# Patient Record
Sex: Female | Born: 1975 | Race: White | Hispanic: No | Marital: Single | Smoking: Never smoker
Health system: Southern US, Community
[De-identification: ages and names within clinical notes are randomized; demographics above are authoritative.]

## PROBLEM LIST (undated history)

## (undated) VITALS — BP 109/79 | HR 93 | Temp 98.0°F | Resp 16 | Ht 63.0 in | Wt 174.2 lb

## (undated) DIAGNOSIS — K7689 Other specified diseases of liver: Secondary | ICD-10-CM

## (undated) DIAGNOSIS — M1990 Unspecified osteoarthritis, unspecified site: Secondary | ICD-10-CM

## (undated) DIAGNOSIS — R51 Headache: Secondary | ICD-10-CM

## (undated) DIAGNOSIS — R569 Unspecified convulsions: Secondary | ICD-10-CM

## (undated) DIAGNOSIS — Z8041 Family history of malignant neoplasm of ovary: Secondary | ICD-10-CM

## (undated) DIAGNOSIS — G43909 Migraine, unspecified, not intractable, without status migrainosus: Secondary | ICD-10-CM

## (undated) DIAGNOSIS — F4310 Post-traumatic stress disorder, unspecified: Secondary | ICD-10-CM

## (undated) DIAGNOSIS — Z800 Family history of malignant neoplasm of digestive organs: Secondary | ICD-10-CM

## (undated) DIAGNOSIS — F419 Anxiety disorder, unspecified: Secondary | ICD-10-CM

## (undated) DIAGNOSIS — E079 Disorder of thyroid, unspecified: Secondary | ICD-10-CM

## (undated) DIAGNOSIS — S069X9A Unspecified intracranial injury with loss of consciousness of unspecified duration, initial encounter: Secondary | ICD-10-CM

## (undated) DIAGNOSIS — F319 Bipolar disorder, unspecified: Secondary | ICD-10-CM

## (undated) DIAGNOSIS — M810 Age-related osteoporosis without current pathological fracture: Secondary | ICD-10-CM

## (undated) DIAGNOSIS — I10 Essential (primary) hypertension: Secondary | ICD-10-CM

## (undated) DIAGNOSIS — G4700 Insomnia, unspecified: Secondary | ICD-10-CM

## (undated) DIAGNOSIS — G40909 Epilepsy, unspecified, not intractable, without status epilepticus: Secondary | ICD-10-CM

## (undated) DIAGNOSIS — K760 Fatty (change of) liver, not elsewhere classified: Secondary | ICD-10-CM

## (undated) DIAGNOSIS — Z87442 Personal history of urinary calculi: Secondary | ICD-10-CM

## (undated) DIAGNOSIS — G894 Chronic pain syndrome: Secondary | ICD-10-CM

## (undated) DIAGNOSIS — S069XAA Unspecified intracranial injury with loss of consciousness status unknown, initial encounter: Secondary | ICD-10-CM

## (undated) DIAGNOSIS — Z801 Family history of malignant neoplasm of trachea, bronchus and lung: Secondary | ICD-10-CM

## (undated) DIAGNOSIS — N6022 Fibroadenosis of left breast: Secondary | ICD-10-CM

## (undated) DIAGNOSIS — Z8719 Personal history of other diseases of the digestive system: Secondary | ICD-10-CM

## (undated) DIAGNOSIS — M797 Fibromyalgia: Secondary | ICD-10-CM

## (undated) DIAGNOSIS — G4730 Sleep apnea, unspecified: Secondary | ICD-10-CM

## (undated) DIAGNOSIS — K219 Gastro-esophageal reflux disease without esophagitis: Secondary | ICD-10-CM

## (undated) DIAGNOSIS — R87629 Unspecified abnormal cytological findings in specimens from vagina: Secondary | ICD-10-CM

## (undated) DIAGNOSIS — R519 Headache, unspecified: Secondary | ICD-10-CM

## (undated) HISTORY — PX: HEAD HARDWARE REMOVAL: SUR1126

## (undated) HISTORY — DX: Anxiety disorder, unspecified: F41.9

## (undated) HISTORY — DX: Migraine, unspecified, not intractable, without status migrainosus: G43.909

## (undated) HISTORY — PX: OTHER SURGICAL HISTORY: SHX169

## (undated) HISTORY — DX: Fibromyalgia: M79.7

## (undated) HISTORY — DX: Bipolar disorder, unspecified: F31.9

## (undated) HISTORY — DX: Post-traumatic stress disorder, unspecified: F43.10

## (undated) HISTORY — DX: Insomnia, unspecified: G47.00

## (undated) HISTORY — PX: TUBAL LIGATION: SHX77

## (undated) HISTORY — DX: Headache: R51

## (undated) HISTORY — DX: Family history of malignant neoplasm of trachea, bronchus and lung: Z80.1

## (undated) HISTORY — DX: Unspecified abnormal cytological findings in specimens from vagina: R87.629

## (undated) HISTORY — DX: Family history of malignant neoplasm of digestive organs: Z80.0

## (undated) HISTORY — DX: Family history of malignant neoplasm of ovary: Z80.41

## (undated) HISTORY — PX: WISDOM TOOTH EXTRACTION: SHX21

## (undated) HISTORY — DX: Chronic pain syndrome: G89.4

## (undated) HISTORY — DX: Other specified diseases of liver: K76.89

## (undated) HISTORY — DX: Fatty (change of) liver, not elsewhere classified: K76.0

## (undated) HISTORY — PX: DILATION AND CURETTAGE OF UTERUS: SHX78

## (undated) HISTORY — DX: Fibroadenosis of left breast: N60.22

## (undated) HISTORY — DX: Headache, unspecified: R51.9

## (undated) HISTORY — PX: BREAST SURGERY: SHX581

## (undated) HISTORY — PX: ABDOMINAL HYSTERECTOMY: SHX81

---

## 1997-07-19 ENCOUNTER — Other Ambulatory Visit: Admission: RE | Admit: 1997-07-19 | Discharge: 1997-07-19 | Payer: Self-pay | Admitting: Obstetrics & Gynecology

## 1997-10-29 ENCOUNTER — Inpatient Hospital Stay (HOSPITAL_COMMUNITY): Admission: AD | Admit: 1997-10-29 | Discharge: 1997-10-31 | Payer: Self-pay | Admitting: Obstetrics and Gynecology

## 1997-11-07 ENCOUNTER — Encounter (HOSPITAL_COMMUNITY): Admission: RE | Admit: 1997-11-07 | Discharge: 1998-02-05 | Payer: Self-pay | Admitting: Obstetrics and Gynecology

## 1998-06-18 ENCOUNTER — Inpatient Hospital Stay (HOSPITAL_COMMUNITY): Admission: AD | Admit: 1998-06-18 | Discharge: 1998-06-18 | Payer: Self-pay | Admitting: Obstetrics & Gynecology

## 1998-06-19 ENCOUNTER — Encounter: Payer: Self-pay | Admitting: Obstetrics & Gynecology

## 1998-06-24 ENCOUNTER — Inpatient Hospital Stay (HOSPITAL_COMMUNITY): Admission: AD | Admit: 1998-06-24 | Discharge: 1998-06-24 | Payer: Self-pay | Admitting: Obstetrics and Gynecology

## 1998-06-25 ENCOUNTER — Other Ambulatory Visit: Admission: RE | Admit: 1998-06-25 | Discharge: 1998-06-25 | Payer: Self-pay | Admitting: Obstetrics and Gynecology

## 1998-10-06 ENCOUNTER — Inpatient Hospital Stay (HOSPITAL_COMMUNITY): Admission: AD | Admit: 1998-10-06 | Discharge: 1998-10-06 | Payer: Self-pay | Admitting: Obstetrics and Gynecology

## 1998-12-16 ENCOUNTER — Inpatient Hospital Stay (HOSPITAL_COMMUNITY): Admission: AD | Admit: 1998-12-16 | Discharge: 1998-12-18 | Payer: Self-pay | Admitting: Obstetrics and Gynecology

## 1998-12-18 ENCOUNTER — Encounter: Payer: Self-pay | Admitting: Obstetrics & Gynecology

## 1998-12-27 ENCOUNTER — Inpatient Hospital Stay (HOSPITAL_COMMUNITY): Admission: AD | Admit: 1998-12-27 | Discharge: 1998-12-27 | Payer: Self-pay | Admitting: Obstetrics and Gynecology

## 1999-01-07 ENCOUNTER — Inpatient Hospital Stay (HOSPITAL_COMMUNITY): Admission: AD | Admit: 1999-01-07 | Discharge: 1999-01-09 | Payer: Self-pay | Admitting: Obstetrics and Gynecology

## 1999-01-08 ENCOUNTER — Encounter: Payer: Self-pay | Admitting: Obstetrics & Gynecology

## 1999-01-16 ENCOUNTER — Observation Stay (HOSPITAL_COMMUNITY): Admission: AD | Admit: 1999-01-16 | Discharge: 1999-01-17 | Payer: Self-pay | Admitting: Obstetrics & Gynecology

## 1999-01-27 ENCOUNTER — Inpatient Hospital Stay (HOSPITAL_COMMUNITY): Admission: AD | Admit: 1999-01-27 | Discharge: 1999-01-29 | Payer: Self-pay | Admitting: Obstetrics and Gynecology

## 1999-05-27 ENCOUNTER — Ambulatory Visit (HOSPITAL_COMMUNITY): Admission: RE | Admit: 1999-05-27 | Discharge: 1999-05-27 | Payer: Self-pay | Admitting: Obstetrics and Gynecology

## 2000-09-22 ENCOUNTER — Other Ambulatory Visit: Admission: RE | Admit: 2000-09-22 | Discharge: 2000-09-22 | Payer: Self-pay | Admitting: Obstetrics and Gynecology

## 2000-12-28 ENCOUNTER — Inpatient Hospital Stay (HOSPITAL_COMMUNITY): Admission: AD | Admit: 2000-12-28 | Discharge: 2000-12-30 | Payer: Self-pay | Admitting: Psychiatry

## 2001-11-25 ENCOUNTER — Other Ambulatory Visit: Admission: RE | Admit: 2001-11-25 | Discharge: 2001-11-25 | Payer: Self-pay | Admitting: Obstetrics and Gynecology

## 2004-01-25 ENCOUNTER — Other Ambulatory Visit: Admission: RE | Admit: 2004-01-25 | Discharge: 2004-01-25 | Payer: Self-pay | Admitting: Obstetrics and Gynecology

## 2004-04-27 ENCOUNTER — Emergency Department (HOSPITAL_COMMUNITY): Admission: EM | Admit: 2004-04-27 | Discharge: 2004-04-27 | Payer: Self-pay | Admitting: Emergency Medicine

## 2005-06-22 ENCOUNTER — Encounter: Admission: RE | Admit: 2005-06-22 | Discharge: 2005-06-22 | Payer: Self-pay | Admitting: Family Medicine

## 2005-07-08 ENCOUNTER — Encounter: Admission: RE | Admit: 2005-07-08 | Discharge: 2005-07-08 | Payer: Self-pay | Admitting: Family Medicine

## 2006-04-29 ENCOUNTER — Emergency Department (HOSPITAL_COMMUNITY): Admission: EM | Admit: 2006-04-29 | Discharge: 2006-04-29 | Payer: Self-pay | Admitting: Emergency Medicine

## 2006-06-16 ENCOUNTER — Ambulatory Visit: Payer: Self-pay | Admitting: Oncology

## 2007-01-05 ENCOUNTER — Emergency Department (HOSPITAL_COMMUNITY): Admission: EM | Admit: 2007-01-05 | Discharge: 2007-01-05 | Payer: Self-pay | Admitting: Emergency Medicine

## 2007-01-17 ENCOUNTER — Encounter: Admission: RE | Admit: 2007-01-17 | Discharge: 2007-01-17 | Payer: Self-pay | Admitting: Family Medicine

## 2007-01-31 ENCOUNTER — Ambulatory Visit: Payer: Self-pay | Admitting: Internal Medicine

## 2007-01-31 LAB — CONVERTED CEMR LAB
Amylase: 52 units/L (ref 27–131)
Lipase: 31 units/L (ref 11.0–59.0)
Sed Rate: 5 mm/hr (ref 0–25)

## 2007-03-08 ENCOUNTER — Ambulatory Visit: Payer: Self-pay | Admitting: Internal Medicine

## 2007-03-08 ENCOUNTER — Encounter: Payer: Self-pay | Admitting: Internal Medicine

## 2007-07-21 ENCOUNTER — Emergency Department (HOSPITAL_COMMUNITY): Admission: EM | Admit: 2007-07-21 | Discharge: 2007-07-21 | Payer: Self-pay | Admitting: Family Medicine

## 2007-08-23 ENCOUNTER — Emergency Department (HOSPITAL_COMMUNITY): Admission: EM | Admit: 2007-08-23 | Discharge: 2007-08-23 | Payer: Self-pay | Admitting: Emergency Medicine

## 2008-06-11 ENCOUNTER — Inpatient Hospital Stay (HOSPITAL_COMMUNITY): Admission: AD | Admit: 2008-06-11 | Discharge: 2008-06-12 | Payer: Self-pay | Admitting: Obstetrics & Gynecology

## 2008-06-11 ENCOUNTER — Emergency Department (HOSPITAL_COMMUNITY): Admission: EM | Admit: 2008-06-11 | Discharge: 2008-06-11 | Payer: Self-pay | Admitting: Emergency Medicine

## 2008-09-25 ENCOUNTER — Emergency Department (HOSPITAL_COMMUNITY): Admission: EM | Admit: 2008-09-25 | Discharge: 2008-09-25 | Payer: Self-pay | Admitting: Family Medicine

## 2008-10-29 ENCOUNTER — Emergency Department (HOSPITAL_COMMUNITY): Admission: EM | Admit: 2008-10-29 | Discharge: 2008-10-29 | Payer: Self-pay | Admitting: Emergency Medicine

## 2008-10-29 ENCOUNTER — Emergency Department (HOSPITAL_COMMUNITY): Admission: EM | Admit: 2008-10-29 | Discharge: 2008-10-30 | Payer: Self-pay | Admitting: Emergency Medicine

## 2008-12-31 ENCOUNTER — Ambulatory Visit: Payer: Self-pay

## 2008-12-31 ENCOUNTER — Encounter: Payer: Self-pay | Admitting: Cardiology

## 2009-01-04 ENCOUNTER — Inpatient Hospital Stay (HOSPITAL_COMMUNITY): Admission: RE | Admit: 2009-01-04 | Discharge: 2009-01-05 | Payer: Self-pay | Admitting: Neurosurgery

## 2009-01-04 ENCOUNTER — Telehealth (INDEPENDENT_AMBULATORY_CARE_PROVIDER_SITE_OTHER): Payer: Self-pay | Admitting: *Deleted

## 2009-01-05 ENCOUNTER — Encounter (INDEPENDENT_AMBULATORY_CARE_PROVIDER_SITE_OTHER): Payer: Self-pay | Admitting: Neurosurgery

## 2010-02-16 HISTORY — PX: OTHER SURGICAL HISTORY: SHX169

## 2010-05-21 LAB — BASIC METABOLIC PANEL
BUN: 9 mg/dL (ref 6–23)
CO2: 25 mEq/L (ref 19–32)
GFR calc non Af Amer: >60 mL/min (ref >60)
Potassium: 4 mEq/L (ref 3.5–5.1)
Sodium: 136 mEq/L (ref 135–145)

## 2010-05-21 LAB — CBC
Hemoglobin: 14.7 g/dL (ref 12.0–15.0)
RBC: 4.41 MIL/uL (ref 3.87–5.11)
RDW: 12.1 % (ref 11.5–15.5)

## 2010-05-23 LAB — VALPROIC ACID LEVEL: Valproic Acid Lvl: <10 ug/mL — ABNORMAL LOW (ref 50.0–100.0)

## 2010-05-24 LAB — POCT URINALYSIS DIP (DEVICE)
Glucose, UA: NEGATIVE mg/dL
Ketones, ur: NEGATIVE mg/dL
Nitrite: NEGATIVE

## 2010-05-24 LAB — POCT PREGNANCY, URINE: Preg Test, Ur: NEGATIVE

## 2010-05-24 LAB — URINE CULTURE

## 2010-05-28 LAB — GC/CHLAMYDIA PROBE AMP, GENITAL
Chlamydia, DNA Probe: NEGATIVE
GC Probe Amp, Genital: NEGATIVE

## 2010-05-28 LAB — URINALYSIS, ROUTINE W REFLEX MICROSCOPIC
Bilirubin Urine: NEGATIVE
Glucose, UA: NEGATIVE mg/dL
Leukocytes, UA: NEGATIVE
Specific Gravity, Urine: >1.03 — ABNORMAL HIGH (ref 1.005–1.030)
Urobilinogen, UA: 0.2 mg/dL (ref 0.0–1.0)
pH: 5.5 (ref 5.0–8.0)

## 2010-05-28 LAB — CBC
HCT: 41.6 % (ref 36.0–46.0)
Platelets: 210 10*3/uL (ref 150–400)
RBC: 4.36 MIL/uL (ref 3.87–5.11)
WBC: 11.7 10*3/uL — ABNORMAL HIGH (ref 4.0–10.5)

## 2010-05-28 LAB — POCT URINALYSIS DIP (DEVICE)
Ketones, ur: NEGATIVE mg/dL
Nitrite: NEGATIVE
Protein, ur: 30 mg/dL — AB
Specific Gravity, Urine: 1.025 (ref 1.005–1.030)

## 2010-05-28 LAB — WET PREP, GENITAL
Clue Cells Wet Prep HPF POC: NONE SEEN
Yeast Wet Prep HPF POC: NONE SEEN

## 2010-05-28 LAB — DIFFERENTIAL
Basophils Absolute: 0 10*3/uL (ref 0.0–0.1)
Eosinophils Relative: 1 % (ref 0–5)
Lymphocytes Relative: 28 % (ref 12–46)

## 2010-05-28 LAB — BASIC METABOLIC PANEL
Calcium: 9.5 mg/dL (ref 8.4–10.5)
Creatinine, Ser: 0.72 mg/dL (ref 0.4–1.2)
GFR calc Af Amer: >60 mL/min (ref >60)
GFR calc non Af Amer: >60 mL/min (ref >60)
Potassium: 3.9 mEq/L (ref 3.5–5.1)

## 2010-05-28 LAB — POCT PREGNANCY, URINE: Preg Test, Ur: NEGATIVE

## 2010-05-28 LAB — URINE MICROSCOPIC-ADD ON

## 2010-07-01 NOTE — Assessment & Plan Note (Signed)
[facility]                         GASTROENTEROLOGY OFFICE NOTE   NAME:STEPHENSCharlisha, Market                   MRN:          [id number]  DATE:01/31/2007                            DOB:          Apr 24, 1975    REFERRING PHYSICIAN:  Corky Mull   GASTROENTEROLOGY CONSULTATION   PROBLEM:  Epigastric pain, alternating bowel habits, nausea and  bloating.   HISTORY OF PRESENT ILLNESS:  Malayshia is a 35 year old white female  generally in good health.  She does have history of migraines and  bipolar disorder.  She is status post bilateral tubal ligation in 2000.  The patient has not had any previous gastrointestinal evaluation.  She  states that she has had GI problems intermittently over the past 8  years.  She said her difficulty starting in 2000 when she had an episode  with a lot of abdominal discomfort and mucous stools and some small  amounts of bright red blood per rectum.  She says intermittently over  the years she has had alternating diarrhea and constipation and  occasionally sees a small amount of bright red blood mixed in with her  bowel movements.  About three weeks ago, she said she had not been  bothered over the past year, she began having epigastric discomfort,  which she describes as stabbing and fairly severe.  This took her to the  emergency room where she was evaluated and released.  She has since been  to her primary care Esten Dollar and says that she continues to have the  epigastric discomfort, feels bloated and uncomfortable and feels as if  her food is not digesting.  She had labs drawn, CBC and CMET were  negative.  An H Pylori was done and was weakly positive and she is now  on a regimen of Flagyl, Biaxin and Prevacid b.i.d. which she started  three days ago.  She says she feels more nauseated with these  medications.  She, at this time, is not having chronic ongoing problems  with diarrhea but rather  alternating bowel habits.  She  had seen bright  red blood in her stools over the past couple of weeks but none over the  past few days.  She has actually gained weight since October of 2008.  The patient is on Depakote but has not had any recent changes in her  dosage.   A recent abdominal ultrasound December, 2008 was negative.   CURRENT MEDICATIONS:  1. Depakote 500 mg two p.o. q.h.s.  2. Phenergan 25 mg q.6h. p.r.n.  3. Flagyl 250, two p.o. b.i.d. x 14 days.  4. Prevacid 30 b.i.d. x 14 days.  5. Clarithromycin 500 mg one p.o. b.i.d. x 14 days.   ALLERGIES:  PENICILLIN and MORPHINE.   PAST MEDICAL HISTORY:  As outlined above.   SOCIAL HISTORY:  The patient has two sons.  She is currently separated  and living with her parents.  She is an Systems developer.  She is a  nonsmoker, nondrinker.   FAMILY HISTORY:  Positive for ovarian cancer in grandmother, mother and  aunt.  Alcoholism in her mother.   REVIEW  OF SYSTEMS:  Pertinent for urinary frequency, low back pain, she  says that she does have difficulty with sleeping she relates to her  depression.   PHYSICAL EXAMINATION:  GENERAL APPEARANCE:  Well-developed, white female  in no acute distress.  VITAL SIGNS:  Height is 5 feet, 4 inches, weight is 151.  Blood pressure  108/72.  Pulse is 68.  HEENT:  Nontraumatic, normocephalic.  EOMI. PERRLA.  Sclerae anicteric.  NECK:  Supple.  CARDIOVASCULAR:  Regular rate and rhythm with S1, S2.  No murmurs, rubs  or gallops.  PULMONARY:  Clear to auscultation and percussion.  ABDOMEN: Soft.  She is tender in the epigastrium, right upper quadrant,  right mid quadrant.  There is no palpable mass or hepatosplenomegaly.  No guarding or rebound.  RECTAL:  Exam is Hemoccult negative without mass.  Formed stool in the  rectal vault.   IMPRESSION:  This is a 35 year old white female with three week history  of epigastric pain associated with cramping, nausea and a long history  of intermittent upper abdominal pain,  diarrhea and hematochezia.  Her  symptoms may all be due to irritable bowel syndrome, though need to rule  out inflammatory bowel disease, Celiac disease, also would consider  pancreatitis currently since she is on Depakote, though recent  ultrasound did not show any inflammatory changes.   PLAN:  1. Check labs, sed rate, amylase, lipase and Celiac panel today.  2. Advised her to finish her current course of H. Pylori therapy,      though do not feel that H. Pylori accounts for the majority of her      symptoms.  She was also given samples of Prevacid to continue once      she completes the two week H. Pylori regimen.  3. Schedule upper endoscopy and colonoscopy with Dr. Juanda Chance to help      clarify her diagnosis.      Mike Gip, PA-C  Electronically Signed      Hedwig Morton. Juanda Chance, MD  Electronically Signed   AE/MedQ  DD: 01/31/2007  DT: 02/01/2007  Job #: [id number]

## 2010-07-04 NOTE — Op Note (Signed)
[facility]  Patient:    Kristin Braun, Kristin Braun                      MRN: [id number] Proc. Date: 05/27/99 Adm. Date:  [id number] Attending:  Osborn Coho                           Operative Report  PREOPERATIVE DIAGNOSIS:       The patient desires permanent sterilization.  POSTOPERATIVE DIAGNOSIS:      The patient desires permanent sterilization.  OPERATION:                    Laparoscopic bilateral tubal ligation, application of Hulka clips.  SURGEON:                      Mark E. Dareen Piano, M.D.  ASSISTANT:  ANESTHESIA:                   General endotracheal anesthesia.  ANTIBIOTICS:                  None.  DRAINS:                       Red rubber catheter to bladder.  ESTIMATED BLOOD LOSS:         Minimal.  COMPLICATIONS:                None.  DESCRIPTION OF PROCEDURE:     The patient was taken to the operating room where a general anesthesia was administered without complications.  She was then placed in the dorsal lithotomy position and prepped with Hibiclens.  Her bladder was drained with a red rubber catheter.  A Hulka tenaculum was applied to the anterior cervical lip.  6 cc of 0.25% Marcaine was injected into the infraumbilical region.  A vertical skin incision was made.  The Veress needle was placed in the peritoneal cavity and 3 liters of carbon dioxide was insufflated.  The 12 mm trocar was then placed in the peritoneal cavity and the scope placed in the abdominal cavity. There problems with the camera.  I could not get it working and therefore, this  procedure was done without a camera.  A Hulka clip was applied in the isthmic portion of the left fallopian tube.  The clip appeared to be tightly clasped. t appeared to be perpendicular to the tube.  The entire tube appeared to be within the clasp.  A similar procedure was performed on the opposite side.  At this point, the scope was removed and the pneumoperitoneum  released.  The fascia was closed  with interrupted 0 Vicryl suture, the skin incision closed with interrupted 4-0  Vicryl suture.  The patient tolerated the procedure well and she was taken to the recovery room in stable condition.  She will be discharged to home.  She will be instructed to follow up in the office in four weeks.  She was sent home with Tylox to take p.r.n. DD:  05/27/99 TD:  05/27/99 Job: [id number]

## 2010-07-04 NOTE — Discharge Summary (Signed)
[facility]  Patient:    Kristin Braun, Kristin Braun Visit Number: [id number] MRN: [id number]          Service Type: PSY Location: 500 9629 01 Attending Physician:  Rachael Fee Dictated by:   Jeanice Lim, M.D. Admit Date:  12/28/2000 Discharge Date: 12/30/2000                             Discharge Summary  IDENTIFYING DATA:  This is a 35 year old separated Caucasian female admitted voluntarily for depression and suicidal ideation had a history of depression and suicidal thoughts, unable to contract for safety prior to admission, had been irritable, angry, experiencing mood swings and described vague paranoid ideation.  PAST PSYCHIATRIC HISTORY:  First psychiatric hospitalization to [facility].  No history of suicide attempts.  DRUG ALLERGIES:  PENICILLIN and MORPHINE.  MEDICATIONS:  Zoloft 150 mg (stopped it three months ago, had been effective). Was on Celexa in the past.  PHYSICAL EXAMINATION:  Unremarkable.  Neurologically nonfocal.  MENTAL STATUS EXAMINATION:  Alert and oriented.  Dressed casually, cooperative with good eye contact.  Speech normal, mildly rapid but not pressured.  Mood mildly depressed and sad.  Affect full.  Thought process goal directed. Thought content negative for auditory or visual hallucinations.  No suicidal or homicidal ideation.  Paranoid ideation had resolved since admission. Cognitively intact.  Judgment and insight appeared to be good by formal testing.  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 Axis II:   None. Axis III:  None. Axis IV:   Mild (problems with primary support group). Axis V:    30/75.  LABORATORY DATA:  Routine labs were essentially within normal limits.  HOSPITAL COURSE:  The patient was initiated on Zyprexa and Lexapro and Lexapro was optimized.  She reported good tolerance to medications without side effects and resolution  of psychotic symptoms.  The patient clearly described depressive symptoms worsening after stopping Zoloft, which had partially been effective.  She had denied any clear history of manic symptoms, admitted to paranoid ideation only, which evolved as depression worsened.  She described good support system.  Mother extremely supportive.  Father, of two children, supportive and apparently will care for the children until she is feeling better.  She requested discharge and family was supportive of this.  She demonstrated good insight and judgment.  CONDITION ON DISCHARGE:  Improved.  Responding to crisis stabilization.  She had slept well.  Reported no psychotic symptoms.  No dangerous ideation and her mood was mostly euthymic with no evidence of mood swings.  She denied side effects with the medications and reported motivation to be compliant with following up in intensive outpatient program, which the family felt would be the most therapeutic.  They also agreed to supervise the patient as needed.  FOLLOW-UP:  The patient was discharged to follow up with the intensive outpatient program at [facility].  Appointment to start November 18 at 4 p.m.  DISCHARGE MEDICATIONS: 1. Zyprexa 7.5 mg q.h.s. 2. Lexapro 10 mg q.a.m.  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 Axis II:   None. Axis III:  None. Axis IV:   Mild (problems with primary support group). Axis V:    Global Assessment of Functioning on discharge 55. Dictated by:   Jeanice Lim, M.D. Attending Physician:  Rachael Fee DD:  02/02/01 TD:  02/04/01 Job: [id number]

## 2010-07-04 NOTE — H&P (Signed)
[facility]  Patient:    Kristin Braun, Kristin Braun Visit Number: [id number] MRN: [id number]          Service Type: PSY Location: 500 6213 01 Attending Physician:  Rachael Fee Dictated by:   Candi Leash. Orsini, N.P. Admit Date:  12/28/2000                     Psychiatric Admission Assessment  PATIENT IDENTIFICATION:  This is a 35 year old separated white female admitted on a voluntary basis for depression and suicidal ideation.  HISTORY OF PRESENT ILLNESS:  The patient presents with a history of depression and suicidal thoughts.  The patient was unable to contract for safety.  The patient has been increasingly depressed since her separation from her husband, feeling very overwhelmed caring for her children, was having thoughts about hurting herself.  The patient was feeling very irritable and angry and then becoming super happy, experiencing wide mood swings.  The patient reports no sleep for the past two to three nights with difficulty calming her mind, reports a decreased appetite with a 20 pound weight loss, reports auditory hallucinations before her separation and having paranoid ideation that had intensified on the day of admission feeling as if people were watching and talking about her.  She reports she feels very safe here, is able to interact and denies any psychotic symptoms at present, denies any current suicidal or homicidal ideation.  PAST PSYCHIATRIC HISTORY:  First hospitalization to [facility], no other hospitalizations, no outpatient treatment, no prior suicide attempts.  SUBSTANCE ABUSE HISTORY:  She is a nonsmoker.  She states she would drink sometimes on the weekend but states it is not a problem.  Denies any substance abuse.  PAST MEDICAL HISTORY:  Primary care [place]: The patient attends [employer].  Medical problems: None.  MEDICATIONS:  The patient was on Zoloft 150 mg, stopped that about three months ago.   She states it was effective, was on that for about one and a half years.  Was on Celexa in the past but discontinued that because of weight gain.  DRUG ALLERGIES:  PENICILLIN and MORPHINE.  REVIEW OF SYSTEMS:  The patient denies any fever or chills.  Reports a 20 pound weight loss.  Reports some blurred vision, states she needs her prescription for glasses redone.  No hearing loss.  Occasional sinus infection.  No chest pain or palpitations.  Feels like her heart races when she has panic attacks.  RESPIRATORY: Nonsmoker, denies any cough or shortness of breath.  GI: Reports some past blood in the stool and has alternating problems with constipation and loose stools.  GU: No dysuria, frequency, or hematuria.  MUSCULOSKELETAL: No stiffness, swelling, or joint pain.  SKIN: No redness, has some dry skin to both her upper arms.  NEUROLOGIC: No weakness, spasms, or memory loss.  History of headaches.  PSYCHIATRIC: History of depression with some suicidal thoughts.  ENDOCRINE: No thyroid or diabetic problems.  LYMPHATIC: No enlarged or tender nodes.  ALLERGIES: No environmental allergies.  PHYSICAL EXAMINATION:  VITAL SIGNS:  The patient is 135 pounds.  She is 5 feet 5 inches.  Temperature 98.4, pulse 81, respirations 12, blood pressure 128/66.  GENERAL:  The patient is a 35 year old Caucasian female in no acute distress. She is well-developed, mildly overweight.  She is well-groomed, alert, and cooperative.  HEENT:  Head is normocephalic.  She can raise her eyebrows.  Hair is evenly distributed, clean.  EOMs are intact bilaterally.  External ear canals are patent.  No sinus tenderness, no nasal discharge.  Hearing is appropriate to conversation.  Mucosa is moist with good dentition, no lesions were seen. Tongue protrudes to midline without tremor.  She can clench her teeth and puff out her cheeks.  No pharyngeal exudate.  NECK:  Supple, no JVD, negative lymphadenopathy.  Thyroid is  nonpalpable and nontender.  Trachea is midline.  CHEST:  Clear to auscultation, no adventitious sounds.  CARDIOVASCULAR:  Heart is regular rate and rhythm without murmurs, rubs, or gallops.  Carotid pulses are equal and adequate.  No edema was noted.  BREAST:  Exam was deferred.  ABDOMEN:  Soft, nontender abdomen, no CVA tenderness.  MUSCULOSKELETAL:  No joint swelling or deformity.  Good range of motion. Muscle strength and tone is equal bilaterally.  No signs of injury.  SKIN:  Warm and dry with good turgor.  Nail beds are pink with good capillary refill.  Strong bilateral radial pulses.  No lacerations.  Some dryness to both upper arms.  NEUROLOGIC:  Oriented x 3.  Cranial nerves are grossly intact.  Good grip strength bilaterally.  No involuntary movements.  Gait is normal.  Cerebellar function is intact with heel-to-shin and normal alternating movements. Romberg is negative.  Health maintenance issues were addressed.  SOCIAL HISTORY:  She is a 35 year old separated white female.  This is her second marriage.  She was married for three years, separated for six months. She has two children a 27-year-old from her first husband and a 1-year-old from the second marriage.  She works full-time at an [employer], dispensing.  She states she enjoys her work.  She has no legal problems.  At present, her ex-husband has both of her children and she reports a supportive family and work environment.  FAMILY HISTORY:  Mother who she is states is "moody" and problems with alcohol.  MENTAL STATUS EXAMINATION:  She is an alert, young Caucasian female.  She is dressed casually.  She is cooperative with good eye contact.  Speech is normal, somewhat rapid but relevant, not pressured.  Mood is depressed. Affect is sad.  Thought processes: Denies any current auditory or visual hallucinations, no suicidal or homicidal ideations.  Paranoid ideations has decreased since admission.  Cognitive: Intact.   Judgment is fair.  Insight is fair.  Oriented x 3.  Memory is good.  ADMISSION DIAGNOSES:  Axis I:    1. Major depression, recurrent with psychotic features.            2. Rule out bipolar disorder with psychotic features. Axis II:   Deferred. Axis III:  None. Axis IV:   Problems with primary support group and other psychosocial            problems. Axis V:    Current is 30, estimated this past year is 75.  INITIAL PLAN OF CARE:  Plan is a voluntary admission to [facility] for depression and suicidal ideation.  Contract for safety.  Check every 15 minutes.  Will obtain labs.  Will initiate Zyprexa for sleep, mood swings, and psychosis.  Will initiate an antidepressant to decrease depressive symptoms.  Will have Ativan available for anxiety.  Goal is to stabilize her mood and thinking so the patient can be safe, for the patient to attend the IOP Program and then [facility] for outpatient services.  ESTIMATED LENGTH OF STAY:  Three to five days. Dictated by:   Candi Leash. Orsini, N.P. Attending Physician:  Geoffery Lyons A DD:  12/29/00 TD:  12/29/00 Job: [id number]

## 2010-10-11 ENCOUNTER — Inpatient Hospital Stay (INDEPENDENT_AMBULATORY_CARE_PROVIDER_SITE_OTHER)
Admission: RE | Admit: 2010-10-11 | Discharge: 2010-10-11 | Disposition: A | Discharge status: Home / Self Care | Payer: Self-pay | Source: Ambulatory Visit | Attending: Family Medicine | Admitting: Family Medicine

## 2010-10-11 DIAGNOSIS — T148XXA Other injury of unspecified body region, initial encounter: Secondary | ICD-10-CM

## 2010-11-13 LAB — CBC
HCT: 41.5
Hemoglobin: 14.2
MCHC: 34.2
RBC: 4.48

## 2010-11-13 LAB — DIFFERENTIAL
Lymphocytes Relative: 12
Monocytes Absolute: 0.7
Monocytes Relative: 5
Neutro Abs: 11.9 — ABNORMAL HIGH

## 2010-11-13 LAB — POCT I-STAT, CHEM 8
BUN: 12
Calcium, Ion: 1.12
Chloride: 101
Creatinine, Ser: 1.1
Glucose, Bld: 80

## 2010-11-25 LAB — CBC
HCT: 43.5
MCHC: 33.5
MCV: 92.7
Platelets: 217
RDW: 12.9

## 2010-11-25 LAB — DIFFERENTIAL
Basophils Absolute: 0
Lymphocytes Relative: 36
Monocytes Absolute: 0.5
Neutro Abs: 5.3

## 2010-11-25 LAB — URINALYSIS, ROUTINE W REFLEX MICROSCOPIC
Hgb urine dipstick: NEGATIVE
Nitrite: NEGATIVE
Specific Gravity, Urine: 1.022
Urobilinogen, UA: 1

## 2010-11-25 LAB — URINE MICROSCOPIC-ADD ON

## 2010-11-25 LAB — COMPREHENSIVE METABOLIC PANEL
Albumin: 3.8
BUN: 13
Chloride: 103
Creatinine, Ser: 0.72
Total Bilirubin: 1.2
Total Protein: 6.7

## 2011-07-25 ENCOUNTER — Emergency Department (HOSPITAL_COMMUNITY): Payer: Self-pay

## 2011-07-25 ENCOUNTER — Emergency Department (HOSPITAL_COMMUNITY)
Admission: EM | Admit: 2011-07-25 | Discharge: 2011-07-25 | Disposition: A | Discharge status: Home / Self Care | Payer: Self-pay | Attending: Emergency Medicine | Admitting: Emergency Medicine

## 2011-07-25 ENCOUNTER — Encounter (HOSPITAL_COMMUNITY): Payer: Self-pay | Admitting: *Deleted

## 2011-07-25 DIAGNOSIS — R079 Chest pain, unspecified: Secondary | ICD-10-CM | POA: Insufficient documentation

## 2011-07-25 DIAGNOSIS — R110 Nausea: Secondary | ICD-10-CM | POA: Insufficient documentation

## 2011-07-25 DIAGNOSIS — F1410 Cocaine abuse, uncomplicated: Secondary | ICD-10-CM | POA: Insufficient documentation

## 2011-07-25 DIAGNOSIS — M79602 Pain in left arm: Secondary | ICD-10-CM

## 2011-07-25 DIAGNOSIS — F1510 Other stimulant abuse, uncomplicated: Secondary | ICD-10-CM | POA: Insufficient documentation

## 2011-07-25 DIAGNOSIS — F1310 Sedative, hypnotic or anxiolytic abuse, uncomplicated: Secondary | ICD-10-CM | POA: Insufficient documentation

## 2011-07-25 DIAGNOSIS — R002 Palpitations: Secondary | ICD-10-CM | POA: Insufficient documentation

## 2011-07-25 DIAGNOSIS — M79609 Pain in unspecified limb: Secondary | ICD-10-CM | POA: Insufficient documentation

## 2011-07-25 HISTORY — DX: Unspecified convulsions: R56.9

## 2011-07-25 LAB — BASIC METABOLIC PANEL
Calcium: 9.4 mg/dL (ref 8.4–10.5)
Creatinine, Ser: 0.76 mg/dL (ref 0.50–1.10)
GFR calc non Af Amer: >90 mL/min (ref >90)
Sodium: 137 mEq/L (ref 135–145)

## 2011-07-25 LAB — CBC
MCH: 31.4 pg (ref 26.0–34.0)
MCHC: 35.5 g/dL (ref 30.0–36.0)
MCV: 88.7 fL (ref 78.0–100.0)
Platelets: 208 10*3/uL (ref 150–400)
RDW: 12 % (ref 11.5–15.5)
WBC: 7.8 10*3/uL (ref 4.0–10.5)

## 2011-07-25 LAB — DIFFERENTIAL
Basophils Absolute: 0 10*3/uL (ref 0.0–0.1)
Basophils Relative: 0 % (ref 0–1)
Eosinophils Absolute: 0.1 10*3/uL (ref 0.0–0.7)
Eosinophils Relative: 2 % (ref 0–5)

## 2011-07-25 LAB — TROPONIN I: Troponin I: <0.3 ng/mL (ref <0.30)

## 2011-07-25 MED ORDER — GI COCKTAIL ~~LOC~~
30.0000 mL | Freq: Once | ORAL | Status: AC
  Administered 2011-07-25: 30 mL via ORAL
  Filled 2011-07-25: qty 30

## 2011-07-25 MED ORDER — OXYCODONE-ACETAMINOPHEN 5-325 MG PO TABS: 1.0000 | ORAL_TABLET | ORAL | Status: AC | PRN

## 2011-07-25 MED ORDER — PANTOPRAZOLE SODIUM 40 MG IV SOLR
40.0000 mg | Freq: Once | INTRAVENOUS | Status: AC
  Administered 2011-07-25: 40 mg via INTRAVENOUS
  Filled 2011-07-25: qty 40

## 2011-07-25 MED ORDER — DIAZEPAM 5 MG PO TABS
5.0000 mg | ORAL_TABLET | Freq: Once | ORAL | Status: DC
  Filled 2011-07-25: qty 1

## 2011-07-25 MED ORDER — SODIUM CHLORIDE 0.9 % IV BOLUS (SEPSIS)
1000.0000 mL | Freq: Once | INTRAVENOUS | Status: AC
  Administered 2011-07-25: 1000 mL via INTRAVENOUS

## 2011-07-25 MED ORDER — OXYCODONE-ACETAMINOPHEN 5-325 MG PO TABS
1.0000 | ORAL_TABLET | Freq: Once | ORAL | Status: DC
  Filled 2011-07-25: qty 1

## 2011-07-25 MED ORDER — CYCLOBENZAPRINE HCL 5 MG PO TABS: 5.0000 mg | ORAL_TABLET | Freq: Three times a day (TID) | ORAL | Status: AC | PRN

## 2011-07-25 NOTE — ED Provider Notes (Signed)
History     CSN: [id number]  Arrival date & time 07/25/11  0236   First MD Initiated Contact with Patient 07/25/11 0255      Chief Complaint  Patient presents with  . Chest Pain    HPI  History provided by the patient. Patient is a 36 year old female with history of seizures who presents with concerns for left chest pain and left upper extremity pains. Patient reports that pain began to 3 hours prior to arrival. Pain is a pressure and burning sensation. Patient initially felt the chest pain was indigestion and tried some over-the-counter antacid medicines without relief. Pain is also soreness that radiates into the left neck, shoulder and upper extremity. Patient does mention she's had some right-sided neck pains for the past week. She denies any prior and neck pains for neck injuries. Patient does also report having poor sleep for the past few weeks. She reports only having a few hours of sleep nightly. Patient also reports having some recent drug abuse. Patient used cocaine 2 days ago. She is also used some street prescription medication such as Adderall and Xanax intermittently. Symptoms are described as moderate. She denies any other aggravating or alleviating factors. Patient denies any shortness of breath, pleuritic pain, hemoptysis, prior history of DVT or PE, history of recent surgery, history of recent estrogen use.    Past Medical History  Diagnosis Date  . Seizures     History reviewed. No pertinent past surgical history.  No family history on file.  History  Substance Use Topics  . Smoking status: Never Smoker   . Smokeless tobacco: Not on file  . Alcohol Use: Yes    OB History    Grav Para Term Preterm Abortions TAB SAB Ect Mult Living                  Review of Systems  Constitutional: Positive for diaphoresis. Negative for fever and chills.  Respiratory: Negative for shortness of breath.   Cardiovascular: Positive for chest pain and palpitations. Negative for  leg swelling.  Gastrointestinal: Positive for nausea.    Allergies  Penicillins and Morphine and related  Home Medications  No current outpatient prescriptions on file.  BP 141/94  Pulse 103  Temp(Src) 98.4 F (36.9 C) (Oral)  Resp 20  SpO2 99%  LMP 07/25/2011  Physical Exam  Nursing note and vitals reviewed. Constitutional: She is oriented to person, place, and time. She appears well-developed and well-nourished. No distress.  HENT:  Head: Normocephalic and atraumatic.  Neck: Normal range of motion. Neck supple.       Patient has some tenderness over this cervical spine. Is also pain over the left trapezius into the shoulder. This pain radiates to left fingers.  Cardiovascular: Normal rate and regular rhythm.   No murmur heard. Pulmonary/Chest: Effort normal and breath sounds normal. She has no wheezes. She has no rales.  Abdominal: Soft. There is tenderness in the epigastric area. There is guarding. There is no rebound and negative Murphy's sign.  Musculoskeletal: She exhibits no edema.  Neurological: She is alert and oriented to person, place, and time.  Skin: Skin is warm and dry. No rash noted.  Psychiatric: She has a normal mood and affect. Her behavior is normal.    ED Course  Procedures   Results for orders placed during the hospital encounter of 07/25/11  CBC      Component Value Range   WBC 7.8  4.0 - 10.5 (K/uL)   RBC  4.77  3.87 - 5.11 (MIL/uL)   Hemoglobin 15.0  12.0 - 15.0 (g/dL)   HCT 40.9  81.1 - 91.4 (%)   MCV 88.7  78.0 - 100.0 (fL)   MCH 31.4  26.0 - 34.0 (pg)   MCHC 35.5  30.0 - 36.0 (g/dL)   RDW 78.2  95.6 - 21.3 (%)   Platelets 208  150 - 400 (K/uL)  DIFFERENTIAL      Component Value Range   Neutrophils Relative 54  43 - 77 (%)   Neutro Abs 4.2  1.7 - 7.7 (K/uL)   Lymphocytes Relative 40  12 - 46 (%)   Lymphs Abs 3.1  0.7 - 4.0 (K/uL)   Monocytes Relative 4  3 - 12 (%)   Monocytes Absolute 0.3  0.1 - 1.0 (K/uL)   Eosinophils Relative 2  0  - 5 (%)   Eosinophils Absolute 0.1  0.0 - 0.7 (K/uL)   Basophils Relative 0  0 - 1 (%)   Basophils Absolute 0.0  0.0 - 0.1 (K/uL)  BASIC METABOLIC PANEL      Component Value Range   Sodium 137  135 - 145 (mEq/L)   Potassium 3.6  3.5 - 5.1 (mEq/L)   Chloride 104  96 - 112 (mEq/L)   CO2 20  19 - 32 (mEq/L)   Glucose, Bld 93  70 - 99 (mg/dL)   BUN 12  6 - 23 (mg/dL)   Creatinine, Ser 0.86  0.50 - 1.10 (mg/dL)   Calcium 9.4  8.4 - 57.8 (mg/dL)   GFR calc non Af Amer >90  >90 (mL/min)   GFR calc Af Amer >90  >90 (mL/min)  TROPONIN I      Component Value Range   Troponin I <0.30  <0.30 (ng/mL)      Dg Chest 2 View  07/25/2011  *RADIOLOGY REPORT*  Clinical Data: Chest pain  CHEST - 2 VIEW  Comparison: None.  Findings: Lungs are clear. No pleural effusion or pneumothorax. The cardiomediastinal contours are within normal limits. The visualized bones and soft tissues are without significant appreciable abnormality.  IMPRESSION: No radiographic evidence for acute cardiopulmonary process.  Original Report Authenticated By: Waneta Martins, M.D.   Dg Cervical Spine Complete  07/25/2011  *RADIOLOGY REPORT*  Clinical Data: Left arm pain and tingling  CERVICAL SPINE - COMPLETE 4+ VIEW  Comparison: None.  Findings: Mild multilevel degenerative changes, with facet arthropathy and disc height loss most pronounced at C5-6.  No acute fracture or dislocation.  No prevertebral soft tissue swelling. Lung apices are clear.  Maintained C1-2 articulation.  No dens fracture.  IMPRESSION: Mild multilevel degenerative changes, most pronounced at C5-6.  No acute osseous abnormality.  Original Report Authenticated By: Waneta Martins, M.D.     1. Left arm pain       MDM  Patient seen and evaluated. Patient no acute distress.  Pt was seen and evaluated with attending physician. Patient has pain with range of motion of left arm. Patient also seems to have point tenderness of biceps area. Other palpation of  the upper arm and trapezius does cause radiating pain to left hand and fingers. X-rays do show her some degenerative changes.    Labs are unremarkable.  Normal troponin and ECG.  Do not suspect ACS.      Date: 07/25/2011  Rate: 103  Rhythm: sinus tachycardia  QRS Axis: normal  Intervals: normal  ST/T Wave abnormalities: normal  Conduction Disutrbances:none  Narrative Interpretation:  Old EKG Reviewed: none available    Angus Seller, [place] 07/25/11 [phone]

## 2011-07-25 NOTE — ED Provider Notes (Signed)
Medical screening examination/treatment/procedure(s) were performed by non-physician practitioner and as supervising physician I was immediately available for consultation/collaboration.   Travonte Byard M Eriyanna Kofoed, MD 07/25/11 0614 

## 2011-07-25 NOTE — ED Notes (Signed)
Lt upper chest pain with some lt arm pain for the past 2 hours with some nausea.  She has only been sleeping for 2-3 hours for 2 weeks  And she has had some intermittent rt neck pain

## 2011-07-25 NOTE — ED Notes (Signed)
Dr. Patria Mane at bedside with pt.

## 2011-07-25 NOTE — ED Notes (Signed)
Pt stated that she begin having left arm burning starting tonight.  She said that it radiates to her left side of her chest  Under her breast. She sated that she took 4 baby ASA and then begin having indigestion and nausea. She also is having neck pain bilateral. Neck pain is tender to touch. She is now complaining of abdominal pain. No vomiting or loose stools. Abdomen is tender to touch. She states that she has taken Aterol, Cocaine, and Xanax earlier this week.

## 2012-03-17 ENCOUNTER — Emergency Department (HOSPITAL_COMMUNITY): Payer: Self-pay

## 2012-03-17 ENCOUNTER — Encounter (HOSPITAL_COMMUNITY): Payer: Self-pay | Admitting: Emergency Medicine

## 2012-03-17 ENCOUNTER — Emergency Department (HOSPITAL_COMMUNITY)
Admission: EM | Admit: 2012-03-17 | Discharge: 2012-03-18 | Disposition: A | Discharge status: Home / Self Care | Payer: Self-pay | Attending: Emergency Medicine | Admitting: Emergency Medicine

## 2012-03-17 DIAGNOSIS — S79919A Unspecified injury of unspecified hip, initial encounter: Secondary | ICD-10-CM | POA: Insufficient documentation

## 2012-03-17 DIAGNOSIS — Z8619 Personal history of other infectious and parasitic diseases: Secondary | ICD-10-CM | POA: Insufficient documentation

## 2012-03-17 DIAGNOSIS — Y929 Unspecified place or not applicable: Secondary | ICD-10-CM | POA: Insufficient documentation

## 2012-03-17 DIAGNOSIS — G40909 Epilepsy, unspecified, not intractable, without status epilepticus: Secondary | ICD-10-CM | POA: Insufficient documentation

## 2012-03-17 DIAGNOSIS — I10 Essential (primary) hypertension: Secondary | ICD-10-CM | POA: Insufficient documentation

## 2012-03-17 DIAGNOSIS — Z862 Personal history of diseases of the blood and blood-forming organs and certain disorders involving the immune mechanism: Secondary | ICD-10-CM | POA: Insufficient documentation

## 2012-03-17 DIAGNOSIS — Z79899 Other long term (current) drug therapy: Secondary | ICD-10-CM | POA: Insufficient documentation

## 2012-03-17 DIAGNOSIS — M25559 Pain in unspecified hip: Secondary | ICD-10-CM

## 2012-03-17 DIAGNOSIS — Z8739 Personal history of other diseases of the musculoskeletal system and connective tissue: Secondary | ICD-10-CM | POA: Insufficient documentation

## 2012-03-17 DIAGNOSIS — Z8639 Personal history of other endocrine, nutritional and metabolic disease: Secondary | ICD-10-CM | POA: Insufficient documentation

## 2012-03-17 DIAGNOSIS — R102 Pelvic and perineal pain: Secondary | ICD-10-CM

## 2012-03-17 DIAGNOSIS — W010XXA Fall on same level from slipping, tripping and stumbling without subsequent striking against object, initial encounter: Secondary | ICD-10-CM | POA: Insufficient documentation

## 2012-03-17 DIAGNOSIS — S79929A Unspecified injury of unspecified thigh, initial encounter: Secondary | ICD-10-CM | POA: Insufficient documentation

## 2012-03-17 DIAGNOSIS — IMO0002 Reserved for concepts with insufficient information to code with codable children: Secondary | ICD-10-CM | POA: Insufficient documentation

## 2012-03-17 DIAGNOSIS — W19XXXA Unspecified fall, initial encounter: Secondary | ICD-10-CM

## 2012-03-17 DIAGNOSIS — Y9301 Activity, walking, marching and hiking: Secondary | ICD-10-CM | POA: Insufficient documentation

## 2012-03-17 HISTORY — DX: Age-related osteoporosis without current pathological fracture: M81.0

## 2012-03-17 HISTORY — DX: Epilepsy, unspecified, not intractable, without status epilepticus: G40.909

## 2012-03-17 HISTORY — DX: Essential (primary) hypertension: I10

## 2012-03-17 HISTORY — DX: Unspecified osteoarthritis, unspecified site: M19.90

## 2012-03-17 HISTORY — DX: Disorder of thyroid, unspecified: E07.9

## 2012-03-17 MED ORDER — HYDROMORPHONE HCL PF 1 MG/ML IJ SOLN
1.0000 mg | Freq: Once | INTRAMUSCULAR | Status: AC
Start: 2012-03-17 — End: 2012-03-17
  Administered 2012-03-17: 1 mg via INTRAVENOUS
  Filled 2012-03-17 (×2): qty 1

## 2012-03-17 MED ORDER — DIPHENHYDRAMINE HCL 50 MG/ML IJ SOLN
25.0000 mg | Freq: Once | INTRAMUSCULAR | Status: AC
  Administered 2012-03-17: 50 mg via INTRAVENOUS
  Filled 2012-03-17: qty 1

## 2012-03-17 MED ORDER — LORAZEPAM 2 MG/ML IJ SOLN
1.0000 mg | Freq: Once | INTRAMUSCULAR | Status: AC
  Administered 2012-03-17: 2 mg via INTRAVENOUS
  Filled 2012-03-17: qty 1

## 2012-03-17 MED ORDER — KETOROLAC TROMETHAMINE 30 MG/ML IJ SOLN
30.0000 mg | Freq: Once | INTRAMUSCULAR | Status: AC
  Administered 2012-03-17: 30 mg via INTRAVENOUS
  Filled 2012-03-17: qty 1

## 2012-03-17 NOTE — ED Notes (Addendum)
Pt was going to be ambulated, however Dr. Roxy Cedar reassessed the pt and decided to wait until after the pt was given pain medication.

## 2012-03-17 NOTE — ED Notes (Signed)
Patient transported to CT 

## 2012-03-17 NOTE — ED Notes (Signed)
EMS called to Pt. House 2 hrs. After she had fallen on ice on driveway. MAE. In severe pain from lower back down. PMS intact no LOC.

## 2012-03-17 NOTE — ED Provider Notes (Signed)
History     CSN: [id number]  Arrival date & time 03/17/12  2128   First MD Initiated Contact with Patient 03/17/12 2130      Chief Complaint  Patient presents with  . Fall    (Consider location/radiation/quality/duration/timing/severity/associated sxs/prior treatment) HPI Comments: 37 y/o F presents s/p fall. Slipped on ice at home. Landed on left side and bottom. No head injury or LOC. Ambulatory afterwards with pain. Complains of some intermittent tingling to toes.   Patient is a 37 y.o. female presenting with fall. The history is provided by the patient and the EMS personnel.  Fall The accident occurred less than 1 hour ago. The fall occurred while walking. Distance fallen: standing. Impact surface: hard ground. There was no blood loss. Point of impact: bottom and left hip. Pain location: bottom and left hip. The pain is severe. She was ambulatory at the scene. There was no entrapment after the fall. Pertinent negatives include no fever, no abdominal pain, no bowel incontinence, no nausea, no vomiting, no hematuria, no headaches and no loss of consciousness. Treatment on scene includes a c-collar and a backboard. She has tried nothing for the symptoms.    Past Medical History  Diagnosis Date  . Seizures   . Osteoporosis   . Osteoarthritis   . Epilepsy   . Hypertension   . Thyroid disease   . Hepatitis B     History reviewed. No pertinent past surgical history.  No family history on file.  History  Substance Use Topics  . Smoking status: Never Smoker   . Smokeless tobacco: Not on file  . Alcohol Use: Yes    OB History    Grav Para Term Preterm Abortions TAB SAB Ect Mult Living                  Review of Systems  Constitutional: Negative for fever and chills.  HENT: Negative for congestion and rhinorrhea.   Eyes: Negative for pain and visual disturbance.  Respiratory: Negative for cough and shortness of breath.   Cardiovascular: Negative for chest pain.   Gastrointestinal: Negative for nausea, vomiting, abdominal pain, diarrhea and bowel incontinence.  Genitourinary: Negative for dysuria, hematuria, flank pain and difficulty urinating.  Musculoskeletal: Positive for back pain (pain over central bottom. not actually back pain).  Skin: Negative for color change and rash.  Neurological: Negative for dizziness, loss of consciousness and headaches.  All other systems reviewed and are negative.    Allergies  Penicillins and Morphine and related  Home Medications   Current Outpatient Rx  Name  Route  Sig  Dispense  Refill  . DIVALPROEX SODIUM ER 500 MG PO TB24   Oral   Take 1,000 mg by mouth daily.         Marland Kitchen GABAPENTIN 300 MG PO CAPS   Oral   Take 600 mg by mouth at bedtime.         Marland Kitchen HYDROCODONE-ACETAMINOPHEN 5-325 MG PO TABS   Oral   Take 2 tablets by mouth every 4 (four) hours as needed for pain.   5 tablet   0     BP 102/69  Pulse 83  Temp 97.7 F (36.5 C) (Oral)  Resp 18  SpO2 98%  Physical Exam  Nursing note and vitals reviewed. Constitutional: She is oriented to person, place, and time. She appears well-developed and well-nourished. No distress.  HENT:  Head: Normocephalic and atraumatic.  Eyes: Conjunctivae normal are normal. Pupils are equal, round, and reactive  to light. Right eye exhibits no discharge. Left eye exhibits no discharge.  Neck: Normal range of motion. Neck supple. No tracheal deviation present.  Cardiovascular: Normal rate, regular rhythm, normal heart sounds and intact distal pulses.   Pulmonary/Chest: Effort normal and breath sounds normal. No stridor. No respiratory distress. She has no wheezes. She has no rales.  Abdominal: Soft. She exhibits no distension. There is no tenderness. There is no guarding.  Genitourinary: Rectum normal. Rectal exam shows anal tone normal.  Musculoskeletal: She exhibits tenderness. She exhibits no edema.       Cervical back: Normal. She exhibits normal range of  motion, no tenderness, no bony tenderness and no deformity.       Thoracic back: Normal. She exhibits no tenderness, no bony tenderness and no deformity.       Lumbar back: Normal. She exhibits no tenderness, no bony tenderness and no deformity.       TTP over left gluteus and left hip. NV intact all extremities.  Neurological: She is alert and oriented to person, place, and time.  Skin: Skin is warm and dry.  Psychiatric: She has a normal mood and affect. Her behavior is normal.    ED Course  Procedures (including critical care time)  Labs Reviewed - No data to display Dg Lumbar Spine 2-3 Views  03/17/2012  *RADIOLOGY REPORT*  Clinical Data: Fall, tail bone pain extending to the left hip.  LUMBAR SPINE - 2-3 VIEW  Comparison: Contemporaneous hip radiographs, 06/12/2008 CT of the abdomen and pelvis.  Findings: Mild curvature on the AP view is favored to be secondary to rotation as alignment was normal on the prior CT.  No displaced fracture or dislocation.  Maintained vertebral body height, without subluxation.  The lower sacrum is not included on the examination.  IMPRESSION: No acute osseous abnormality of the lumbar spine.   Original Report Authenticated By: Jearld Lesch, M.D.    Dg Hip Bilateral W/pelvis  03/17/2012  *RADIOLOGY REPORT*  Clinical Data: Fall  BILATERAL HIP WITH PELVIS - 4+ VIEW  Comparison: 06/12/2008 CT  Findings:  Right hip:  No displaced fracture.  No dislocation.  No aggressive osseous lesions.  Left hip:  No displaced fracture, dislocation, or aggressive osseous lesions.  Pelvis:  Tubal ligation clips are noted.  Overlying soft tissues otherwise unremarkable.  Sacroiliac joints intact.  IMPRESSION: No acute osseous abnormality of the hips or pelvis.   Original Report Authenticated By: Jearld Lesch, M.D.    Ct Pelvis Wo Contrast  03/18/2012  *RADIOLOGY REPORT*  Clinical Data: Fall, right pelvic pain.  CT PELVIS WITHOUT CONTRAST  Technique:  Multidetector CT imaging  of the pelvis was performed following the standard protocol without intravenous contrast.  Comparison: Same day radiographs, 06/12/2008 CT  Findings: The bilateral femoral acetabular joints are located.  SI joints are intact.  No acute fracture.  No aggressive osseous lesions.  Lumbosacral junction and sacrum are intact.  Tubal ligation clips.  Small amount of free fluid within the cul-de- sac is likely physiologic.  No acute intraperitoneal process.  IMPRESSION: No acute osseous abnormality of the pelvis.   Original Report Authenticated By: Jearld Lesch, M.D.      1. Fall   2. Pain in pelvis   3. Hip pain       MDM   37 y/o F presents s/p fall. Complains of primarily left hip pain and buttocks pain. No head injury. No loc.  Plain films negative.  Persistent pain  to left hip and buttocks. Not wanting to ambulate CT pelvis to further evaluate. Negative for fracture   Likely musculoskeletal pain Doubt cauda equina, conus medullaris syndrome or other epidural compression syndrome - no saddle paresthesias, no bowel/bladder incontinence, no UMN signs on physical exam, inconsistent with history and physical, low risk, primary diagnosis much more likely Doubt abscess, epidural mass lesion, fracture, pyelonephritis, renal colic/infarct, AAA, aortic dissection, pna, pancreatitis, transverse myelitis, herpetic neuralgia, as inconsistent with history and physical, low risk, primary diagnosis much more likely   Do not believe MRI or other further imaging necessary at this time.   Dc home. Return precautions given. Follow up with primary care physician. Patient in agreement with plan.  ambulatory  Labs and imaging reviewed by myself and considered in medical decision making if ordered. Imaging interpreted by radiology.   Discussed case with Dr. Manus Gunning who is in agreement with assessment and plan.  New Prescriptions   HYDROCODONE-ACETAMINOPHEN (NORCO/VICODIN) 5-325 MG PER TABLET    Take 2  tablets by mouth every 4 (four) hours as needed for pain.          Stevie Kern, MD 03/18/12 4540  Stevie Kern, MD 03/18/12 [phone]

## 2012-03-17 NOTE — ED Notes (Signed)
Back board, head blocks removed per Dr. Roxy Cedar. C. Collar remains intact. Pt. Tender in her lower back.

## 2012-03-17 NOTE — ED Notes (Signed)
Patient transported to X-ray 

## 2012-03-18 MED ORDER — HYDROCODONE-ACETAMINOPHEN 5-325 MG PO TABS: 2.0000 | ORAL_TABLET | ORAL | Status: DC | PRN

## 2012-03-18 NOTE — ED Provider Notes (Signed)
I saw and evaluated the patient, reviewed the resident's note and I agree with the findings and plan.  L hip and pelvic pain after fall from standing. Did not hit head or LOC. No head or C spine pain. TTP midline lumbar spine, L iliac crest. NO abdominal pain. +2 DP and PT pulses. 5/5 strength bilateral lower extremities.  Glynn Octave, MD 03/18/12 [phone]

## 2012-03-18 NOTE — ED Notes (Signed)
C-Collar removed per MD.

## 2013-08-27 ENCOUNTER — Encounter (HOSPITAL_COMMUNITY): Payer: Self-pay | Admitting: Emergency Medicine

## 2013-08-27 ENCOUNTER — Emergency Department (HOSPITAL_COMMUNITY): Admission: EM | Admit: 2013-08-27 | Discharge: 2013-08-27 | Discharge status: Absent without leave | Payer: Self-pay

## 2013-08-27 ENCOUNTER — Emergency Department (HOSPITAL_COMMUNITY)
Admission: EM | Admit: 2013-08-27 | Discharge: 2013-08-27 | Disposition: A | Discharge status: Home / Self Care | Payer: Self-pay | Attending: Emergency Medicine | Admitting: Emergency Medicine

## 2013-08-27 ENCOUNTER — Emergency Department (HOSPITAL_COMMUNITY): Payer: Self-pay

## 2013-08-27 DIAGNOSIS — Y9289 Other specified places as the place of occurrence of the external cause: Secondary | ICD-10-CM | POA: Insufficient documentation

## 2013-08-27 DIAGNOSIS — Z7982 Long term (current) use of aspirin: Secondary | ICD-10-CM | POA: Insufficient documentation

## 2013-08-27 DIAGNOSIS — Z862 Personal history of diseases of the blood and blood-forming organs and certain disorders involving the immune mechanism: Secondary | ICD-10-CM | POA: Insufficient documentation

## 2013-08-27 DIAGNOSIS — M810 Age-related osteoporosis without current pathological fracture: Secondary | ICD-10-CM | POA: Insufficient documentation

## 2013-08-27 DIAGNOSIS — M1990 Unspecified osteoarthritis, unspecified site: Secondary | ICD-10-CM | POA: Insufficient documentation

## 2013-08-27 DIAGNOSIS — Y9301 Activity, walking, marching and hiking: Secondary | ICD-10-CM | POA: Insufficient documentation

## 2013-08-27 DIAGNOSIS — Z8669 Personal history of other diseases of the nervous system and sense organs: Secondary | ICD-10-CM | POA: Insufficient documentation

## 2013-08-27 DIAGNOSIS — Z8619 Personal history of other infectious and parasitic diseases: Secondary | ICD-10-CM | POA: Insufficient documentation

## 2013-08-27 DIAGNOSIS — I10 Essential (primary) hypertension: Secondary | ICD-10-CM | POA: Insufficient documentation

## 2013-08-27 DIAGNOSIS — Z8639 Personal history of other endocrine, nutritional and metabolic disease: Secondary | ICD-10-CM | POA: Insufficient documentation

## 2013-08-27 DIAGNOSIS — R110 Nausea: Secondary | ICD-10-CM | POA: Insufficient documentation

## 2013-08-27 DIAGNOSIS — Z880 Allergy status to penicillin: Secondary | ICD-10-CM | POA: Insufficient documentation

## 2013-08-27 DIAGNOSIS — Z9889 Other specified postprocedural states: Secondary | ICD-10-CM | POA: Insufficient documentation

## 2013-08-27 DIAGNOSIS — S060X0A Concussion without loss of consciousness, initial encounter: Secondary | ICD-10-CM | POA: Insufficient documentation

## 2013-08-27 DIAGNOSIS — W1809XA Striking against other object with subsequent fall, initial encounter: Secondary | ICD-10-CM | POA: Insufficient documentation

## 2013-08-27 MED ORDER — KETOROLAC TROMETHAMINE 30 MG/ML IJ SOLN
30.0000 mg | Freq: Once | INTRAMUSCULAR | Status: AC
  Administered 2013-08-27: 30 mg via INTRAMUSCULAR
  Filled 2013-08-27: qty 1

## 2013-08-27 NOTE — ED Notes (Signed)
Patient transported to CT 

## 2013-08-27 NOTE — ED Notes (Signed)
Patient returned from CT

## 2013-08-27 NOTE — ED Provider Notes (Signed)
CSN: [id number]     Arrival date & time 08/27/13  1847 History   First MD Initiated Contact with Patient 08/27/13 2008     Chief Complaint  Patient presents with  . Fall  . Head Injury   HPI  History provided by the patient. Patient is a 38 year old female with past history of cranial surgery, epilepsy, hypothyroidism who presents with symptoms of headache, fatigue and nausea after a fall and head injury. 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 She has a metal plate over her left skull. She reports coming down her front porch steps early this morning before work when her cat ran out and went into her legs causing her to fall. She believes she hit the left side of her head against the railing. She did not have any LOC. She began having some headaches and nausea symptoms which have been persistent throughout the day. She also has some general fatigue. She does report usually having a daily mild headache to the left side the headache now feels much different. She took 2 Aleve without any relief. No other aggravating or alleviating factors. No other associated symptoms.   Past Medical History  Diagnosis Date  . Seizures   . Osteoporosis   . Osteoarthritis   . Epilepsy   . Hypertension   . Thyroid disease   . Hepatitis B    Past Surgical History  Procedure Laterality Date  . Head hardware removal  Pt has a plate in her head  . Tubal ligation     No family history on file. History  Substance Use Topics  . Smoking status: Never Smoker   . Smokeless tobacco: Not on file  . Alcohol Use: Yes   OB History   Grav Para Term Preterm Abortions TAB SAB Ect Mult Living                 Review of Systems  Constitutional: Positive for fatigue. Negative for fever.  Eyes: Negative for photophobia and visual disturbance.  Gastrointestinal: Positive for nausea.  Neurological: Positive for light-headedness and headaches. Negative for weakness  and numbness.  All other systems reviewed and are negative.     Allergies  Penicillins and Morphine and related  Home Medications   Prior to Admission medications   Medication Sig Start Date End Date Taking? Authorizing Provider  ASPIRIN PO Take 2 tablets by mouth daily.   Yes Historical Provider, MD  ibuprofen (ADVIL,MOTRIN) 200 MG tablet Take 800 mg by mouth every 6 (six) hours as needed.   Yes Historical Provider, MD   BP 121/96  Pulse 93  Temp(Src) 98.2 F (36.8 C) (Oral)  Resp 26  Ht 5\' 4"  (1.626 m)  Wt 156 lb (70.761 kg)  BMI 26.76 kg/m2  SpO2 100%  LMP 07/28/2013 Physical Exam  Nursing note and vitals reviewed. Constitutional: She is oriented to person, place, and time. She appears well-developed and well-nourished. No distress.  HENT:  Head: Normocephalic and atraumatic.  Mouth/Throat: Oropharynx is clear and moist.  Some deformity of the left parietal skull consistent with metal plate from surgery. This is firm and secure in place. There is some tenderness over the area without any significant hematoma. No other depressions. No Battle sign or raccoon eyes.  Eyes: Conjunctivae and EOM are normal. Pupils are equal, round, and reactive to light.  Neck: Normal range of motion. Neck supple.  No meningeal signs  Cardiovascular: Normal rate and  regular rhythm.   No murmur heard. Pulmonary/Chest: Effort normal and breath sounds normal. No respiratory distress. She has no wheezes. She has no rales.  Abdominal: Soft. There is no tenderness. There is no rigidity, no rebound, no guarding, no CVA tenderness and no tenderness at McBurney's point.  Musculoskeletal: Normal range of motion. She exhibits no edema and no tenderness.  Neurological: She is alert and oriented to person, place, and time. She has normal strength. No cranial nerve deficit or sensory deficit. Gait normal.  Skin: Skin is warm and dry. No rash noted.  Psychiatric: She has a normal mood and affect. Her  behavior is normal.    ED Course  Procedures   COORDINATION OF CARE:  Nursing notes reviewed. Vital signs reviewed. Initial pt interview and examination performed.   Filed Vitals:   08/27/13 1852 08/27/13 1934 08/27/13 1937  BP: 121/86 121/96 121/96  Pulse: 94 79 93  Temp: 98.2 F (36.8 C)    TempSrc: Oral    Resp: 18 18 26   Height: 5\' 4"  (1.626 m)    Weight: 156 lb (70.761 kg)    SpO2: 96% 100% 100%    8:23 PM-patient seen and evaluated. The patient well appearing in no acute distress. Normal nonfocal neuro exam. Discussed options of a CT scan of the patient given her history of previous surgery an atypical headache. Suspect this is probably concussion type symptoms however patient is concerned and would like to proceed with a CT scan of the head.  Patient feeling much better after Toradol. CT scan without any concerning injury. At this time suspect mild concussion. Discussed the diagnosis and treatment plan the patient and she agrees. Strict return precautions given.  Treatment plan initiated: Medications  ketorolac (TORADOL) 30 MG/ML injection 30 mg (not administered)      Imaging Review Ct Head Wo Contrast  08/27/2013   CLINICAL DATA:  Status post fall with a blow to the left side of the head.  EXAM: CT HEAD WITHOUT CONTRAST  TECHNIQUE: Contiguous axial images were obtained from the base of the skull through the vertex without intravenous contrast.  COMPARISON:  Head CT scan 10/29/2008.  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 No focal bony abnormality is identified. The brain appears normal without infarct, hemorrhage, mass lesion, mass effect, midline shift or abnormal extra-axial fluid collection. There is no hydrocephalus or pneumocephalus.  IMPRESSION: No acute abnormality.   Electronically Signed   By: Inge Rise M.D.   On: 08/27/2013 20:56     MDM   Final diagnoses:  Concussion,  without loss of consciousness, initial encounter       Martie Lee, PA-C 08/27/13 2124

## 2013-08-27 NOTE — Discharge Instructions (Signed)
Your CAT scan did not show any concerning or emergent injury from your fall. At this time your providers feel that you have a mild concussion symptoms. Please followup with primary care provider for continued evaluation and treatment. Return any time for changing or worsening symptoms.    Concussion A concussion is a brain injury. It is caused by:  A hit to the head.  A quick and sudden movement (jolt) of the head or neck. A concussion is usually not life-threatening. Even so, it can cause serious problems. If you had a concussion before, you may have concussion-like problems after a hit to your head. HOME CARE General Instructions  Follow your doctor's directions carefully.  Take medicines only as told by your doctor.  Only take medicines your doctor says are safe.  Do not drink alcohol until your doctor says it is okay. Alcohol and some drugs can slow down healing. They can also put you at risk for further injury.  If you are having trouble remembering things, write them down.  Try to do one thing at a time if you get distracted easily. For example, do not watch TV while making dinner.  Talk to your family members or close friends when making important decisions.  Follow up with your doctor as told.  Watch your symptoms. Tell others to do the same. Serious problems can sometimes happen after a concussion. Older adults are more likely to have these problems.  Tell your teachers, school nurse, school counselor, coach, Product/process development scientist, or work Freight forwarder about your concussion. Tell them about what you can or cannot do. They should watch to see if:  It gets even harder for you to pay attention or concentrate.  It gets even harder for you to remember things or learn new things.  You need more time than normal to finish things.  You become annoyed (irritable) more than before.  You are not able to deal with stress as well.  You have more problems than before.  Rest. Make sure  you:  Get plenty of sleep at night.  Go to sleep early.  Go to bed at the same time every day. Try to wake up at the same time.  Rest during the day.  Take naps when you feel tired.  Limit activities where you have to think a lot or concentrate. These include:  Doing homework.  Doing work related to a job.  Watching TV.  Using the computer. Returning To Your Regular Activities Return to your normal activities slowly, not all at once. You must give your body and brain enough time to heal.   Do not play sports or do other athletic activities until your doctor says it is okay.  Ask your doctor when you can drive, ride a bicycle, or work other vehicles or machines. Never do these things if you feel dizzy.  Ask your doctor about when you can return to work or school. Preventing Another Concussion It is very important to avoid another brain injury, especially before you have healed. In rare cases, another injury can lead to permanent brain damage, brain swelling, or death. The risk of this is greatest during the first 7-10 days after your injury. Avoid injuries by:   Wearing a seat belt when riding in a car.  Not drinking too much alcohol.  Avoiding activities that could lead to a second concussion (such as contact sports).  Wearing a helmet when doing activities like:  Biking.  Skiing.  Skateboarding.  Skating.  Making your home safer by:  Removing things from the floor or stairways that could make you trip.  Using grab bars in bathrooms and handrails by stairs.  Placing non-slip mats on floors and in bathtubs.  Improve lighting in dark areas. GET HELP IF:  It gets even harder for you to pay attention or concentrate.  It gets even harder for you to remember things or learn new things.  You need more time than normal to finish things.  You become annoyed (irritable) more than before.  You are not able to deal with stress as well.  You have more problems  than before.  You have problems keeping your balance.  You are not able to react quickly when you should. Get help if you have any of these problems for more than 2 weeks:   Lasting (chronic) headaches.  Dizziness or trouble balancing.  Feeling sick to your stomach (nausea).  Seeing (vision) problems.  Being affected by noises or light more than normal.  Feeling sad, low, down in the dumps, blue, gloomy, or empty (depressed).  Mood changes (mood swings).  Feeling of fear or nervousness about what may happen (anxiety).  Feeling annoyed.  Memory problems.  Problems concentrating or paying attention.  Sleep problems.  Feeling tired all the time. GET HELP RIGHT AWAY IF:   You have bad headaches or your headaches get worse.  You have weakness (even if it is in one hand, leg, or part of the face).  You have loss of feeling (numbness).  You feel off balance.  You keep throwing up (vomiting).  You feel tired.  One black center of your eye (pupil) is larger than the other.  You twitch or shake violently (convulse).  Your speech is not clear (slurred).  You are more confused, easily angered (agitated), or annoyed than before.  You have more trouble resting than before.  You are unable to recognize people or places.  You have neck pain.  It is difficult to wake you up.  You have unusual behavior changes.  You pass out (lose consciousness). MAKE SURE YOU:   Understand these instructions.  Will watch your condition.  Will get help right away if you are not doing well or get worse. Document Released: 01/21/2009 Document Revised: 02/07/2013 Document Reviewed: 08/25/2012 [facility] Patient Information 2015 [place], [place]. This information is not intended to replace advice given to you by your health care provider. Make sure you discuss any questions you have with your health care provider.

## 2013-08-27 NOTE — ED Notes (Addendum)
Pt reports while walking down her steps outside this morning a cat came across her path causing her to fall. Pt reports she hit her head on the wooden railing. Pt c/o headache with nausea. 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

## 2013-08-29 NOTE — ED Provider Notes (Signed)
Medical screening examination/treatment/procedure(s) were performed by non-physician practitioner and as supervising physician I was immediately available for consultation/collaboration.   EKG Interpretation None        Wandra Arthurs, MD 08/29/13 2223

## 2014-02-11 ENCOUNTER — Encounter (HOSPITAL_COMMUNITY): Payer: Self-pay | Admitting: Nurse Practitioner

## 2014-02-11 ENCOUNTER — Emergency Department (HOSPITAL_COMMUNITY)
Admission: EM | Admit: 2014-02-11 | Discharge: 2014-02-11 | Disposition: A | Discharge status: Home / Self Care | Payer: Self-pay | Attending: Emergency Medicine | Admitting: Emergency Medicine

## 2014-02-11 ENCOUNTER — Emergency Department (HOSPITAL_COMMUNITY): Payer: Self-pay

## 2014-02-11 DIAGNOSIS — Z3202 Encounter for pregnancy test, result negative: Secondary | ICD-10-CM | POA: Insufficient documentation

## 2014-02-11 DIAGNOSIS — Z8669 Personal history of other diseases of the nervous system and sense organs: Secondary | ICD-10-CM | POA: Insufficient documentation

## 2014-02-11 DIAGNOSIS — R109 Unspecified abdominal pain: Secondary | ICD-10-CM

## 2014-02-11 DIAGNOSIS — Z8619 Personal history of other infectious and parasitic diseases: Secondary | ICD-10-CM | POA: Insufficient documentation

## 2014-02-11 DIAGNOSIS — I10 Essential (primary) hypertension: Secondary | ICD-10-CM | POA: Insufficient documentation

## 2014-02-11 DIAGNOSIS — R1013 Epigastric pain: Secondary | ICD-10-CM | POA: Insufficient documentation

## 2014-02-11 DIAGNOSIS — Z7982 Long term (current) use of aspirin: Secondary | ICD-10-CM | POA: Insufficient documentation

## 2014-02-11 DIAGNOSIS — Z79899 Other long term (current) drug therapy: Secondary | ICD-10-CM | POA: Insufficient documentation

## 2014-02-11 DIAGNOSIS — Z8639 Personal history of other endocrine, nutritional and metabolic disease: Secondary | ICD-10-CM | POA: Insufficient documentation

## 2014-02-11 DIAGNOSIS — M1990 Unspecified osteoarthritis, unspecified site: Secondary | ICD-10-CM | POA: Insufficient documentation

## 2014-02-11 DIAGNOSIS — R1011 Right upper quadrant pain: Secondary | ICD-10-CM | POA: Insufficient documentation

## 2014-02-11 DIAGNOSIS — R112 Nausea with vomiting, unspecified: Secondary | ICD-10-CM | POA: Insufficient documentation

## 2014-02-11 DIAGNOSIS — Z880 Allergy status to penicillin: Secondary | ICD-10-CM | POA: Insufficient documentation

## 2014-02-11 LAB — COMPREHENSIVE METABOLIC PANEL
ALBUMIN: 4.6 g/dL (ref 3.5–5.2)
ALT: 25 U/L (ref 0–35)
AST: 25 U/L (ref 0–37)
Alkaline Phosphatase: 56 U/L (ref 39–117)
Anion gap: 5 (ref 5–15)
BILIRUBIN TOTAL: 1.1 mg/dL (ref 0.3–1.2)
BUN: 9 mg/dL (ref 6–23)
CHLORIDE: 107 meq/L (ref 96–112)
CO2: 27 mmol/L (ref 19–32)
CREATININE: 0.8 mg/dL (ref 0.50–1.10)
Calcium: 9.6 mg/dL (ref 8.4–10.5)
GFR calc Af Amer: >90 mL/min (ref >90)
GFR calc non Af Amer: >90 mL/min (ref >90)
Glucose, Bld: 85 mg/dL (ref 70–99)
POTASSIUM: 3.9 mmol/L (ref 3.5–5.1)
SODIUM: 139 mmol/L (ref 135–145)
Total Protein: 7.9 g/dL (ref 6.0–8.3)

## 2014-02-11 LAB — CBC WITH DIFFERENTIAL/PLATELET
BASOS ABS: 0 10*3/uL (ref 0.0–0.1)
BASOS PCT: 0 % (ref 0–1)
Eosinophils Absolute: 0.1 10*3/uL (ref 0.0–0.7)
Eosinophils Relative: 1 % (ref 0–5)
HCT: 46.2 % — ABNORMAL HIGH (ref 36.0–46.0)
Hemoglobin: 15.8 g/dL — ABNORMAL HIGH (ref 12.0–15.0)
LYMPHS PCT: 35 % (ref 12–46)
Lymphs Abs: 3.6 10*3/uL (ref 0.7–4.0)
MCH: 30.7 pg (ref 26.0–34.0)
MCHC: 34.2 g/dL (ref 30.0–36.0)
MCV: 89.9 fL (ref 78.0–100.0)
MONO ABS: 0.5 10*3/uL (ref 0.1–1.0)
Monocytes Relative: 5 % (ref 3–12)
NEUTROS ABS: 6.2 10*3/uL (ref 1.7–7.7)
NEUTROS PCT: 59 % (ref 43–77)
PLATELETS: 217 10*3/uL (ref 150–400)
RBC: 5.14 MIL/uL — ABNORMAL HIGH (ref 3.87–5.11)
RDW: 12.4 % (ref 11.5–15.5)
WBC: 10.4 10*3/uL (ref 4.0–10.5)

## 2014-02-11 LAB — URINALYSIS, ROUTINE W REFLEX MICROSCOPIC
BILIRUBIN URINE: NEGATIVE
GLUCOSE, UA: NEGATIVE mg/dL
Hgb urine dipstick: NEGATIVE
Ketones, ur: NEGATIVE mg/dL
LEUKOCYTES UA: NEGATIVE
NITRITE: NEGATIVE
PH: 7 (ref 5.0–8.0)
Protein, ur: NEGATIVE mg/dL
SPECIFIC GRAVITY, URINE: 1.02 (ref 1.005–1.030)
Urobilinogen, UA: 0.2 mg/dL (ref 0.0–1.0)

## 2014-02-11 LAB — LIPASE, BLOOD: LIPASE: 26 U/L (ref 11–59)

## 2014-02-11 LAB — PREGNANCY, URINE: Preg Test, Ur: NEGATIVE

## 2014-02-11 MED ORDER — ONDANSETRON HCL 4 MG/2ML IJ SOLN
4.0000 mg | Freq: Once | INTRAMUSCULAR | Status: AC
  Administered 2014-02-11: 4 mg via INTRAVENOUS
  Filled 2014-02-11: qty 2

## 2014-02-11 MED ORDER — GI COCKTAIL ~~LOC~~
30.0000 mL | Freq: Once | ORAL | Status: AC
  Administered 2014-02-11: 30 mL via ORAL
  Filled 2014-02-11: qty 30

## 2014-02-11 MED ORDER — SODIUM CHLORIDE 0.9 % IV BOLUS (SEPSIS)
1000.0000 mL | Freq: Once | INTRAVENOUS | Status: AC
  Administered 2014-02-11: 1000 mL via INTRAVENOUS

## 2014-02-11 MED ORDER — OMEPRAZOLE 20 MG PO CPDR: 20.0000 mg | DELAYED_RELEASE_CAPSULE | Freq: Every day | ORAL | Status: DC

## 2014-02-11 MED ORDER — PANTOPRAZOLE SODIUM 40 MG IV SOLR
40.0000 mg | Freq: Once | INTRAVENOUS | Status: AC
  Administered 2014-02-11: 40 mg via INTRAVENOUS
  Filled 2014-02-11: qty 40

## 2014-02-11 MED ORDER — HYDROMORPHONE HCL 1 MG/ML IJ SOLN
1.0000 mg | Freq: Once | INTRAMUSCULAR | Status: AC
  Administered 2014-02-11: 1 mg via INTRAVENOUS
  Filled 2014-02-11: qty 1

## 2014-02-11 MED ORDER — HYDROCODONE-ACETAMINOPHEN 5-325 MG PO TABS: 2.0000 | ORAL_TABLET | ORAL | Status: DC | PRN

## 2014-02-11 MED ORDER — IBUPROFEN 800 MG PO TABS: 800.0000 mg | ORAL_TABLET | Freq: Three times a day (TID) | ORAL | Status: DC

## 2014-02-11 NOTE — ED Notes (Signed)
Pt returned from US

## 2014-02-11 NOTE — ED Notes (Signed)
shes had upper abd pain and a metal taste in her mouth since doing a pull up on a pull up bar 2 days ago. Shes had n/v/indigestion. Also constipation x 2 days.

## 2014-02-11 NOTE — ED Notes (Signed)
Patient given water for PO challenge.  

## 2014-02-11 NOTE — ED Provider Notes (Signed)
CSN: [id number]     Arrival date & time 02/11/14  1228 History   First MD Initiated Contact with Patient 02/11/14 1501     Chief Complaint  Patient presents with  . Abdominal Pain     (Consider location/radiation/quality/duration/timing/severity/associated sxs/prior Treatment) HPI Comments: She complains of a three-day history of constant upper abdominal pain that onset on 12/25 after doing a pull-up. She had no pain before this. Pain is constant but waxes and wanes in severity. It is worse with palpation and movement. It is better with rest. She endorses 2 episodes of nausea and vomiting today and constant metallic taste in her mouth that onset after vomiting. Says she has not had a bowel movement since 12/25. She is still passing gas. No fever, chills, urinary or vaginal symptoms. No chest pain or shortness of breath. Gallbladder still intact.  The history is provided by the patient.    Past Medical History  Diagnosis Date  . Seizures   . Osteoporosis   . Osteoarthritis   . Epilepsy   . Hypertension   . Thyroid disease   . Hepatitis B    Past Surgical History  Procedure Laterality Date  . Head hardware removal  Pt has a plate in her head  . Tubal ligation     History reviewed. No pertinent family history. History  Substance Use Topics  . Smoking status: Never Smoker   . Smokeless tobacco: Not on file  . Alcohol Use: Yes   OB History    No data available     Review of Systems  Constitutional: Positive for activity change and appetite change. Negative for fever.  HENT: Negative for congestion and rhinorrhea.   Eyes: Negative for visual disturbance.  Respiratory: Negative for cough, chest tightness and shortness of breath.   Gastrointestinal: Positive for nausea, vomiting and abdominal pain.  Genitourinary: Negative for dysuria, urgency, vaginal bleeding and vaginal discharge.  Musculoskeletal: Negative for myalgias, back pain and arthralgias.  Skin: Negative for  wound.  Neurological: Negative for dizziness, weakness and headaches.  A complete 10 system review of systems was obtained and all systems are negative except as noted in the HPI and PMH.      Allergies  Penicillins and Morphine and related  Home Medications   Prior to Admission medications   Medication Sig Start Date End Date Taking? Authorizing Provider  ASPIRIN PO Take 2 tablets by mouth daily.   Yes Historical Provider, MD  calcium carbonate (TUMS - DOSED IN MG ELEMENTAL CALCIUM) 500 MG chewable tablet Chew 1 tablet by mouth daily.   Yes Historical Provider, MD  HYDROcodone-acetaminophen (NORCO/VICODIN) 5-325 MG per tablet Take 2 tablets by mouth every 4 (four) hours as needed. 02/11/14   Ezequiel Essex, MD  ibuprofen (ADVIL,MOTRIN) 800 MG tablet Take 1 tablet (800 mg total) by mouth 3 (three) times daily. 02/11/14   Ezequiel Essex, MD  omeprazole (PRILOSEC) 20 MG capsule Take 1 capsule (20 mg total) by mouth daily. 02/11/14   Ezequiel Essex, MD   BP 108/69 mmHg  Pulse 59  Temp(Src) 97.9 F (36.6 C) (Oral)  Resp 14  Ht 5\' 3"  (1.6 m)  Wt 161 lb (73.029 kg)  BMI 28.53 kg/m2  SpO2 98% Physical Exam  Constitutional: She is oriented to person, place, and time. She appears well-developed and well-nourished. No distress.  HENT:  Head: Normocephalic and atraumatic.  Mouth/Throat: Oropharynx is clear and moist. No oropharyngeal exudate.  Eyes: Conjunctivae and EOM are normal. Pupils are equal, round,  and reactive to light.  Neck: Normal range of motion. Neck supple.  No meningismus.  Cardiovascular: Normal rate, regular rhythm, normal heart sounds and intact distal pulses.   No murmur heard. Pulmonary/Chest: Effort normal and breath sounds normal. No respiratory distress.  Abdominal: Soft. There is tenderness. There is no rebound and no guarding.  TTP epigastrium and RUQ pain with guarding.   Musculoskeletal: Normal range of motion. She exhibits no edema or tenderness.  No  CVAT  Neurological: She is alert and oriented to person, place, and time. No cranial nerve deficit. She exhibits normal muscle tone. Coordination normal.  No ataxia on finger to nose bilaterally. No pronator drift. 5/5 strength throughout. CN 2-12 intact. Negative Romberg. Equal grip strength. Sensation intact. Gait is normal.   Skin: Skin is warm.  Psychiatric: She has a normal mood and affect. Her behavior is normal.  Nursing note and vitals reviewed.   ED Course  Procedures (including critical care time) Labs Review Labs Reviewed  CBC WITH DIFFERENTIAL - Abnormal; Notable for the following:    RBC 5.14 (*)    Hemoglobin 15.8 (*)    HCT 46.2 (*)    All other components within normal limits  COMPREHENSIVE METABOLIC PANEL  LIPASE, BLOOD  URINALYSIS, ROUTINE W REFLEX MICROSCOPIC  PREGNANCY, URINE    Imaging Review Dg Abd Acute W/chest  02/11/2014   CLINICAL DATA:  Abdominal pain, supraumbilical pain  EXAM: ACUTE ABDOMEN SERIES (ABDOMEN 2 VIEW & CHEST 1 VIEW)  COMPARISON:  03/17/2012  FINDINGS: Cardiomediastinal silhouette is unremarkable. No acute infiltrate or pleural effusion. No pulmonary edema. Bony thorax is unremarkable. There is nonspecific nonobstructive bowel gas pattern. No free abdominal air. Post tubal ligation surgical clips are noted. Some stool noted in distal sigmoid colon and rectum.  IMPRESSION: Negative abdominal radiographs. No acute cardiopulmonary disease. No free abdominal air.   Electronically Signed   By: Lahoma Crocker M.D.   On: 02/11/2014 16:15   US Abdomen Limited Ruq  02/11/2014   CLINICAL DATA:  Right upper quadrant pain.  EXAM: US ABDOMEN LIMITED - RIGHT UPPER QUADRANT  COMPARISON:  Abdominal CT 06/12/2008  FINDINGS: Gallbladder:  There is a 5 mm echogenic structure along the gallbladder wall. This likely represents a small gallbladder polyp. No evidence for gallstones or wall thickening. Patient does not have a sonographic Murphy's sign.  Common bile duct:   Diameter: 5 mm.  Liver:  Liver parenchyma is grossly without focal lesion. Previous CT demonstrated hepatic cysts which are not imaged on this examination.  IMPRESSION: 5 mm gallbladder polyp.  No definite gallstones.  No evidence for biliary dilatation.   Electronically Signed   By: Markus Daft M.D.   On: 02/11/2014 15:54     EKG Interpretation None      MDM   Final diagnoses:  RUQ pain  Abdominal pain   Epigastric abdominal pain after doing pull-up 3 days ago. Associated with nausea and vomiting.  LFTs and lipase normal. Urinalysis negative. Pregnancy test negative. Ultrasound shows no gallstones.  Pain improved with treatment in the ED. She is tolerating by mouth. Suspect muscle strain secondary to pull-up. Pain did not start until after she did the pull-up.  We'll treat with anti-inflammatories, PPI, pain control, follow-up with PCP.  Ezequiel Essex, MD 02/12/14 [phone]

## 2014-02-11 NOTE — ED Notes (Signed)
Pt reports upper abdominal pain since christmas after doing a pull up. Pt reports last bm 12/25 and was diarrhea.  Pt reports nausea and vomiting with a "metalic" taste in her mouth after emesis.  Pt denies blood in stool or emesis. Pt denies fever/chills.

## 2014-02-11 NOTE — ED Notes (Signed)
Patient tolerated PO challenge

## 2014-02-11 NOTE — Discharge Instructions (Signed)
Abdominal Pain Take the stomach medications as prescribed. Follow-up with your doctor. Return to the ED if you develop new or worsening symptoms. Many things can cause abdominal pain. Usually, abdominal pain is not caused by a disease and will improve without treatment. It can often be observed and treated at home. Your health care provider will do a physical exam and possibly order blood tests and X-rays to help determine the seriousness of your pain. However, in many cases, more time must pass before a clear cause of the pain can be found. Before that point, your health care provider may not know if you need more testing or further treatment. HOME CARE INSTRUCTIONS  Monitor your abdominal pain for any changes. The following actions may help to alleviate any discomfort you are experiencing:  Only take over-the-counter or prescription medicines as directed by your health care provider.  Do not take laxatives unless directed to do so by your health care provider.  Try a clear liquid diet (broth, tea, or water) as directed by your health care provider. Slowly move to a bland diet as tolerated. SEEK MEDICAL CARE IF:  You have unexplained abdominal pain.  You have abdominal pain associated with nausea or diarrhea.  You have pain when you urinate or have a bowel movement.  You experience abdominal pain that wakes you in the night.  You have abdominal pain that is worsened or improved by eating food.  You have abdominal pain that is worsened with eating fatty foods.  You have a fever. SEEK IMMEDIATE MEDICAL CARE IF:   Your pain does not go away within 2 hours.  You keep throwing up (vomiting).  Your pain is felt only in portions of the abdomen, such as the right side or the left lower portion of the abdomen.  You pass bloody or black tarry stools. MAKE SURE YOU:  Understand these instructions.   Will watch your condition.   Will get help right away if you are not doing well or  get worse.  Document Released: 11/12/2004 Document Revised: 02/07/2013 Document Reviewed: 10/12/2012 [facility] Patient Information 2015 [place], [place]. This information is not intended to replace advice given to you by your health care provider. Make sure you discuss any questions you have with your health care provider.

## 2014-04-08 ENCOUNTER — Emergency Department (INDEPENDENT_AMBULATORY_CARE_PROVIDER_SITE_OTHER)
Admission: EM | Admit: 2014-04-08 | Discharge: 2014-04-08 | Disposition: A | Discharge status: Home / Self Care | Payer: Self-pay | Source: Home / Self Care | Attending: Family Medicine | Admitting: Family Medicine

## 2014-04-08 ENCOUNTER — Encounter (HOSPITAL_COMMUNITY): Payer: Self-pay | Admitting: *Deleted

## 2014-04-08 ENCOUNTER — Emergency Department (INDEPENDENT_AMBULATORY_CARE_PROVIDER_SITE_OTHER): Payer: Self-pay

## 2014-04-08 DIAGNOSIS — J0141 Acute recurrent pansinusitis: Secondary | ICD-10-CM

## 2014-04-08 MED ORDER — IPRATROPIUM BROMIDE 0.06 % NA SOLN: 2.0000 | Freq: Four times a day (QID) | NASAL | Status: DC

## 2014-04-08 MED ORDER — MINOCYCLINE HCL 100 MG PO CAPS: 100.0000 mg | ORAL_CAPSULE | Freq: Two times a day (BID) | ORAL | Status: DC

## 2014-04-08 NOTE — ED Notes (Signed)
Pt  Reports  Symptoms  Of  Nasal  congestion         Stuffy  Nose        sorethroat       Sinus  Drainage  And  A  Cough  With  Symptoms  X  sev  Weeks     Pt  Is  Awake  And  Alert          Body  Aches           Sitting  Upright on  The  Exam table  Speaking in  complete   sentances

## 2014-04-08 NOTE — Discharge Instructions (Signed)
Drink plenty of fluids as discussed, use medicine as prescribed, and mucinex or delsym for cough. Return or see your doctor if further problems °

## 2014-04-08 NOTE — ED Provider Notes (Signed)
CSN: [id number]     Arrival date & time 04/08/14  1337 History   First MD Initiated Contact with Patient 04/08/14 1422     Chief Complaint  Patient presents with  . URI   (Consider location/radiation/quality/duration/timing/severity/associated sxs/prior Treatment) Patient is a 39 y.o. female presenting with URI. The history is provided by the patient.  URI Presenting symptoms: congestion, cough and rhinorrhea   Presenting symptoms: no fever and no sore throat   Severity:  Moderate Onset quality:  Gradual Duration:  2 weeks Progression:  Worsening Chronicity:  New Relieved by:  Nothing Ineffective treatments:  OTC medications Associated symptoms: sneezing   Associated symptoms: no wheezing   Risk factors: sick contacts   Risk factors comment:  Son with similar sx has resolved.   Past Medical History  Diagnosis Date  . Seizures   . Osteoporosis   . Osteoarthritis   . Epilepsy   . Hypertension   . Thyroid disease   . Hepatitis B    Past Surgical History  Procedure Laterality Date  . Head hardware removal  Pt has a plate in her head  . Tubal ligation     No family history on file. History  Substance Use Topics  . Smoking status: Never Smoker   . Smokeless tobacco: Not on file  . Alcohol Use: Yes   OB History    No data available     Review of Systems  Constitutional: Negative.  Negative for fever.  HENT: Positive for congestion, postnasal drip, rhinorrhea and sneezing. Negative for sore throat.   Respiratory: Positive for cough. Negative for wheezing.   Cardiovascular: Negative.   Gastrointestinal: Negative.     Allergies  Penicillins and Morphine and related  Home Medications   Prior to Admission medications   Medication Sig Start Date End Date Taking? Authorizing Provider  ASPIRIN PO Take 2 tablets by mouth daily.    Historical Provider, MD  calcium carbonate (TUMS - DOSED IN MG ELEMENTAL CALCIUM) 500 MG chewable tablet Chew 1 tablet by mouth daily.     Historical Provider, MD  HYDROcodone-acetaminophen (NORCO/VICODIN) 5-325 MG per tablet Take 2 tablets by mouth every 4 (four) hours as needed. 02/11/14   Ezequiel Essex, MD  ibuprofen (ADVIL,MOTRIN) 800 MG tablet Take 1 tablet (800 mg total) by mouth 3 (three) times daily. 02/11/14   Ezequiel Essex, MD  ipratropium (ATROVENT) 0.06 % nasal spray Place 2 sprays into the nose 4 (four) times daily. 04/08/14   Billy Fischer, MD  minocycline (MINOCIN,DYNACIN) 100 MG capsule Take 1 capsule (100 mg total) by mouth 2 (two) times daily. 04/08/14   Billy Fischer, MD  omeprazole (PRILOSEC) 20 MG capsule Take 1 capsule (20 mg total) by mouth daily. 02/11/14   Ezequiel Essex, MD   BP 119/81 mmHg  Pulse 85  Temp(Src) 98.2 F (36.8 C) (Oral)  Resp 16  SpO2 100%  LMP 03/15/2014 Physical Exam  Constitutional: She is oriented to person, place, and time. She appears well-developed and well-nourished. No distress.  HENT:  Right Ear: External ear normal.  Left Ear: External ear normal.  Nose: Nose normal.  Mouth/Throat: Uvula is midline and mucous membranes are normal. Posterior oropharyngeal erythema present.  Eyes: Conjunctivae are normal. Pupils are equal, round, and reactive to light.  Neck: Normal range of motion. Neck supple.  Cardiovascular: Regular rhythm, normal heart sounds and intact distal pulses.   Pulmonary/Chest: Effort normal. She has rhonchi.  Abdominal: Soft. Bowel sounds are normal.  Lymphadenopathy:  She has no cervical adenopathy.  Neurological: She is alert and oriented to person, place, and time.  Skin: Skin is warm and dry.  Nursing note and vitals reviewed.   ED Course  Procedures (including critical care time) Labs Review Labs Reviewed - No data to display  Imaging Review Dg Chest 2 View  04/08/2014   CLINICAL DATA:  Two week history of cough and low grade fever.  EXAM: CHEST  2 VIEW  COMPARISON:  02/11/2014  FINDINGS: The heart size and mediastinal contours are within  normal limits. Both lungs are clear. The visualized skeletal structures are unremarkable.  IMPRESSION: Normal chest x-ray.   Electronically Signed   By: Marijo Sanes M.D.   On: 04/08/2014 14:47   X-rays reviewed and report per radiologist.   MDM   1. Acute recurrent pansinusitis        Billy Fischer, MD 04/08/14 1501

## 2014-06-03 ENCOUNTER — Emergency Department (HOSPITAL_COMMUNITY)
Admission: EM | Admit: 2014-06-03 | Discharge: 2014-06-03 | Disposition: A | Discharge status: Home / Self Care | Payer: 59 | Attending: Emergency Medicine | Admitting: Emergency Medicine

## 2014-06-03 ENCOUNTER — Encounter (HOSPITAL_COMMUNITY): Payer: Self-pay | Admitting: Emergency Medicine

## 2014-06-03 ENCOUNTER — Emergency Department (HOSPITAL_COMMUNITY): Payer: 59

## 2014-06-03 DIAGNOSIS — Z880 Allergy status to penicillin: Secondary | ICD-10-CM | POA: Diagnosis not present

## 2014-06-03 DIAGNOSIS — M1990 Unspecified osteoarthritis, unspecified site: Secondary | ICD-10-CM | POA: Insufficient documentation

## 2014-06-03 DIAGNOSIS — R102 Pelvic and perineal pain: Secondary | ICD-10-CM | POA: Diagnosis present

## 2014-06-03 DIAGNOSIS — Z8619 Personal history of other infectious and parasitic diseases: Secondary | ICD-10-CM | POA: Insufficient documentation

## 2014-06-03 DIAGNOSIS — I10 Essential (primary) hypertension: Secondary | ICD-10-CM | POA: Diagnosis not present

## 2014-06-03 DIAGNOSIS — Z79899 Other long term (current) drug therapy: Secondary | ICD-10-CM | POA: Diagnosis not present

## 2014-06-03 DIAGNOSIS — M545 Low back pain: Secondary | ICD-10-CM | POA: Insufficient documentation

## 2014-06-03 DIAGNOSIS — Z7951 Long term (current) use of inhaled steroids: Secondary | ICD-10-CM | POA: Diagnosis not present

## 2014-06-03 DIAGNOSIS — M810 Age-related osteoporosis without current pathological fracture: Secondary | ICD-10-CM | POA: Insufficient documentation

## 2014-06-03 DIAGNOSIS — Z3202 Encounter for pregnancy test, result negative: Secondary | ICD-10-CM | POA: Insufficient documentation

## 2014-06-03 DIAGNOSIS — Z8639 Personal history of other endocrine, nutritional and metabolic disease: Secondary | ICD-10-CM | POA: Diagnosis not present

## 2014-06-03 DIAGNOSIS — Z8669 Personal history of other diseases of the nervous system and sense organs: Secondary | ICD-10-CM | POA: Diagnosis not present

## 2014-06-03 DIAGNOSIS — N739 Female pelvic inflammatory disease, unspecified: Secondary | ICD-10-CM | POA: Diagnosis not present

## 2014-06-03 DIAGNOSIS — Z7982 Long term (current) use of aspirin: Secondary | ICD-10-CM | POA: Insufficient documentation

## 2014-06-03 DIAGNOSIS — R109 Unspecified abdominal pain: Secondary | ICD-10-CM

## 2014-06-03 LAB — URINALYSIS, ROUTINE W REFLEX MICROSCOPIC
Glucose, UA: NEGATIVE mg/dL
KETONES UR: NEGATIVE mg/dL
Leukocytes, UA: NEGATIVE
NITRITE: NEGATIVE
PH: 5.5 (ref 5.0–8.0)
Protein, ur: NEGATIVE mg/dL
Specific Gravity, Urine: 1.027 (ref 1.005–1.030)
UROBILINOGEN UA: 1 mg/dL (ref 0.0–1.0)

## 2014-06-03 LAB — CBC WITH DIFFERENTIAL/PLATELET
Basophils Absolute: 0 10*3/uL (ref 0.0–0.1)
Basophils Relative: 0 % (ref 0–1)
Eosinophils Absolute: 0.1 10*3/uL (ref 0.0–0.7)
Eosinophils Relative: 2 % (ref 0–5)
HCT: 46.3 % — ABNORMAL HIGH (ref 36.0–46.0)
Hemoglobin: 15.6 g/dL — ABNORMAL HIGH (ref 12.0–15.0)
LYMPHS ABS: 2 10*3/uL (ref 0.7–4.0)
LYMPHS PCT: 26 % (ref 12–46)
MCH: 30.6 pg (ref 26.0–34.0)
MCHC: 33.7 g/dL (ref 30.0–36.0)
MCV: 90.8 fL (ref 78.0–100.0)
MONOS PCT: 9 % (ref 3–12)
Monocytes Absolute: 0.7 10*3/uL (ref 0.1–1.0)
Neutro Abs: 4.8 10*3/uL (ref 1.7–7.7)
Neutrophils Relative %: 63 % (ref 43–77)
Platelets: 200 10*3/uL (ref 150–400)
RBC: 5.1 MIL/uL (ref 3.87–5.11)
RDW: 12.3 % (ref 11.5–15.5)
WBC: 7.6 10*3/uL (ref 4.0–10.5)

## 2014-06-03 LAB — WET PREP, GENITAL
Clue Cells Wet Prep HPF POC: NONE SEEN
Trich, Wet Prep: NONE SEEN
Yeast Wet Prep HPF POC: NONE SEEN

## 2014-06-03 LAB — BASIC METABOLIC PANEL
Anion gap: 10 (ref 5–15)
BUN: 8 mg/dL (ref 6–23)
CALCIUM: 9.1 mg/dL (ref 8.4–10.5)
CO2: 20 mmol/L (ref 19–32)
Chloride: 103 mmol/L (ref 96–112)
Creatinine, Ser: 0.93 mg/dL (ref 0.50–1.10)
GFR, EST AFRICAN AMERICAN: 89 mL/min — AB (ref >90)
GFR, EST NON AFRICAN AMERICAN: 76 mL/min — AB (ref >90)
GLUCOSE: 178 mg/dL — AB (ref 70–99)
Potassium: 3.3 mmol/L — ABNORMAL LOW (ref 3.5–5.1)
Sodium: 133 mmol/L — ABNORMAL LOW (ref 135–145)

## 2014-06-03 LAB — URINE MICROSCOPIC-ADD ON

## 2014-06-03 LAB — POC URINE PREG, ED: Preg Test, Ur: NEGATIVE

## 2014-06-03 MED ORDER — DOXYCYCLINE HYCLATE 100 MG PO CAPS
100.0000 mg | ORAL_CAPSULE | Freq: Two times a day (BID) | ORAL | Status: DC
Start: 2014-06-03 — End: 2015-05-31

## 2014-06-03 MED ORDER — LIDOCAINE HCL (PF) 1 % IJ SOLN
1.8000 mL | Freq: Once | INTRAMUSCULAR | Status: AC
  Administered 2014-06-03: 1.8 mL

## 2014-06-03 MED ORDER — DOXYCYCLINE HYCLATE 100 MG PO CAPS: 100.0000 mg | ORAL_CAPSULE | Freq: Two times a day (BID) | ORAL | Status: DC

## 2014-06-03 MED ORDER — PROMETHAZINE HCL 25 MG/ML IJ SOLN
12.5000 mg | Freq: Once | INTRAMUSCULAR | Status: AC
  Administered 2014-06-03: 12.5 mg via INTRAVENOUS
  Filled 2014-06-03: qty 1

## 2014-06-03 MED ORDER — METRONIDAZOLE 500 MG PO TABS: 500.0000 mg | ORAL_TABLET | Freq: Two times a day (BID) | ORAL | Status: DC

## 2014-06-03 MED ORDER — DOXYCYCLINE HYCLATE 100 MG PO TABS
100.0000 mg | ORAL_TABLET | Freq: Once | ORAL | Status: AC
  Administered 2014-06-03: 100 mg via ORAL
  Filled 2014-06-03: qty 1

## 2014-06-03 MED ORDER — FENTANYL CITRATE (PF) 100 MCG/2ML IJ SOLN
100.0000 ug | Freq: Once | INTRAMUSCULAR | Status: AC
  Administered 2014-06-03: 100 ug via INTRAVENOUS
  Filled 2014-06-03: qty 2

## 2014-06-03 MED ORDER — KETOROLAC TROMETHAMINE 30 MG/ML IJ SOLN
30.0000 mg | Freq: Once | INTRAMUSCULAR | Status: AC
  Administered 2014-06-03: 30 mg via INTRAVENOUS
  Filled 2014-06-03: qty 1

## 2014-06-03 MED ORDER — HYDROMORPHONE HCL 1 MG/ML IJ SOLN
1.0000 mg | Freq: Once | INTRAMUSCULAR | Status: AC
  Administered 2014-06-03: 1 mg via INTRAVENOUS
  Filled 2014-06-03: qty 1

## 2014-06-03 MED ORDER — OXYCODONE-ACETAMINOPHEN 5-325 MG PO TABS: 1.0000 | ORAL_TABLET | Freq: Four times a day (QID) | ORAL | Status: DC | PRN

## 2014-06-03 MED ORDER — CEFTRIAXONE SODIUM 250 MG IJ SOLR
250.0000 mg | Freq: Once | INTRAMUSCULAR | Status: AC
  Administered 2014-06-03: 250 mg via INTRAMUSCULAR
  Filled 2014-06-03: qty 250

## 2014-06-03 MED ORDER — NAPROXEN 500 MG PO TABS: 500.0000 mg | ORAL_TABLET | Freq: Two times a day (BID) | ORAL | Status: DC

## 2014-06-03 MED ORDER — ONDANSETRON HCL 4 MG/2ML IJ SOLN
4.0000 mg | Freq: Once | INTRAMUSCULAR | Status: AC
  Administered 2014-06-03: 4 mg via INTRAVENOUS
  Filled 2014-06-03: qty 2

## 2014-06-03 MED ORDER — PROMETHAZINE HCL 25 MG PO TABS: 25.0000 mg | ORAL_TABLET | Freq: Four times a day (QID) | ORAL | Status: DC | PRN

## 2014-06-03 MED ORDER — SODIUM CHLORIDE 0.9 % IV BOLUS (SEPSIS)
1000.0000 mL | Freq: Once | INTRAVENOUS | Status: AC
  Administered 2014-06-03: 1000 mL via INTRAVENOUS

## 2014-06-03 MED ORDER — LIDOCAINE HCL (PF) 1 % IJ SOLN
INTRAMUSCULAR | Status: AC
  Filled 2014-06-03: qty 5

## 2014-06-03 NOTE — ED Notes (Signed)
Pt continues to c/o nausea. Was able to tolerate sandwich and apple sauce with no emesis.

## 2014-06-03 NOTE — ED Notes (Signed)
Pt tender to touch to left lower flank.

## 2014-06-03 NOTE — ED Provider Notes (Signed)
CSN: [id number]     Arrival date & time 06/03/14  5400 History   First MD Initiated Contact with Patient 06/03/14 [phone]     Chief Complaint  Patient presents with  . Flank Pain     (Consider location/radiation/quality/duration/timing/severity/associated sxs/prior Treatment) HPI Comments: Patient presents with c/o of L flank pain, acute onset, waxing and waning for 2 days. No injury. Pain is severe at times and patient cannot find a comfortable position. It is associated with nausea and vomiting. No fever. Pain radiates to left pelvis. She denies vaginal discharge or bleeding. She has had dysuria and increased urinary frequency. No hematuria. No diarrhea. Patient has used over-the-counter medications without relief at home. She has never had a kidney stone before. Patient is sexually active with one partner for the past 6 years. She is uncertain as to her last menstrual period. History of tubal ligation. Aggravating factors: none. Alleviating factors: sitting in a bathtub.    Patient is a 39 y.o. female presenting with flank pain. The history is provided by the patient.  Flank Pain Associated symptoms include abdominal pain, nausea and vomiting. Pertinent negatives include no chest pain, coughing, fever, headaches, myalgias, rash or sore throat.    Past Medical History  Diagnosis Date  . Seizures   . Osteoporosis   . Osteoarthritis   . Epilepsy   . Hypertension   . Thyroid disease   . Hepatitis B    Past Surgical History  Procedure Laterality Date  . Head hardware removal  Pt has a plate in her head  . Tubal ligation     History reviewed. No pertinent family history. History  Substance Use Topics  . Smoking status: Never Smoker   . Smokeless tobacco: Not on file  . Alcohol Use: Yes   OB History    No data available     Review of Systems  Constitutional: Negative for fever.  HENT: Negative for rhinorrhea and sore throat.   Eyes: Negative for redness.  Respiratory:  Negative for cough.   Cardiovascular: Negative for chest pain.  Gastrointestinal: Positive for nausea, vomiting and abdominal pain. Negative for diarrhea.  Genitourinary: Positive for dysuria, frequency and flank pain. Negative for hematuria, vaginal bleeding and vaginal discharge.  Musculoskeletal: Negative for myalgias.  Skin: Negative for rash.  Neurological: Negative for headaches.    Allergies  Penicillins and Morphine and related  Home Medications   Prior to Admission medications   Medication Sig Start Date End Date Taking? Authorizing Provider  ASPIRIN PO Take 2 tablets by mouth daily.    Historical Provider, MD  calcium carbonate (TUMS - DOSED IN MG ELEMENTAL CALCIUM) 500 MG chewable tablet Chew 1 tablet by mouth daily.    Historical Provider, MD  HYDROcodone-acetaminophen (NORCO/VICODIN) 5-325 MG per tablet Take 2 tablets by mouth every 4 (four) hours as needed. 02/11/14   Ezequiel Essex, MD  ibuprofen (ADVIL,MOTRIN) 800 MG tablet Take 1 tablet (800 mg total) by mouth 3 (three) times daily. 02/11/14   Ezequiel Essex, MD  ipratropium (ATROVENT) 0.06 % nasal spray Place 2 sprays into both nostrils 4 (four) times daily. 04/08/14   Billy Fischer, MD  minocycline (MINOCIN,DYNACIN) 100 MG capsule Take 1 capsule (100 mg total) by mouth 2 (two) times daily. 04/08/14   Billy Fischer, MD  omeprazole (PRILOSEC) 20 MG capsule Take 1 capsule (20 mg total) by mouth daily. 02/11/14   Ezequiel Essex, MD   BP 121/83 mmHg  Pulse 100  Temp(Src) 97.9 F (36.6  C) (Oral)  Resp 18  Ht 5\' 4"  (1.626 m)  Wt 160 lb (72.576 kg)  BMI 27.45 kg/m2  SpO2 100%  LMP  (LMP Unknown)   Physical Exam  Constitutional: She appears well-developed and well-nourished.  HENT:  Head: Normocephalic and atraumatic.  Eyes: Conjunctivae are normal. Right eye exhibits no discharge. Left eye exhibits no discharge.  Neck: Normal range of motion. Neck supple.  Cardiovascular: Normal rate, regular rhythm and normal  heart sounds.   Pulmonary/Chest: Effort normal and breath sounds normal.  Abdominal: Soft. There is tenderness in the right lower quadrant, suprapubic area and left lower quadrant. There is CVA tenderness (Left). There is no rebound and no guarding.  Genitourinary: There is no rash or tenderness on the right labia. There is no rash or tenderness on the left labia. Uterus is tender. Cervix exhibits motion tenderness. Cervix exhibits no discharge and no friability. Right adnexum displays no mass, no tenderness and no fullness. Left adnexum displays tenderness. Left adnexum displays no mass and no fullness. No signs of injury around the vagina. Vaginal discharge (thick white, minimal) found.  Musculoskeletal:       Cervical back: She exhibits normal range of motion, no tenderness and no bony tenderness.       Thoracic back: She exhibits normal range of motion, no tenderness and no bony tenderness.       Lumbar back: She exhibits tenderness. She exhibits normal range of motion, no bony tenderness and no swelling.       Back:  Neurological: She is alert.  Skin: Skin is warm and dry.  Psychiatric: She has a normal mood and affect.  Nursing note and vitals reviewed.   ED Course  Procedures (including critical care time) Labs Review Labs Reviewed  WET PREP, GENITAL - Abnormal; Notable for the following:    WBC, Wet Prep HPF POC FEW (*)    All other components within normal limits  BASIC METABOLIC PANEL - Abnormal; Notable for the following:    Sodium 133 (*)    Potassium 3.3 (*)    Glucose, Bld 178 (*)    GFR calc non Af Amer 76 (*)    GFR calc Af Amer 89 (*)    All other components within normal limits  URINALYSIS, ROUTINE W REFLEX MICROSCOPIC - Abnormal; Notable for the following:    Color, Urine AMBER (*)    APPearance CLOUDY (*)    Hgb urine dipstick TRACE (*)    Bilirubin Urine SMALL (*)    All other components within normal limits  CBC WITH DIFFERENTIAL/PLATELET - Abnormal; Notable  for the following:    Hemoglobin 15.6 (*)    HCT 46.3 (*)    All other components within normal limits  URINE MICROSCOPIC-ADD ON - Abnormal; Notable for the following:    Bacteria, UA FEW (*)    All other components within normal limits  POC URINE PREG, ED  GC/CHLAMYDIA PROBE AMP ([place])    Imaging Review US Transvaginal Non-ob  06/03/2014   CLINICAL DATA:  Patient with left lower quadrant pelvic pain and irregular menses.  EXAM: TRANSABDOMINAL AND TRANSVAGINAL ULTRASOUND OF PELVIS  DOPPLER ULTRASOUND OF OVARIES  TECHNIQUE: Both transabdominal and transvaginal ultrasound examinations of the pelvis were performed. Transabdominal technique was performed for global imaging of the pelvis including uterus, ovaries, adnexal regions, and pelvic cul-de-sac.  It was necessary to proceed with endovaginal exam following the transabdominal exam to visualize the adnexal structures. Color and duplex Doppler ultrasound was  utilized to evaluate blood flow to the ovaries.  COMPARISON:  Pelvic CT 03/17/2012  FINDINGS: Uterus  Measurements: 7.9 x 4.4 x 4.8 cm. No fibroids or other mass visualized.  Endometrium  Thickness: 7 mm.  No focal abnormality visualized.  Right ovary  Measurements: 2.8 x 2.3 x 2.1 cm. Corpus luteum within the right ovary.  Left ovary  Measurements: 2.8 x 1.6 x 1.8 cm. Normal appearance/no adnexal mass.  Pulsed Doppler evaluation of both ovaries demonstrates normal low-resistance arterial and venous waveforms.  Other findings  No free fluid.  IMPRESSION: Normal appearance to the ovaries bilaterally. Arterial venous waveforms are demonstrated. No sonographic evidence to suggest torsion.  No significant free fluid in the pelvis.   Electronically Signed   By: Lovey Newcomer M.D.   On: 06/03/2014 13:14   US Pelvis Complete  06/03/2014   CLINICAL DATA:  Patient with left lower quadrant pelvic pain and irregular menses.  EXAM: TRANSABDOMINAL AND TRANSVAGINAL ULTRASOUND OF PELVIS  DOPPLER ULTRASOUND  OF OVARIES  TECHNIQUE: Both transabdominal and transvaginal ultrasound examinations of the pelvis were performed. Transabdominal technique was performed for global imaging of the pelvis including uterus, ovaries, adnexal regions, and pelvic cul-de-sac.  It was necessary to proceed with endovaginal exam following the transabdominal exam to visualize the adnexal structures. Color and duplex Doppler ultrasound was utilized to evaluate blood flow to the ovaries.  COMPARISON:  Pelvic CT 03/17/2012  FINDINGS: Uterus  Measurements: 7.9 x 4.4 x 4.8 cm. No fibroids or other mass visualized.  Endometrium  Thickness: 7 mm.  No focal abnormality visualized.  Right ovary  Measurements: 2.8 x 2.3 x 2.1 cm. Corpus luteum within the right ovary.  Left ovary  Measurements: 2.8 x 1.6 x 1.8 cm. Normal appearance/no adnexal mass.  Pulsed Doppler evaluation of both ovaries demonstrates normal low-resistance arterial and venous waveforms.  Other findings  No free fluid.  IMPRESSION: Normal appearance to the ovaries bilaterally. Arterial venous waveforms are demonstrated. No sonographic evidence to suggest torsion.  No significant free fluid in the pelvis.   Electronically Signed   By: Lovey Newcomer M.D.   On: 06/03/2014 13:14   [name] Art/ven Flow Abd Pelv Doppler  06/03/2014   CLINICAL DATA:  Patient with left lower quadrant pelvic pain and irregular menses.  EXAM: TRANSABDOMINAL AND TRANSVAGINAL ULTRASOUND OF PELVIS  DOPPLER ULTRASOUND OF OVARIES  TECHNIQUE: Both transabdominal and transvaginal ultrasound examinations of the pelvis were performed. Transabdominal technique was performed for global imaging of the pelvis including uterus, ovaries, adnexal regions, and pelvic cul-de-sac.  It was necessary to proceed with endovaginal exam following the transabdominal exam to visualize the adnexal structures. Color and duplex Doppler ultrasound was utilized to evaluate blood flow to the ovaries.  COMPARISON:  Pelvic CT 03/17/2012  FINDINGS:  Uterus  Measurements: 7.9 x 4.4 x 4.8 cm. No fibroids or other mass visualized.  Endometrium  Thickness: 7 mm.  No focal abnormality visualized.  Right ovary  Measurements: 2.8 x 2.3 x 2.1 cm. Corpus luteum within the right ovary.  Left ovary  Measurements: 2.8 x 1.6 x 1.8 cm. Normal appearance/no adnexal mass.  Pulsed Doppler evaluation of both ovaries demonstrates normal low-resistance arterial and venous waveforms.  Other findings  No free fluid.  IMPRESSION: Normal appearance to the ovaries bilaterally. Arterial venous waveforms are demonstrated. No sonographic evidence to suggest torsion.  No significant free fluid in the pelvis.   Electronically Signed   By: Lovey Newcomer M.D.   On: 06/03/2014 13:14  Ct Renal Stone Study  06/03/2014   CLINICAL DATA:  Pt reports left sided flank pain since Friday. Pt reports slight painful urination, and scant urination. Pt also reports nausea. Pt has not eaten today. Tender to touch Left lower flank. No h/o kidney stones. H/o tubal ligation. No h/o cancer.  EXAM: CT ABDOMEN AND PELVIS WITHOUT CONTRAST  TECHNIQUE: Multidetector CT imaging of the abdomen and pelvis was performed following the standard protocol without IV contrast.  COMPARISON:  06/12/2008  FINDINGS: There is ill-defined density projecting along the renal pyramids of both kidneys which may be due to renal tubular calcinosis. There are no discrete intrarenal stones. There is no hydronephrosis on either side and both ureters are normal course and caliber with no stones. There are no renal masses. Bladder is unremarkable.  Lung bases essentially clear.  Heart is normal in size.  Small low-density lesions in the liver are smaller than they were on the prior CT. The series are still likely cysts. No other liver abnormality.  Spleen, gallbladder and pancreas are unremarkable. No bile duct dilation. No adrenal masses.  Bilateral tubal ligation clips. Uterus and adnexa are otherwise unremarkable.  No pathologically  enlarged lymph nodes. No abnormal fluid collections.  No adenopathy.  No abnormal fluid collections.  There are few scattered colonic diverticula. No diverticulitis. Colon otherwise unremarkable. Normal small bowel. Normal appendix.  There is a fat containing paraumbilical hernia. No bowel enters this. It is also stable from the prior exam.  No significant bony abnormality.  IMPRESSION: 1. No acute findings. 2. No ureteral stones or hydronephrosis. No obstructive uropathy. Cause of the patient's left flank pain is unclear. 3. 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 4. Benign low-density liver lesions, likely cysts. 5. Scattered colonic diverticula without diverticulitis. 6. Small stable paraumbilical fat containing hernia.   Electronically Signed   By: Lajean Manes M.D.   On: 06/03/2014 13:58     EKG Interpretation None       9:00 AM Patient seen and examined. Work-up initiated. Medications ordered. Will get labs and UA to decide what further testing is indicated.   Vital signs reviewed and are as follows: BP 121/83 mmHg  Pulse 100  Temp(Src) 97.9 F (36.6 C) (Oral)  Resp 18  Ht 5\' 4"  (1.626 m)  Wt 160 lb (72.576 kg)  BMI 27.45 kg/m2  SpO2 100%  LMP  (LMP Unknown)  10:59 AM Pelvic exam performed with NT chaperone. Additional pain medication ordered. [name] ordered given results of pelvic exam.   [name] did not show cause of pain. CT ordered.   CT does not show stone or other cause of pain. She was given fentanyl and phenergan for continued pain and nausea. This improved her symptoms. She tolerated a sandwich.  Patient discussed with and seen by Dr. Mingo Amber.   We discussed all results today including negative ultrasound which does not demonstrate torsion, ovarian cyst, tubo-ovarian abscess;  CT which does not show a kidney stone or other intra-abdominal pathology. Given cervical motion tenderness and adnexal tenderness, feel that treatment for PID is  appropriate at this time. No fever, elevated white count, uncontrolled symptoms to necessitate hospital admission at this time. Patient agrees with this plan. Patient was given first dose of doxycycline and Rocephin in emergency department. She will be discharged to home on 2 weeks of doxycycline and Flagyl for treatment of PID. She agrees to follow up with her primary care physician in the next 1-2 days for  a recheck. She was given a GYN referral to [facility] outpatient clinic. Patient encouraged to return with worsening symptoms, uncontrolled pain, uncontrolled vomiting, fever, change in symptoms or other concerns. Patient is comfortable with discharge to home with this plan.  Patient counseled on use of narcotic pain medications. Counseled not to combine these medications with others containing tylenol. Urged not to drink alcohol, drive, or perform any other activities that requires focus while taking these medications. The patient verbalizes understanding and agrees with the plan.  4:13 PM Prior to discharge, symptoms controlled, patient appears well. O2 sat was 98% on monitor.     MDM   Final diagnoses:  Pelvic pain in female  Flank pain  Pelvic inflammatory disease   Work-up and treatment as discussed above. Patient stable for d/c to home. Follow-up plan in place.     Carlisle Cater, PA-C 06/03/14 1613  Evelina Bucy, MD 06/04/14 [phone]

## 2014-06-03 NOTE — ED Notes (Signed)
Pt reports left sided flank pain since Friday. Pt reports slight painful urination, and scant urination. Pt also reports nausea. Pt has not eaten today.

## 2014-06-03 NOTE — ED Notes (Signed)
Pt ambulated to the bathroom.  

## 2014-06-03 NOTE — Discharge Instructions (Signed)
Please read and follow all provided instructions.  Your diagnoses today include:  1. Pelvic inflammatory disease   2. Pelvic pain in female   3. Flank pain     Tests performed today include:  Blood counts and electrolytes - normal  Blood tests to check kidney function  Urine test to look for infection and pregnancy (in women) - no serious infection or pregnancy  CT scan - no kidney stone  Pelvic ultrasound - no ovarian cyst, torsion or abscess in fallopian tubes  Vital signs. See below for your results today.   Medications prescribed:   Doxycycline - antibiotic  You have been prescribed an antibiotic medicine: take the entire course of medicine even if you are feeling better. Stopping early can cause the antibiotic not to work.   Metronidazole - antibiotic  You have been prescribed an antibiotic medicine: take the entire course of medicine even if you are feeling better. Stopping early can cause the antibiotic not to work. Do not drink alcohol when taking this medication.    Percocet (oxycodone/acetaminophen) - narcotic pain medication  DO NOT drive or perform any activities that require you to be awake and alert because this medicine can make you drowsy. BE VERY CAREFUL not to take multiple medicines containing Tylenol (also called acetaminophen). Doing so can lead to an overdose which can damage your liver and cause liver failure and possibly death.   Naproxen - anti-inflammatory pain medication  Do not exceed 500mg  naproxen every 12 hours, take with food  You have been prescribed an anti-inflammatory medication or NSAID. Take with food. Take smallest effective dose for the shortest duration needed for your pain. Stop taking if you experience stomach pain or vomiting.    Phenergan (promethazine) - for nausea and vomiting  Take any prescribed medications only as directed.  Home care instructions:   Follow any educational materials contained in this  packet.  Follow-up instructions: Please follow-up with your primary care provider in the next 2 days for further evaluation of your symptoms.    Follow-up with GYN referral in 1 week.   Return instructions:  SEEK IMMEDIATE MEDICAL ATTENTION IF:  The pain does not go away or becomes severe   A temperature above 101F develops   Repeated vomiting occurs (multiple episodes)   The pain becomes localized to portions of the abdomen. The right side could possibly be appendicitis. In an adult, the left lower portion of the abdomen could be colitis or diverticulitis.   Blood is being passed in stools or vomit (bright red or black tarry stools)   You develop chest pain, difficulty breathing, dizziness or fainting, or become confused, poorly responsive, or inconsolable (young children)  If you have any other emergent concerns regarding your health  Additional Information: Abdominal (belly) pain can be caused by many things. Your caregiver performed an examination and possibly ordered blood/urine tests and imaging (CT scan, x-rays, ultrasound). Many cases can be observed and treated at home after initial evaluation in the emergency department. Even though you are being discharged home, abdominal pain can be unpredictable. Therefore, you need a repeated exam if your pain does not resolve, returns, or worsens. Most patients with abdominal pain don't have to be admitted to the hospital or have surgery, but serious problems like appendicitis and gallbladder attacks can start out as nonspecific pain. Many abdominal conditions cannot be diagnosed in one visit, so follow-up evaluations are very important.  Your vital signs today were: BP 116/79 mmHg   Pulse  76   Temp(Src) 97.9 F (36.6 C) (Oral)   Resp 23   Ht 5\' 4"  (1.626 m)   Wt 160 lb (72.576 kg)   BMI 27.45 kg/m2   SpO2 89%   LMP  (LMP Unknown) If your blood pressure (bp) was elevated above 135/85 this visit, please have this repeated by your doctor  within one month. --------------

## 2014-06-04 LAB — GC/CHLAMYDIA PROBE AMP (~~LOC~~) NOT AT ARMC
CHLAMYDIA, DNA PROBE: NEGATIVE
Neisseria Gonorrhea: NEGATIVE

## 2014-12-06 ENCOUNTER — Encounter: Payer: Self-pay | Admitting: Internal Medicine

## 2015-05-31 ENCOUNTER — Emergency Department (HOSPITAL_COMMUNITY): Payer: BLUE CROSS/BLUE SHIELD

## 2015-05-31 ENCOUNTER — Encounter (HOSPITAL_COMMUNITY): Payer: Self-pay

## 2015-05-31 ENCOUNTER — Emergency Department (HOSPITAL_COMMUNITY)
Admission: EM | Admit: 2015-05-31 | Discharge: 2015-05-31 | Disposition: A | Discharge status: Home / Self Care | Payer: BLUE CROSS/BLUE SHIELD | Attending: Emergency Medicine | Admitting: Emergency Medicine

## 2015-05-31 DIAGNOSIS — M1990 Unspecified osteoarthritis, unspecified site: Secondary | ICD-10-CM | POA: Insufficient documentation

## 2015-05-31 DIAGNOSIS — Z8619 Personal history of other infectious and parasitic diseases: Secondary | ICD-10-CM | POA: Insufficient documentation

## 2015-05-31 DIAGNOSIS — I10 Essential (primary) hypertension: Secondary | ICD-10-CM | POA: Diagnosis not present

## 2015-05-31 DIAGNOSIS — Z3202 Encounter for pregnancy test, result negative: Secondary | ICD-10-CM | POA: Insufficient documentation

## 2015-05-31 DIAGNOSIS — F419 Anxiety disorder, unspecified: Secondary | ICD-10-CM | POA: Insufficient documentation

## 2015-05-31 DIAGNOSIS — G43001 Migraine without aura, not intractable, with status migrainosus: Secondary | ICD-10-CM | POA: Insufficient documentation

## 2015-05-31 DIAGNOSIS — Z880 Allergy status to penicillin: Secondary | ICD-10-CM | POA: Diagnosis not present

## 2015-05-31 DIAGNOSIS — R079 Chest pain, unspecified: Secondary | ICD-10-CM | POA: Insufficient documentation

## 2015-05-31 DIAGNOSIS — R51 Headache: Secondary | ICD-10-CM | POA: Diagnosis present

## 2015-05-31 DIAGNOSIS — Z8639 Personal history of other endocrine, nutritional and metabolic disease: Secondary | ICD-10-CM | POA: Insufficient documentation

## 2015-05-31 LAB — BASIC METABOLIC PANEL
Anion gap: 10 (ref 5–15)
BUN: 10 mg/dL (ref 6–20)
CO2: 19 mmol/L — ABNORMAL LOW (ref 22–32)
CREATININE: 0.71 mg/dL (ref 0.44–1.00)
Calcium: 9.1 mg/dL (ref 8.9–10.3)
Chloride: 109 mmol/L (ref 101–111)
GFR calc Af Amer: >60 mL/min (ref >60)
Glucose, Bld: 113 mg/dL — ABNORMAL HIGH (ref 65–99)
Potassium: 3.4 mmol/L — ABNORMAL LOW (ref 3.5–5.1)
SODIUM: 138 mmol/L (ref 135–145)

## 2015-05-31 LAB — POC URINE PREG, ED: Preg Test, Ur: NEGATIVE

## 2015-05-31 LAB — CBC
HCT: 44.2 % (ref 36.0–46.0)
Hemoglobin: 15.1 g/dL — ABNORMAL HIGH (ref 12.0–15.0)
MCH: 30.9 pg (ref 26.0–34.0)
MCHC: 34.2 g/dL (ref 30.0–36.0)
MCV: 90.4 fL (ref 78.0–100.0)
PLATELETS: 238 10*3/uL (ref 150–400)
RBC: 4.89 MIL/uL (ref 3.87–5.11)
RDW: 12.5 % (ref 11.5–15.5)
WBC: 8.8 10*3/uL (ref 4.0–10.5)

## 2015-05-31 LAB — D-DIMER, QUANTITATIVE: D-Dimer, Quant: <0.27 ug/mL-FEU (ref 0.00–0.50)

## 2015-05-31 LAB — I-STAT TROPONIN, ED
Troponin i, poc: 0 ng/mL (ref 0.00–0.08)
Troponin i, poc: 0 ng/mL (ref 0.00–0.08)

## 2015-05-31 MED ORDER — METOCLOPRAMIDE HCL 10 MG PO TABS: 10.0000 mg | ORAL_TABLET | Freq: Four times a day (QID) | ORAL | Status: DC

## 2015-05-31 MED ORDER — DIVALPROEX SODIUM 250 MG PO DR TAB
500.0000 mg | DELAYED_RELEASE_TABLET | Freq: Two times a day (BID) | ORAL | Status: DC
  Administered 2015-05-31: 500 mg via ORAL
  Filled 2015-05-31: qty 2

## 2015-05-31 MED ORDER — DEXAMETHASONE SODIUM PHOSPHATE 10 MG/ML IJ SOLN
10.0000 mg | Freq: Once | INTRAMUSCULAR | Status: AC
  Administered 2015-05-31: 10 mg via INTRAVENOUS
  Filled 2015-05-31: qty 1

## 2015-05-31 MED ORDER — ACETAMINOPHEN 500 MG PO TABS
1000.0000 mg | ORAL_TABLET | Freq: Once | ORAL | Status: AC
  Administered 2015-05-31: 1000 mg via ORAL
  Filled 2015-05-31: qty 2

## 2015-05-31 MED ORDER — SODIUM CHLORIDE 0.9 % IV BOLUS (SEPSIS)
500.0000 mL | Freq: Once | INTRAVENOUS | Status: AC
  Administered 2015-05-31: 500 mL via INTRAVENOUS

## 2015-05-31 MED ORDER — KETOROLAC TROMETHAMINE 30 MG/ML IJ SOLN
30.0000 mg | Freq: Once | INTRAMUSCULAR | Status: AC
Start: 2015-05-31 — End: 2015-05-31
  Administered 2015-05-31: 30 mg via INTRAVENOUS
  Filled 2015-05-31: qty 1

## 2015-05-31 NOTE — ED Notes (Signed)
Patient transported to X-ray 

## 2015-05-31 NOTE — ED Notes (Signed)
Pt reports HA, onset last Saturday. She has hx of Migraines. She also reports hearing a "robotic hum in my head." She also reports left sided sharp chest pain that stated a while ago but states the pain has gotten worse. She also reports anxiety.

## 2015-05-31 NOTE — ED Provider Notes (Addendum)
CSN: [id number]     Arrival date & time 05/31/15  0059 History   First MD Initiated Contact with Patient 05/31/15 0501     Chief Complaint  Patient presents with  . Headache  . Chest Pain     (Consider location/radiation/quality/duration/timing/severity/associated sxs/prior Treatment) Patient is a 40 y.o. female presenting with migraines and chest pain. The history is provided by the patient.  Migraine This is a recurrent problem. The current episode started more than 1 week ago. The problem occurs constantly. The problem has not changed since onset.Associated symptoms include chest pain and headaches. Pertinent negatives include no abdominal pain and no shortness of breath. Nothing aggravates the symptoms. Nothing relieves the symptoms. She has tried nothing for the symptoms. The treatment provided no relief.  Chest Pain Pain location:  Substernal area Pain quality: sharp   Pain radiates to:  Does not radiate Pain radiates to the back: no   Onset quality:  Gradual Timing:  Constant Progression:  Unchanged Chronicity:  New Context: no trauma   Relieved by:  Nothing Worsened by:  Nothing tried Ineffective treatments:  None tried Associated symptoms: anxiety and headache   Associated symptoms: no abdominal pain, no altered mental status, no back pain, no cough, no diaphoresis, no dizziness, no dysphagia, no fever, no nausea, no near-syncope, no numbness, no palpitations, no shortness of breath, not vomiting and no weakness   Risk factors: no aortic disease and no surgery   nothing atypical about headache.  Lost insurance for a while and couldn't follow up  Past Medical History  Diagnosis Date  . Seizures ([place])   . Osteoporosis   . Osteoarthritis   . Epilepsy ([place])   . Hypertension   . Thyroid disease   . Hepatitis B    Past Surgical History  Procedure Laterality Date  . Head hardware removal  Pt has a plate in her head  . Tubal ligation     No family history on  file. Social History  Substance Use Topics  . Smoking status: Never Smoker   . Smokeless tobacco: None  . Alcohol Use: Yes   OB History    No data available     Review of Systems  Constitutional: Negative for fever and diaphoresis.  HENT: Negative for trouble swallowing and voice change.   Eyes: Negative for photophobia and visual disturbance.  Respiratory: Negative for cough, chest tightness and shortness of breath.   Cardiovascular: Positive for chest pain. Negative for palpitations, leg swelling and near-syncope.  Gastrointestinal: Negative for nausea, vomiting and abdominal pain.  Musculoskeletal: Negative for back pain, neck pain and neck stiffness.  Neurological: Positive for headaches. Negative for dizziness, tremors, seizures, facial asymmetry, speech difficulty, weakness, light-headedness and numbness.  Psychiatric/Behavioral: The patient is nervous/anxious.   All other systems reviewed and are negative.     Allergies  Penicillins and Morphine and related  Home Medications   Prior to Admission medications   Medication Sig Start Date End Date Taking? Authorizing Provider  acetaminophen (TYLENOL) 325 MG tablet Take 650 mg by mouth every 6 (six) hours as needed for moderate pain.   Yes Historical Provider, MD   BP 107/68 mmHg  Pulse 81  Temp(Src) 98 F (36.7 C) (Oral)  Resp 16  Ht 5\' 4"  (1.626 m)  Wt 163 lb 7 oz (74.135 kg)  BMI 28.04 kg/m2  SpO2 98%  LMP 05/18/2015 Physical Exam  Constitutional: She is oriented to person, place, and time. She appears well-developed and well-nourished. No distress.  Sitting in the room comfortably with all the lights on  HENT:  Head: Normocephalic and atraumatic.  Mouth/Throat: Oropharynx is clear and moist.  Eyes: Conjunctivae are normal. Pupils are equal, round, and reactive to light.  Neck: Normal range of motion. Neck supple.  Cardiovascular: Normal rate, regular rhythm and intact distal pulses.   Pulmonary/Chest:  Effort normal and breath sounds normal. No respiratory distress. She has no wheezes. She has no rales.  Abdominal: Soft. Bowel sounds are normal. There is no tenderness. There is no rebound and no guarding.  Musculoskeletal: Normal range of motion.  Neurological: She is alert and oriented to person, place, and time. She displays normal reflexes. No cranial nerve deficit. Coordination normal.  Skin: Skin is warm and dry.  Psychiatric: Her mood appears anxious.    ED Course  Procedures (including critical care time) Labs Review Labs Reviewed  BASIC METABOLIC PANEL - Abnormal; Notable for the following:    Potassium 3.4 (*)    CO2 19 (*)    Glucose, Bld 113 (*)    All other components within normal limits  CBC - Abnormal; Notable for the following:    Hemoglobin 15.1 (*)    All other components within normal limits  D-DIMER, QUANTITATIVE (NOT AT [facility])  Randolm Idol, ED  POC URINE PREG, ED  Randolm Idol, ED    Imaging Review Dg Chest 2 View  05/31/2015  CLINICAL DATA:  Left-sided headache, 5 days duration. EXAM: CHEST  2 VIEW COMPARISON:  04/08/2014 FINDINGS: The heart size and mediastinal contours are within normal limits. Both lungs are clear. The visualized skeletal structures are unremarkable. IMPRESSION: No active cardiopulmonary disease. Electronically Signed   By: Andreas Newport M.D.   On: 05/31/2015 03:07   Ct Head Wo Contrast  05/31/2015  CLINICAL DATA:  Headache for 5 days. EXAM: CT HEAD WITHOUT CONTRAST TECHNIQUE: Contiguous axial images were obtained from the base of the skull through the vertex without intravenous contrast. COMPARISON:  08/27/2013 FINDINGS: There is no intracranial hemorrhage, mass or evidence of acute infarction. There is no extra-axial fluid collection. Gray matter and white matter appear normal. Cerebral volume is normal for age. Brainstem and posterior fossa are unremarkable. The CSF spaces appear normal. The bony structures are remarkable only  for prior left temporal bone resection and plate. The visible portions of the paranasal sinuses are clear. IMPRESSION: Normal brain Electronically Signed   By: Andreas Newport M.D.   On: 05/31/2015 05:37   I have personally reviewed and evaluated these images and lab results as part of my medical decision-making.   EKG Interpretation  Date/Time:  Friday Chole Driver 14 2017 01:12:17 EDT Ventricular Rate:  103 PR Interval:  132 QRS Duration: 78 QT Interval:  338 QTC Calculation: 442 R Axis:   54 Text Interpretation:  Sinus tachycardia Confirmed by [facility]  MD, Osiris Charles ([id number]) on 05/31/2015 6:13:34 AM        MDM   Final diagnoses:  None   Results for orders placed or performed during the hospital encounter of [id number]  Basic metabolic panel  Result Value Ref Range   Sodium 138 135 - 145 mmol/L   Potassium 3.4 (L) 3.5 - 5.1 mmol/L   Chloride 109 101 - 111 mmol/L   CO2 19 (L) 22 - 32 mmol/L   Glucose, Bld 113 (H) 65 - 99 mg/dL   BUN 10 6 - 20 mg/dL   Creatinine, Ser 0.71 0.44 - 1.00 mg/dL   Calcium 9.1 8.9 - 10.3 mg/dL  GFR calc non Af Amer >60 >60 mL/min   GFR calc Af Amer >60 >60 mL/min   Anion gap 10 5 - 15  CBC  Result Value Ref Range   WBC 8.8 4.0 - 10.5 K/uL   RBC 4.89 3.87 - 5.11 MIL/uL   Hemoglobin 15.1 (H) 12.0 - 15.0 g/dL   HCT 44.2 36.0 - 46.0 %   MCV 90.4 78.0 - 100.0 fL   MCH 30.9 26.0 - 34.0 pg   MCHC 34.2 30.0 - 36.0 g/dL   RDW 12.5 11.5 - 15.5 %   Platelets 238 150 - 400 K/uL  D-dimer, quantitative (not at [facility])  Result Value Ref Range   D-Dimer, Quant <0.27 0.00 - 0.50 ug/mL-FEU  I-stat troponin, ED  Result Value Ref Range   Troponin i, poc 0.00 0.00 - 0.08 ng/mL   Comment 3          POC Urine Pregnancy, ED (do NOT order at [facility])  Result Value Ref Range   Preg Test, Ur NEGATIVE NEGATIVE  I-stat troponin, ED  Result Value Ref Range   Troponin i, poc 0.00 0.00 - 0.08 ng/mL   Comment 3           Dg Chest 2 View  05/31/2015  CLINICAL DATA:   Left-sided headache, 5 days duration. EXAM: CHEST  2 VIEW COMPARISON:  04/08/2014 FINDINGS: The heart size and mediastinal contours are within normal limits. Both lungs are clear. The visualized skeletal structures are unremarkable. IMPRESSION: No active cardiopulmonary disease. Electronically Signed   By: Andreas Newport M.D.   On: 05/31/2015 03:07   Ct Head Wo Contrast  05/31/2015  CLINICAL DATA:  Headache for 5 days. EXAM: CT HEAD WITHOUT CONTRAST TECHNIQUE: Contiguous axial images were obtained from the base of the skull through the vertex without intravenous contrast. COMPARISON:  08/27/2013 FINDINGS: There is no intracranial hemorrhage, mass or evidence of acute infarction. There is no extra-axial fluid collection. Gray matter and white matter appear normal. Cerebral volume is normal for age. Brainstem and posterior fossa are unremarkable. The CSF spaces appear normal. The bony structures are remarkable only for prior left temporal bone resection and plate. The visible portions of the paranasal sinuses are clear. IMPRESSION: Normal brain Electronically Signed   By: Andreas Newport M.D.   On: 05/31/2015 05:37    Medications  divalproex (DEPAKOTE) DR tablet 500 mg (500 mg Oral Given 05/31/15 0607)  ketorolac (TORADOL) 30 MG/ML injection 30 mg (30 mg Intravenous Given 05/31/15 0609)  dexamethasone (DECADRON) injection 10 mg (10 mg Intravenous Given 05/31/15 0609)  sodium chloride 0.9 % bolus 500 mL (500 mLs Intravenous New Bag/Given 05/31/15 0609)   Was given non sedating medication as she drove here and has to drive self home.    Ruled out for MI with normal EKG and 2 negative troponins.  Ddimer is negative in an extremely low risk patient.  I suspect this was an anxiety attacks the patient reports secondary to ongoing pain.  This is a typical migraine for the patient but despite 7 days of pain she is AO4 and is sitting comfortably in the room with all the lights on and has taken nothing.  She is  scheduled to follow up with her PMD to get a referral back to Dr. Maia Petties to get back on her suppressive medications.  Follow up with your PMD for recheck,strict return precautions given    Tien Aispuro, MD 05/31/15 [id number]  Veatrice Kells, MD 05/31/15 [phone]

## 2015-05-31 NOTE — ED Notes (Signed)
MD at bedside. 

## 2015-05-31 NOTE — Discharge Instructions (Signed)
Migraine Headache  A migraine headache is very bad, throbbing pain on one or both sides of your head. Talk to your doctor about what things may bring on (trigger) your migraine headaches.  HOME CARE  · Only take medicines as told by your doctor.  · Lie down in a dark, quiet room when you have a migraine.  · Keep a journal to find out if certain things bring on migraine headaches. For example, write down:    What you eat and drink.    How much sleep you get.    Any change to your diet or medicines.  · Lessen how much alcohol you drink.  · Quit smoking if you smoke.  · Get enough sleep.  · Lessen any stress in your life.  · Keep lights dim if bright lights bother you or make your migraines worse.  GET HELP RIGHT AWAY IF:   · Your migraine becomes really bad.  · You have a fever.  · You have a stiff neck.  · You have trouble seeing.  · Your muscles are weak, or you lose muscle control.  · You lose your balance or have trouble walking.  · You feel like you will pass out (faint), or you pass out.  · You have really bad symptoms that are different than your first symptoms.  MAKE SURE YOU:   · Understand these instructions.  · Will watch your condition.  · Will get help right away if you are not doing well or get worse.     This information is not intended to replace advice given to you by your health care provider. Make sure you discuss any questions you have with your health care provider.     Document Released: 11/12/2007 Document Revised: 04/27/2011 Document Reviewed: 10/10/2012  Elsevier Interactive Patient Education ©2016 [employer]

## 2015-07-05 ENCOUNTER — Other Ambulatory Visit: Payer: Self-pay | Admitting: Physician Assistant

## 2015-07-05 DIAGNOSIS — Z1231 Encounter for screening mammogram for malignant neoplasm of breast: Secondary | ICD-10-CM

## 2015-07-09 ENCOUNTER — Encounter: Payer: Self-pay | Admitting: Neurology

## 2015-07-09 ENCOUNTER — Ambulatory Visit (INDEPENDENT_AMBULATORY_CARE_PROVIDER_SITE_OTHER): Payer: Medicaid Other | Admitting: Neurology

## 2015-07-09 VITALS — BP 110/78 | HR 86 | Resp 20 | Ht 64.0 in | Wt 177.0 lb

## 2015-07-09 DIAGNOSIS — G40309 Generalized idiopathic epilepsy and epileptic syndromes, not intractable, without status epilepticus: Secondary | ICD-10-CM

## 2015-07-09 DIAGNOSIS — G43101 Migraine with aura, not intractable, with status migrainosus: Secondary | ICD-10-CM

## 2015-07-09 DIAGNOSIS — G44301 Post-traumatic headache, unspecified, intractable: Secondary | ICD-10-CM | POA: Diagnosis not present

## 2015-07-09 DIAGNOSIS — G40A09 Absence epileptic syndrome, not intractable, without status epilepticus: Secondary | ICD-10-CM

## 2015-07-09 DIAGNOSIS — F980 Enuresis not due to a substance or known physiological condition: Secondary | ICD-10-CM | POA: Diagnosis not present

## 2015-07-09 DIAGNOSIS — F489 Nonpsychotic mental disorder, unspecified: Secondary | ICD-10-CM | POA: Diagnosis not present

## 2015-07-09 DIAGNOSIS — F5105 Insomnia due to other mental disorder: Secondary | ICD-10-CM

## 2015-07-09 DIAGNOSIS — R431 Parosmia: Secondary | ICD-10-CM

## 2015-07-09 MED ORDER — ETHOSUXIMIDE 250 MG PO CAPS: 250.0000 mg | ORAL_CAPSULE | Freq: Two times a day (BID) | ORAL | Status: DC
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
## 2015-07-09 NOTE — Patient Instructions (Signed)
Ethosuximide capsules What is this medicine? ETHOSUXIMIDE (eth oh SUX i mide) is used to control seizures in certain types of epilepsy. This medicine may be used for other purposes; ask your health care provider or pharmacist if you have questions. What should I tell my health care provider before I take this medicine? They need to know if you have any of these conditions: -blood disorders or disease -kidney disease -liver disease -suicidal thoughts, plans, or attempt; a previous suicide attempt by you or a family member -an unusual or allergic reaction to ethosuximide, other medicines, foods, dyes, or preservatives -pregnant or trying to get pregnant -breast-feeding How should I use this medicine? Take this medicine by mouth with a glass of water. Follow the directions on the prescription label. Take your medicine at regular intervals. Do not take it more often than directed. Do not stop taking except on your doctor's advice. A special MedGuide will be given to you by the pharmacist with each prescription and refill. Be sure to read this information carefully each time. Talk to your pediatrician regarding the use of this medicine in children. While this medicine may be prescribed for children as young as 26 years of age for selected conditions, precautions do apply. Overdosage: If you think you have taken too much of this medicine contact a poison control center or emergency room at once. NOTE: This medicine is only for you. Do not share this medicine with others. What if I miss a dose? If you miss a dose, take it as soon as you can. If it is almost time for your next dose, take only that dose. Do not take double or extra doses. What may interact with this medicine? -other seizure or epilepsy medicines This list may not describe all possible interactions. Give your health care provider a list of all the medicines, herbs, non-prescription drugs, or dietary supplements you use. Also tell them if  you smoke, drink alcohol, or use illegal drugs. Some items may interact with your medicine. What should I watch for while using this medicine? Visit your doctor or health care professional for a regular check on your progress. Wear a medical ID bracelet or chain, and carry a card that describes your disease and details of your medicine and dosage times. You may get drowsy, dizzy, or have blurred vision. Do not drive, use machinery, or do anything that needs mental alertness until you know how this medicine affects you. To reduce dizzy or fainting spells, do not sit or stand up quickly, especially if you are an older patient. Alcohol can increase drowsiness and dizziness. Avoid alcoholic drinks. The use of this medicine may increase the chance of suicidal thoughts or actions. Pay special attention to how you are responding while on this medicine. Any worsening of mood, or thoughts of suicide or dying should be reported to your health care professional right away. Women who become pregnant while using this medicine may enroll in the [place] Pregnancy Registry by calling [phone]. This registry collects information about the safety of antiepileptic drug use during pregnancy. What side effects may I notice from receiving this medicine? Side effects that you should report to your doctor or health care professional as soon as possible: -allergic reactions like skin rash, itching or hives, swelling of the face, lips, or tongue -breathing problems -fever, sore throat, swollen glands -muscle aches and pain -redness, blistering, peeling or loosening of the skin, including inside the mouth -unusual bleeding or bruising -unusually weak or  tired -worsening of mood, thoughts or actions of suicide or dying Side effects that usually do not require medical attention (report to your doctor or health care professional if they continue or are bothersome): -headache -loss of appetite or  weight loss -nausea, vomiting -stomach cramps This list may not describe all possible side effects. Call your doctor for medical advice about side effects. You may report side effects to FDA at 1-800-FDA-1088. Where should I keep my medicine? Keep out of reach of children. Store at room temperature between 15 and 30 degrees C (59 and 86 degrees F). Throw away any unused medicine after the expiration date. NOTE: This sheet is a summary. It may not cover all possible information. If you have questions about this medicine, talk to your doctor, pharmacist, or health care provider.    2016, Elsevier/Gold Standard. (2008-12-28 19:11:02)

## 2015-07-16 ENCOUNTER — Telehealth: Payer: Self-pay

## 2015-07-16 NOTE — Telephone Encounter (Signed)
Form completed for [place], Dr. Brett Fairy completed paper and signed, front and back, sent to [facility] for processing.

## 2015-07-26 ENCOUNTER — Telehealth: Payer: Self-pay | Admitting: Neurology

## 2015-07-26 ENCOUNTER — Telehealth: Payer: Self-pay | Admitting: *Deleted

## 2015-07-26 NOTE — Telephone Encounter (Signed)
Faxed records to [facility] 07/26/15 and rec'vd confirmation 07/26/15 @ 1452

## 2015-07-26 NOTE — Telephone Encounter (Signed)
Pt dept of Social Service from is @ the front desk for pick up.

## 2015-08-22 ENCOUNTER — Telehealth: Payer: Self-pay | Admitting: Neurology

## 2015-08-22 NOTE — Telephone Encounter (Signed)
Patient is calling to find out when her prolonged EEG will be scheduled?

## 2015-08-22 NOTE — Telephone Encounter (Signed)
Called pt to schedule Prolonged EEG per Kristin's request.  Order in the chart says Adult EEG and didn't specify "Prolonged" but in voicemail advised patient to ask for a 2 Hour prolonged appt.

## 2015-09-02 NOTE — Telephone Encounter (Signed)
ONO order sent to [facility].

## 2015-09-02 NOTE — Telephone Encounter (Signed)
I called pt to advise her that the ONO order has been sent to [facility] and they will call her to schedule. Pt's phone was very fuzzy when she picked up and I couldn't hear her after several attempts to talk. I will call her back later.

## 2015-09-02 NOTE — Telephone Encounter (Signed)
Pt returned Kristen's call. I advised pt of Kristen's msg, ok'd per [place].

## 2015-09-02 NOTE — Telephone Encounter (Signed)
Pt's called,scheduled prolonged EEG is 7/26, return OV is 8/17. Pt needs pulse oximettry scheduled. Thank you

## 2015-09-09 ENCOUNTER — Ambulatory Visit: Payer: Medicaid Other | Admitting: Neurology

## 2015-09-11 ENCOUNTER — Other Ambulatory Visit: Payer: Medicaid Other

## 2015-09-12 ENCOUNTER — Telehealth: Payer: Self-pay

## 2015-09-12 ENCOUNTER — Encounter: Payer: Self-pay | Admitting: Neurology

## 2015-09-12 NOTE — Telephone Encounter (Signed)
I spoke to Kristin Braun and advised her that her ONO results were normal and no oxygen is needed. Kristin Braun verbalized understanding of results. Kristin Braun had no questions at this time but was encouraged to call back if questions arise.

## 2015-09-12 NOTE — Telephone Encounter (Signed)
No need for oxygen, indeed.  8 desaturations per hour are not high. CD

## 2015-09-12 NOTE — Telephone Encounter (Signed)
Received ONO results- See below and a copy of results are at my desk. Is it ok to let her know that these are normal?  SpO2 data: <88%- 1.1 min <89%- 1.5 mn High SpO2: 99% Low SpO2: 82% Basal SpO2: 93.6% Time consecutive <88%: 0.8% Awake SpO2: 94% Artifact events: 1.7 min  Pulse Data: High pulse: 85 Low pulse: 43  Oxygen Desaturation Index: Total desaturation events: 64 Average events per hour: 8  Considerations: It appears this pt does NOT qualify for Nocturnal Oxygen per Medicare guidelines.

## 2015-10-02 ENCOUNTER — Emergency Department (HOSPITAL_COMMUNITY): Payer: Medicaid Other

## 2015-10-02 ENCOUNTER — Emergency Department (HOSPITAL_COMMUNITY)
Admission: EM | Admit: 2015-10-02 | Discharge: 2015-10-02 | Disposition: A | Discharge status: Home / Self Care | Payer: Medicaid Other | Attending: Emergency Medicine | Admitting: Emergency Medicine

## 2015-10-02 ENCOUNTER — Encounter (HOSPITAL_COMMUNITY): Payer: Self-pay | Admitting: Emergency Medicine

## 2015-10-02 DIAGNOSIS — I10 Essential (primary) hypertension: Secondary | ICD-10-CM | POA: Insufficient documentation

## 2015-10-02 DIAGNOSIS — Z79899 Other long term (current) drug therapy: Secondary | ICD-10-CM | POA: Insufficient documentation

## 2015-10-02 DIAGNOSIS — G43909 Migraine, unspecified, not intractable, without status migrainosus: Secondary | ICD-10-CM | POA: Diagnosis not present

## 2015-10-02 DIAGNOSIS — G43009 Migraine without aura, not intractable, without status migrainosus: Secondary | ICD-10-CM

## 2015-10-02 HISTORY — DX: Unspecified intracranial injury with loss of consciousness status unknown, initial encounter: S06.9XAA

## 2015-10-02 HISTORY — DX: Unspecified intracranial injury with loss of consciousness of unspecified duration, initial encounter: S06.9X9A

## 2015-10-02 LAB — I-STAT CHEM 8, ED
BUN: 16 mg/dL (ref 6–20)
CALCIUM ION: 1.18 mmol/L (ref 1.13–1.30)
Chloride: 103 mmol/L (ref 101–111)
Creatinine, Ser: 0.7 mg/dL (ref 0.44–1.00)
Glucose, Bld: 114 mg/dL — ABNORMAL HIGH (ref 65–99)
HCT: 49 % — ABNORMAL HIGH (ref 36.0–46.0)
HEMOGLOBIN: 16.7 g/dL — AB (ref 12.0–15.0)
Potassium: 3.6 mmol/L (ref 3.5–5.1)
SODIUM: 139 mmol/L (ref 135–145)
TCO2: 22 mmol/L (ref 0–100)

## 2015-10-02 MED ORDER — SODIUM CHLORIDE 0.9 % IV BOLUS (SEPSIS)
1000.0000 mL | Freq: Once | INTRAVENOUS | Status: AC
  Administered 2015-10-02: 1000 mL via INTRAVENOUS

## 2015-10-02 MED ORDER — ACETAMINOPHEN 325 MG PO TABS
650.0000 mg | ORAL_TABLET | Freq: Once | ORAL | Status: AC
  Administered 2015-10-02: 650 mg via ORAL
  Filled 2015-10-02: qty 2

## 2015-10-02 MED ORDER — METOCLOPRAMIDE HCL 5 MG/ML IJ SOLN
10.0000 mg | Freq: Once | INTRAMUSCULAR | Status: AC
  Administered 2015-10-02: 10 mg via INTRAVENOUS
  Filled 2015-10-02: qty 2

## 2015-10-02 MED ORDER — DIPHENHYDRAMINE HCL 50 MG/ML IJ SOLN
50.0000 mg | Freq: Once | INTRAMUSCULAR | Status: AC
  Administered 2015-10-02: 50 mg via INTRAVENOUS
  Filled 2015-10-02: qty 1
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
## 2015-10-02 NOTE — ED Notes (Signed)
Patient is alert and oriented x3.  She was given DC instructions and follow up visit instructions.  Patient gave verbal understanding. She was DC ambulatory under her own power to home.  V/S stable.  He was not showing any signs of distress on DC 

## 2015-10-02 NOTE — ED Triage Notes (Signed)
Pt brought in by EMS with c/o migraine headache  Pt states pain started today around noon  Pt states this is not like her normal headache  Pt states the pain is in the back of her head and is like a pinching where her normal headache is on one side of her head and is like a throbbing  Pt states she has had nausea and vomiting with the headache and is c/o photosensitivity

## 2015-10-02 NOTE — Discharge Instructions (Signed)
Contact your neurologist tomorrow for follow-up. Tell him about your daily headaches and visit here tonight

## 2015-10-03 ENCOUNTER — Ambulatory Visit: Payer: Medicaid Other | Admitting: Neurology

## 2015-10-10 ENCOUNTER — Ambulatory Visit (INDEPENDENT_AMBULATORY_CARE_PROVIDER_SITE_OTHER): Payer: Medicaid Other | Admitting: Neurology

## 2015-10-10 DIAGNOSIS — F5105 Insomnia due to other mental disorder: Secondary | ICD-10-CM

## 2015-10-10 DIAGNOSIS — F980 Enuresis not due to a substance or known physiological condition: Secondary | ICD-10-CM

## 2015-10-10 DIAGNOSIS — G43101 Migraine with aura, not intractable, with status migrainosus: Secondary | ICD-10-CM

## 2015-10-10 DIAGNOSIS — G44301 Post-traumatic headache, unspecified, intractable: Secondary | ICD-10-CM

## 2015-10-10 DIAGNOSIS — G40A09 Absence epileptic syndrome, not intractable, without status epilepticus: Secondary | ICD-10-CM

## 2015-10-10 DIAGNOSIS — G40309 Generalized idiopathic epilepsy and epileptic syndromes, not intractable, without status epilepticus: Secondary | ICD-10-CM | POA: Diagnosis not present

## 2015-10-10 DIAGNOSIS — R431 Parosmia: Secondary | ICD-10-CM

## 2015-10-18 NOTE — Procedures (Signed)
[facility]  EEG (ELECTROENCEPHALOGRAM) REPORT   STUDY DATE:  10-02-2015    PATIENT NAME: Kristin Braun   ORDERING CLINICIAN: Larey Seat, MD   TECHNOLOGIST:  TECHNIQUE: Electroencephalogram was recorded utilizing standard 10-20 system of lead placement and reformatted into average and bipolar montages.      RECORDING TIME: 75 minutes   ACTIVATION:  strobe lights and hyperventilation    CLINICAL INFORMATION: spells of loss of awareness     FINDINGS: EEG with a dominant posterior background rhythm of 10 hertz .EKG heart rates 68 bpm in NSR.   Photic stimulation caused  blink artifact, no evidence of epileptiform discharges, periodic changes. Patient recorded in the awake/ drowsy / sleep state. Photic stimulation/ Hyperventilation were both included. There was a normal expected amplitude buildup.  IMPRESSION:  This EEG study is normal for the patient's age and conscious state.       Larey Seat , MD

## 2015-10-31 ENCOUNTER — Encounter (HOSPITAL_COMMUNITY): Payer: Self-pay | Admitting: *Deleted

## 2015-10-31 ENCOUNTER — Emergency Department (HOSPITAL_COMMUNITY): Payer: Medicaid Other

## 2015-10-31 DIAGNOSIS — G40909 Epilepsy, unspecified, not intractable, without status epilepticus: Secondary | ICD-10-CM | POA: Insufficient documentation

## 2015-10-31 DIAGNOSIS — G9340 Encephalopathy, unspecified: Secondary | ICD-10-CM | POA: Diagnosis present

## 2015-10-31 DIAGNOSIS — M797 Fibromyalgia: Secondary | ICD-10-CM | POA: Insufficient documentation

## 2015-10-31 DIAGNOSIS — I10 Essential (primary) hypertension: Secondary | ICD-10-CM | POA: Insufficient documentation

## 2015-10-31 DIAGNOSIS — Z880 Allergy status to penicillin: Secondary | ICD-10-CM | POA: Insufficient documentation

## 2015-10-31 DIAGNOSIS — Z9889 Other specified postprocedural states: Secondary | ICD-10-CM | POA: Insufficient documentation

## 2015-10-31 DIAGNOSIS — F319 Bipolar disorder, unspecified: Secondary | ICD-10-CM | POA: Insufficient documentation

## 2015-10-31 DIAGNOSIS — F4310 Post-traumatic stress disorder, unspecified: Secondary | ICD-10-CM | POA: Insufficient documentation

## 2015-10-31 DIAGNOSIS — Z885 Allergy status to narcotic agent status: Secondary | ICD-10-CM | POA: Diagnosis not present

## 2015-10-31 DIAGNOSIS — Z79899 Other long term (current) drug therapy: Secondary | ICD-10-CM | POA: Diagnosis not present

## 2015-10-31 DIAGNOSIS — I951 Orthostatic hypotension: Secondary | ICD-10-CM | POA: Diagnosis present

## 2015-10-31 DIAGNOSIS — E079 Disorder of thyroid, unspecified: Secondary | ICD-10-CM | POA: Insufficient documentation

## 2015-10-31 DIAGNOSIS — Z9851 Tubal ligation status: Secondary | ICD-10-CM | POA: Diagnosis not present

## 2015-10-31 DIAGNOSIS — F411 Generalized anxiety disorder: Secondary | ICD-10-CM | POA: Insufficient documentation

## 2015-10-31 DIAGNOSIS — Z87820 Personal history of traumatic brain injury: Secondary | ICD-10-CM | POA: Insufficient documentation

## 2015-10-31 DIAGNOSIS — R55 Syncope and collapse: Secondary | ICD-10-CM | POA: Diagnosis not present

## 2015-10-31 DIAGNOSIS — R569 Unspecified convulsions: Secondary | ICD-10-CM | POA: Diagnosis present

## 2015-10-31 DIAGNOSIS — Z833 Family history of diabetes mellitus: Secondary | ICD-10-CM | POA: Diagnosis not present

## 2015-10-31 DIAGNOSIS — M1990 Unspecified osteoarthritis, unspecified site: Secondary | ICD-10-CM | POA: Diagnosis not present

## 2015-10-31 DIAGNOSIS — G9389 Other specified disorders of brain: Secondary | ICD-10-CM | POA: Diagnosis not present

## 2015-10-31 DIAGNOSIS — G43909 Migraine, unspecified, not intractable, without status migrainosus: Secondary | ICD-10-CM | POA: Diagnosis not present

## 2015-10-31 LAB — COMPREHENSIVE METABOLIC PANEL
ALBUMIN: 4.3 g/dL (ref 3.5–5.0)
ALT: 11 U/L — AB (ref 14–54)
AST: 25 U/L (ref 15–41)
Alkaline Phosphatase: 50 U/L (ref 38–126)
Anion gap: 10 (ref 5–15)
BUN: 11 mg/dL (ref 6–20)
CHLORIDE: 105 mmol/L (ref 101–111)
CO2: 21 mmol/L — AB (ref 22–32)
CREATININE: 0.79 mg/dL (ref 0.44–1.00)
Calcium: 9.7 mg/dL (ref 8.9–10.3)
GFR calc Af Amer: >60 mL/min (ref >60)
GFR calc non Af Amer: >60 mL/min (ref >60)
GLUCOSE: 134 mg/dL — AB (ref 65–99)
POTASSIUM: 4.2 mmol/L (ref 3.5–5.1)
SODIUM: 136 mmol/L (ref 135–145)
Total Bilirubin: 1 mg/dL (ref 0.3–1.2)
Total Protein: 7.2 g/dL (ref 6.5–8.1)

## 2015-10-31 LAB — URINALYSIS, ROUTINE W REFLEX MICROSCOPIC
Glucose, UA: NEGATIVE mg/dL
Ketones, ur: 15 mg/dL — AB
Leukocytes, UA: NEGATIVE
Nitrite: NEGATIVE
PH: 6 (ref 5.0–8.0)
PROTEIN: NEGATIVE mg/dL
SPECIFIC GRAVITY, URINE: 1.031 — AB (ref 1.005–1.030)

## 2015-10-31 LAB — URINE MICROSCOPIC-ADD ON: WBC, UA: NONE SEEN WBC/hpf (ref 0–5)

## 2015-10-31 LAB — CBC WITH DIFFERENTIAL/PLATELET
BASOS ABS: 0 10*3/uL (ref 0.0–0.1)
Basophils Relative: 0 %
EOS ABS: 0.1 10*3/uL (ref 0.0–0.7)
Eosinophils Relative: 1 %
HCT: 50.2 % — ABNORMAL HIGH (ref 36.0–46.0)
HEMOGLOBIN: 17.3 g/dL — AB (ref 12.0–15.0)
Lymphocytes Relative: 45 %
Lymphs Abs: 3.3 10*3/uL (ref 0.7–4.0)
MCH: 31.6 pg (ref 26.0–34.0)
MCHC: 34.5 g/dL (ref 30.0–36.0)
MCV: 91.8 fL (ref 78.0–100.0)
Monocytes Absolute: 0.3 10*3/uL (ref 0.1–1.0)
Monocytes Relative: 5 %
NEUTROS PCT: 49 %
Neutro Abs: 3.6 10*3/uL (ref 1.7–7.7)
Platelets: 217 10*3/uL (ref 150–400)
RBC: 5.47 MIL/uL — AB (ref 3.87–5.11)
RDW: 12.3 % (ref 11.5–15.5)
WBC: 7.4 10*3/uL (ref 4.0–10.5)

## 2015-10-31 LAB — POC URINE PREG, ED: PREG TEST UR: NEGATIVE

## 2015-10-31 MED ORDER — ONDANSETRON 4 MG PO TBDP
4.0000 mg | ORAL_TABLET | Freq: Once | ORAL | Status: AC
  Administered 2015-10-31: 4 mg via ORAL

## 2015-10-31 MED ORDER — OXYCODONE-ACETAMINOPHEN 5-325 MG PO TABS
1.0000 | ORAL_TABLET | Freq: Once | ORAL | Status: AC
  Administered 2015-10-31: 1 via ORAL

## 2015-10-31 MED ORDER — ONDANSETRON 4 MG PO TBDP
ORAL_TABLET | ORAL | Status: AC
  Filled 2015-10-31: qty 1

## 2015-10-31 MED ORDER — OXYCODONE-ACETAMINOPHEN 5-325 MG PO TABS
ORAL_TABLET | ORAL | Status: AC
  Filled 2015-10-31: qty 1
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
## 2015-10-31 NOTE — ED Notes (Signed)
Patient becomes very dizzy when getting up  Unsteady on her feet

## 2015-11-01 ENCOUNTER — Encounter (HOSPITAL_COMMUNITY): Payer: Self-pay | Admitting: Family Medicine

## 2015-11-01 ENCOUNTER — Observation Stay (HOSPITAL_COMMUNITY)
Admission: EM | Admit: 2015-11-01 | Discharge: 2015-11-02 | Disposition: A | Discharge status: Home / Self Care | Payer: Medicaid Other | Attending: Internal Medicine | Admitting: Internal Medicine

## 2015-11-01 DIAGNOSIS — G4459 Other complicated headache syndrome: Secondary | ICD-10-CM

## 2015-11-01 DIAGNOSIS — R55 Syncope and collapse: Secondary | ICD-10-CM | POA: Diagnosis present

## 2015-11-01 DIAGNOSIS — S069XAA Unspecified intracranial injury with loss of consciousness status unknown, initial encounter: Secondary | ICD-10-CM | POA: Insufficient documentation

## 2015-11-01 DIAGNOSIS — F319 Bipolar disorder, unspecified: Secondary | ICD-10-CM | POA: Diagnosis present

## 2015-11-01 DIAGNOSIS — S069X9A Unspecified intracranial injury with loss of consciousness of unspecified duration, initial encounter: Secondary | ICD-10-CM | POA: Insufficient documentation

## 2015-11-01 DIAGNOSIS — IMO0002 Reserved for concepts with insufficient information to code with codable children: Secondary | ICD-10-CM | POA: Diagnosis present

## 2015-11-01 DIAGNOSIS — I951 Orthostatic hypotension: Principal | ICD-10-CM

## 2015-11-01 DIAGNOSIS — R51 Headache: Secondary | ICD-10-CM

## 2015-11-01 DIAGNOSIS — R569 Unspecified convulsions: Secondary | ICD-10-CM | POA: Diagnosis not present

## 2015-11-01 DIAGNOSIS — G9340 Encephalopathy, unspecified: Secondary | ICD-10-CM

## 2015-11-01 DIAGNOSIS — S069X9S Unspecified intracranial injury with loss of consciousness of unspecified duration, sequela: Secondary | ICD-10-CM

## 2015-11-01 DIAGNOSIS — G43709 Chronic migraine without aura, not intractable, without status migrainosus: Secondary | ICD-10-CM | POA: Diagnosis present

## 2015-11-01 DIAGNOSIS — R519 Headache, unspecified: Secondary | ICD-10-CM | POA: Diagnosis present

## 2015-11-01 LAB — VALPROIC ACID LEVEL: VALPROIC ACID LVL: 157 ug/mL — AB (ref 50.0–100.0)

## 2015-11-01 LAB — RAPID URINE DRUG SCREEN, HOSP PERFORMED
Amphetamines: NOT DETECTED
BENZODIAZEPINES: NOT DETECTED
Barbiturates: NOT DETECTED
COCAINE: POSITIVE — AB
OPIATES: NOT DETECTED
TETRAHYDROCANNABINOL: NOT DETECTED

## 2015-11-01 LAB — ETHANOL: Alcohol, Ethyl (B): <5 mg/dL (ref <5)

## 2015-11-01 LAB — AMMONIA: AMMONIA: 15 umol/L (ref 9–35)

## 2015-11-01 MED ORDER — TRAZODONE HCL 50 MG PO TABS
50.0000 mg | ORAL_TABLET | Freq: Every day | ORAL | Status: DC
  Administered 2015-11-01: 50 mg via ORAL
  Filled 2015-11-01: qty 1

## 2015-11-01 MED ORDER — ENOXAPARIN SODIUM 40 MG/0.4ML ~~LOC~~ SOLN
40.0000 mg | SUBCUTANEOUS | Status: DC
  Administered 2015-11-01 – 2015-11-02 (×2): 40 mg via SUBCUTANEOUS
  Filled 2015-11-01 (×2): qty 0.4

## 2015-11-01 MED ORDER — CALCIUM CARBONATE ANTACID 500 MG PO CHEW
1.0000 | CHEWABLE_TABLET | Freq: Every day | ORAL | Status: DC
Start: 2015-11-01 — End: 2015-11-02
  Administered 2015-11-01 – 2015-11-02 (×2): 200 mg via ORAL
  Filled 2015-11-01 (×2): qty 1

## 2015-11-01 MED ORDER — SODIUM CHLORIDE 0.9 % IV BOLUS (SEPSIS)
1000.0000 mL | Freq: Once | INTRAVENOUS | Status: AC
  Administered 2015-11-01: 1000 mL via INTRAVENOUS

## 2015-11-01 MED ORDER — ACETAMINOPHEN 325 MG PO TABS
650.0000 mg | ORAL_TABLET | Freq: Four times a day (QID) | ORAL | Status: DC | PRN
  Administered 2015-11-01: 650 mg via ORAL
  Filled 2015-11-01: qty 2

## 2015-11-01 MED ORDER — DIPHENHYDRAMINE HCL 50 MG/ML IJ SOLN
25.0000 mg | Freq: Once | INTRAMUSCULAR | Status: AC
  Administered 2015-11-01: 25 mg via INTRAVENOUS
  Filled 2015-11-01: qty 1

## 2015-11-01 MED ORDER — DULOXETINE HCL 60 MG PO CPEP
60.0000 mg | ORAL_CAPSULE | Freq: Every day | ORAL | Status: DC
  Administered 2015-11-01 – 2015-11-02 (×2): 60 mg via ORAL
  Filled 2015-11-01 (×2): qty 1

## 2015-11-01 MED ORDER — ETHOSUXIMIDE 250 MG PO CAPS
250.0000 mg | ORAL_CAPSULE | Freq: Two times a day (BID) | ORAL | Status: DC
  Administered 2015-11-01 – 2015-11-02 (×3): 250 mg via ORAL
  Filled 2015-11-01 (×3): qty 1

## 2015-11-01 MED ORDER — SODIUM CHLORIDE 0.9 % IV SOLN
INTRAVENOUS | Status: DC
  Administered 2015-11-01: 1 mL via INTRAVENOUS
  Administered 2015-11-02: 1000 mL via INTRAVENOUS

## 2015-11-01 MED ORDER — ACETAMINOPHEN 650 MG RE SUPP: 650.0000 mg | Freq: Four times a day (QID) | RECTAL | Status: DC | PRN

## 2015-11-01 MED ORDER — METOCLOPRAMIDE HCL 5 MG/ML IJ SOLN
10.0000 mg | Freq: Once | INTRAMUSCULAR | Status: AC
  Administered 2015-11-01: 10 mg via INTRAVENOUS
  Filled 2015-11-01: qty 2

## 2015-11-01 MED ORDER — PROMETHAZINE HCL 25 MG PO TABS
12.5000 mg | ORAL_TABLET | Freq: Four times a day (QID) | ORAL | Status: DC | PRN
Start: 2015-11-01 — End: 2015-11-02
  Administered 2015-11-01: 12.5 mg via ORAL
  Filled 2015-11-01: qty 1
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
## 2015-11-01 NOTE — Progress Notes (Signed)
Patient states headache 9/10 with sudden onset upon standing to ambulate to bathroom, ringing in left ear and nausea. Symptoms began 2030. See MAR. RN will continue to monitor.

## 2015-11-01 NOTE — ED Notes (Addendum)
Call mother with any questions or updates [place] [phone]

## 2015-11-01 NOTE — Progress Notes (Signed)
rn assisted pt to the bathroom. Pt slightly unsteady on feet, but able to ambulate to bathroom. gingerale given to pt.

## 2015-11-01 NOTE — ED Notes (Signed)
Josh PA and [employer] notified of pts orthostatic vitals. Informed Josh that we will have to wait on ambulating her due to her BP bottoming out and her HR going up to 141. RN starting IV and giving fluids first then will attempt to ambulate

## 2015-11-01 NOTE — ED Notes (Signed)
Admitting provider at bedside.

## 2015-11-01 NOTE — ED Provider Notes (Signed)
40 year old female with a two week history of near-syncope. In the ED, she shows significant orthostatic vital sign changes. Labs are unremarkable. She will be given IV fluids and rechecked.  Medical screening examination/treatment/procedure(s) were conducted as a shared visit with non-physician practitioner(s) and myself.  I personally evaluated the patient during the encounter.   EKG Interpretation  Date/Time:  Friday November 01 2015 02:34:02 EDT Ventricular Rate:  84 PR Interval:    QRS Duration: 87 QT Interval:  384 QTC Calculation: 454 R Axis:   58 Text Interpretation:  Sinus rhythm Borderline T abnormalities, anterior leads When compared with ECG of 05/31/2015, No significant change was found Confirmed by [facility]  MD, Meklit Cotta ([id number]) on 11/01/2015 2:43:23 AM         Delora Fuel, MD [id number]

## 2015-11-01 NOTE — ED Provider Notes (Signed)
[facility] DEPT Provider Note   CSN: [id number] Arrival date & time: 10/31/15  2037     History   Chief Complaint Chief Complaint  Patient presents with  . Headache  . Near Syncope    HPI Kristin Braun is a 40 y.o. female.  Patient with history of bipolar disorder on Depakote, seizure disorder on antiepileptics, fibromyalgia on Cymbalta, history of traumatic brain injury -- presents with 6 day history of dizziness (lightheadedness), near syncope. She states she'll be at rest and 'black out'. This has been happening several times per day. Patient has chronic headaches and has had a more severe headache over the past 2 weeks. Patient also complains of profound fatigue. She states that she has not been eating and drinking well and has spent most the day in bed. Family members have noticed a behavior change and states that she's been very unsteady on her feet. She has come close to falling several times. She has been more confused, forgetful, and will ask repeated questions. She has had her typical staring seizure spells. No vomiting or diarrhea. She has not had constellation of symptoms like this in the past. She has follow-up with her neurologist in 4 days. Patient denies signs of stroke including: facial droop, slurred speech, aphasia, weakness/numbness in extremities. The onset of this condition was acute. The course is constant.        Past Medical History:  Diagnosis Date  . Anxiety   . Bipolar 1 disorder ([place])   . Epilepsy ([place])   . Fibromyalgia   . Headache   . Hepatitis B   . Hypertension   . Osteoarthritis   . Osteoporosis   . PTSD (post-traumatic stress disorder)   . Seizures ([place])   . TBI (traumatic brain injury) ([place])   . Thyroid disease     There are no active problems to display for this patient.   Past Surgical History:  Procedure Laterality Date  . HEAD HARDWARE REMOVAL  Pt has a plate in her head  . TUBAL LIGATION      OB History    No data  available       Home Medications    Prior to Admission medications   Medication Sig Start Date End Date Taking? Authorizing Provider  calcium carbonate (TUMS - DOSED IN MG ELEMENTAL CALCIUM) 500 MG chewable tablet Chew 1 tablet by mouth daily.   Yes Historical Provider, MD  diphenhydrAMINE (BENADRYL) 25 mg capsule Take 25 mg by mouth every 6 (six) hours as needed for itching.   Yes Historical Provider, MD  divalproex (DEPAKOTE) 500 MG DR tablet Take 500 mg by mouth 2 (two) times daily.  06/27/15  Yes Historical Provider, MD  DULoxetine (CYMBALTA) 60 MG capsule Take 60 mg by mouth daily.   Yes Historical Provider, MD  ethosuximide (ZARONTIN) 250 MG capsule Take 1 capsule (250 mg total) by mouth 2 (two) times daily. 07/09/15  Yes Carmen Dohmeier, MD  hydrOXYzine (VISTARIL) 25 MG capsule Take 25 mg by mouth 2 (two) times daily as needed for anxiety.  06/06/15  Yes Historical Provider, MD  traZODone (DESYREL) 50 MG tablet Take 50 mg by mouth at bedtime.  06/27/15  Yes Historical Provider, MD    Family History Family History  Problem Relation Age of Onset  . Cancer Mother   . Diabetes Mellitus II Father     Social History Social History  Substance Use Topics  . Smoking status: Never Smoker  . Smokeless tobacco: Never  Used  . Alcohol use No     Allergies   Penicillins and Morphine and related   Review of Systems Review of Systems  Constitutional: Positive for fatigue. Negative for fever.  HENT: Negative for congestion, dental problem, rhinorrhea and sinus pressure.   Eyes: Negative for photophobia, discharge, redness and visual disturbance.  Respiratory: Negative for shortness of breath.   Cardiovascular: Negative for chest pain.  Gastrointestinal: Negative for nausea and vomiting.  Musculoskeletal: Positive for gait problem. Negative for neck pain and neck stiffness.  Skin: Negative for rash.  Neurological: Positive for light-headedness and headaches. Negative for syncope,  speech difficulty, weakness and numbness.  Psychiatric/Behavioral: Positive for confusion.     Physical Exam Updated Vital Signs BP 116/87   Pulse 86   Temp 98.2 F (36.8 C) (Oral)   Resp 11   Ht 5\' 4"  (1.626 m)   Wt 72.6 kg   LMP 08/18/2015 (Approximate)   SpO2 99%   BMI 27.46 kg/m   Physical Exam  Constitutional: She appears well-developed and well-nourished.  HENT:  Head: Normocephalic and atraumatic.  Right Ear: Tympanic membrane, external ear and ear canal normal.  Left Ear: Tympanic membrane, external ear and ear canal normal.  Nose: Nose normal.  Mouth/Throat: Uvula is midline, oropharynx is clear and moist and mucous membranes are normal.  Eyes: Conjunctivae, EOM and lids are normal. Pupils are equal, round, and reactive to light. Right eye exhibits no discharge. Left eye exhibits no discharge. Right eye exhibits no nystagmus. Left eye exhibits no nystagmus.  Neck: Normal range of motion. Neck supple.  No meningismus.  Cardiovascular: Normal rate, regular rhythm and normal heart sounds.   Pulmonary/Chest: Effort normal and breath sounds normal.  Abdominal: Soft. There is no tenderness.  Musculoskeletal:       Cervical back: She exhibits normal range of motion, no tenderness and no bony tenderness.  Neurological: She is alert. She has normal strength. No cranial nerve deficit or sensory deficit. Coordination abnormal. GCS eye subscore is 4. GCS verbal subscore is 5. GCS motor subscore is 6.  Patient unable to walk 2/2 orthostasis. She is able to perform finger-to-nose testing but with some difficulty and corrections. She has difficulty following commands.   Skin: Skin is warm and dry.  Psychiatric: She has a normal mood and affect.  Nursing note and vitals reviewed.    ED Treatments / Results  Labs (all labs ordered are listed, but only abnormal results are displayed) Labs Reviewed  CBC WITH DIFFERENTIAL/PLATELET - Abnormal; Notable for the following:        Result Value   RBC 5.47 (*)    Hemoglobin 17.3 (*)    HCT 50.2 (*)    All other components within normal limits  COMPREHENSIVE METABOLIC PANEL - Abnormal; Notable for the following:    CO2 21 (*)    Glucose, Bld 134 (*)    ALT 11 (*)    All other components within normal limits  URINALYSIS, ROUTINE W REFLEX MICROSCOPIC (NOT AT [facility]) - Abnormal; Notable for the following:    Color, Urine AMBER (*)    APPearance TURBID (*)    Specific Gravity, Urine 1.031 (*)    Hgb urine dipstick LARGE (*)    Bilirubin Urine SMALL (*)    Ketones, ur 15 (*)    All other components within normal limits  URINE MICROSCOPIC-ADD ON - Abnormal; Notable for the following:    Squamous Epithelial / LPF 0-5 (*)    Bacteria, UA  MANY (*)    All other components within normal limits  VALPROIC ACID LEVEL - Abnormal; Notable for the following:    Valproic Acid Lvl 157 (*)    All other components within normal limits  AMMONIA  ETHANOL  URINE RAPID DRUG SCREEN, HOSP PERFORMED  POC URINE PREG, ED    EKG  EKG Interpretation  Date/Time:  Friday November 01 2015 02:34:02 EDT Ventricular Rate:  84 PR Interval:    QRS Duration: 87 QT Interval:  384 QTC Calculation: 454 R Axis:   58 Text Interpretation:  Sinus rhythm Borderline T abnormalities, anterior leads When compared with ECG of 05/31/2015, No significant change was found Confirmed by [facility]  MD, DAVID ([id number]) on 11/01/2015 2:43:23 AM       Radiology Ct Head Wo Contrast  Result Date: 11/01/2015 CLINICAL DATA:  [address] and hit right frontal aspect of head less night. Dizziness. Loss of consciousness. Initial encounter. EXAM: CT HEAD WITHOUT CONTRAST TECHNIQUE: Contiguous axial images were obtained from the base of the skull through the vertex without intravenous contrast. COMPARISON:  10/02/2015 FINDINGS: Brain: Anterior left frontal lobe encephalomalacia is unchanged. There is no evidence of acute cortical infarct, intracranial hemorrhage, mass, midline  shift, or extra-axial fluid collection. Ventricles are normal in size. Vascular: No hyperdense vessel or unexpected calcification. Skull: Defect in the left orbital roof consistent with remote trauma. Postsurgical changes involving the squamousal left temporal bone. No acute fracture identified. Sinuses/Orbits: No acute finding. Other: None. IMPRESSION: 1. No evidence of acute intracranial abnormality. 2. Unchanged left frontal lobe encephalomalacia consistent with remote trauma. Electronically Signed   By: Logan Bores M.D.   On: 11/01/2015 00:04    Procedures Procedures (including critical care time)  Medications Ordered in ED Medications  ondansetron (ZOFRAN-ODT) 4 MG disintegrating tablet (not administered)  oxyCODONE-acetaminophen (PERCOCET/ROXICET) 5-325 MG per tablet (not administered)  sodium chloride 0.9 % bolus 1,000 mL (1,000 mLs Intravenous New Bag/Given 11/01/15 0245)  ondansetron (ZOFRAN-ODT) disintegrating tablet 4 mg (4 mg Oral Given 10/31/15 2115)  oxyCODONE-acetaminophen (PERCOCET/ROXICET) 5-325 MG per tablet 1 tablet (1 tablet Oral Given 10/31/15 2116)     Initial Impression / Assessment and Plan / ED Course  I have reviewed the triage vital signs and the nursing notes.  Pertinent labs & imaging results that were available during my care of the patient were reviewed by me and considered in my medical decision making (see chart for details).  Clinical Course   Patient seen and examined. Work-up initiated. Fluids ordered. Pt d/w Dr. Roxanne Mins who has seen.   Vital signs reviewed and are as follows: BP 116/87   Pulse 86   Temp 98.2 F (36.8 C) (Oral)   Resp 11   Ht 5\' 4"  (1.626 m)   Wt 72.6 kg   LMP 08/18/2015 (Approximate)   SpO2 99%   BMI 27.46 kg/m   Patient With significant orthostasis. She was given 1 L fluids. She remains orthostatic and is unable to walk.  Given altered level of consciousness and orthostasis, will admit for observation. Ammonia checked and is  normal. Depakote level noted to be elevated.  Discussed case with Dr. Loleta Books who will admit. Bed requested.  Final Clinical Impressions(s) / ED Diagnoses   Final diagnoses:  Orthostatic hypotension  Encephalopathy acute   Admit.   New Prescriptions New Prescriptions   No medications on file     Carlisle Cater, PA-C [id number]    Delora Fuel, MD [id number]

## 2015-11-01 NOTE — Progress Notes (Signed)
Progress note  Patient seen and evaluated. Please see H&P for detailed HPI.   She states that she was able to sleep after getting reglan and diphenhydramine this morning. Her headache is now much improved. She states that she was quite dizzy and had a "blackout" spells at home. She has not had these episodes while in the hospital. Will continue IVF and repeat orthostatic BP in the morning.  Dessa Phi, DO [facility] Pager [phone]  If 7PM-7AM, please contact night-coverage [email or web address] Password [email or web address] 11/01/2015, 5:07 PM

## 2015-11-01 NOTE — ED Notes (Signed)
Pt being admitted for non-intractable migraine that has led to decreased po intake and falls. Pt has hx of migraines that she takes depakote daily for. This migraine is described as much worse. Pt is unsteady on her feet when standing. Pt reports at this time after reglan and benadryl, her headache is significantly better at 5/10. Pt is a/o x4. Pt is receiving a bolus of NS, after complete is to have continuous infusion. Pt HR apparently was 110-140, HR is 80 at this time. NSR.

## 2015-11-01 NOTE — ED Notes (Addendum)
Josh PA notified that Pt doesn't feel stable or comfortable enough to ambulate through the dept. Redid orthostatic vitals per PA request and pt felt very dizzy sitting and standing. Pt wobbling while standing and HR went up to 128 PA notified. Stated it made her head hurt worse

## 2015-11-02 DIAGNOSIS — IMO0002 Reserved for concepts with insufficient information to code with codable children: Secondary | ICD-10-CM | POA: Diagnosis present

## 2015-11-02 DIAGNOSIS — F319 Bipolar disorder, unspecified: Secondary | ICD-10-CM | POA: Diagnosis present

## 2015-11-02 DIAGNOSIS — G43709 Chronic migraine without aura, not intractable, without status migrainosus: Secondary | ICD-10-CM | POA: Diagnosis not present

## 2015-11-02 DIAGNOSIS — I951 Orthostatic hypotension: Secondary | ICD-10-CM | POA: Diagnosis not present

## 2015-11-02 DIAGNOSIS — R569 Unspecified convulsions: Secondary | ICD-10-CM | POA: Diagnosis not present

## 2015-11-02 LAB — COMPREHENSIVE METABOLIC PANEL
ALK PHOS: 34 U/L — AB (ref 38–126)
ALT: 11 U/L — AB (ref 14–54)
AST: 11 U/L — ABNORMAL LOW (ref 15–41)
Albumin: 3 g/dL — ABNORMAL LOW (ref 3.5–5.0)
Anion gap: 7 (ref 5–15)
BILIRUBIN TOTAL: 0.2 mg/dL — AB (ref 0.3–1.2)
BUN: 8 mg/dL (ref 6–20)
CALCIUM: 8.6 mg/dL — AB (ref 8.9–10.3)
CO2: 24 mmol/L (ref 22–32)
CREATININE: 0.62 mg/dL (ref 0.44–1.00)
Chloride: 111 mmol/L (ref 101–111)
Glucose, Bld: 98 mg/dL (ref 65–99)
Potassium: 3.8 mmol/L (ref 3.5–5.1)
Sodium: 142 mmol/L (ref 135–145)
Total Protein: 4.9 g/dL — ABNORMAL LOW (ref 6.5–8.1)

## 2015-11-02 LAB — CBC
HEMATOCRIT: 39.8 % (ref 36.0–46.0)
HEMOGLOBIN: 13 g/dL (ref 12.0–15.0)
MCH: 30.6 pg (ref 26.0–34.0)
MCHC: 32.7 g/dL (ref 30.0–36.0)
MCV: 93.6 fL (ref 78.0–100.0)
Platelets: 129 10*3/uL — ABNORMAL LOW (ref 150–400)
RBC: 4.25 MIL/uL (ref 3.87–5.11)
RDW: 12.7 % (ref 11.5–15.5)
WBC: 4.6 10*3/uL (ref 4.0–10.5)

## 2015-11-02 LAB — VALPROIC ACID LEVEL: Valproic Acid Lvl: 24 ug/mL — ABNORMAL LOW (ref 50.0–100.0)

## 2015-11-02 MED ORDER — ONDANSETRON 4 MG PO TBDP: 4.0000 mg | ORAL_TABLET | Freq: Three times a day (TID) | ORAL | 0 refills | Status: DC | PRN

## 2015-11-02 MED ORDER — METOCLOPRAMIDE HCL 5 MG/ML IJ SOLN
10.0000 mg | Freq: Once | INTRAMUSCULAR | Status: AC
  Administered 2015-11-02: 10 mg via INTRAVENOUS
  Filled 2015-11-02: qty 2

## 2015-11-02 MED ORDER — DIVALPROEX SODIUM 500 MG PO DR TAB
500.0000 mg | DELAYED_RELEASE_TABLET | Freq: Two times a day (BID) | ORAL | Status: DC
  Administered 2015-11-02: 500 mg via ORAL
  Filled 2015-11-02: qty 1

## 2015-11-02 MED ORDER — DIPHENHYDRAMINE HCL 50 MG/ML IJ SOLN
25.0000 mg | Freq: Once | INTRAMUSCULAR | Status: AC
  Administered 2015-11-02: 25 mg via INTRAVENOUS
  Filled 2015-11-02: qty 1

## 2015-11-02 MED ORDER — HYDROXYZINE PAMOATE 25 MG PO CAPS
25.0000 mg | ORAL_CAPSULE | Freq: Two times a day (BID) | ORAL | Status: DC | PRN
  Filled 2015-11-02: qty 1

## 2015-11-02 NOTE — Progress Notes (Signed)
Pt d/c to home by car with family. Assessment stable. All questions answered. 
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
## 2015-11-02 NOTE — Discharge Instructions (Signed)
Orthostatic Hypotension Orthostatic hypotension is a sudden drop in blood pressure. It happens when you quickly stand up from a seated or lying position. You may feel dizzy or light-headed. This can last for just a few seconds or for up to a few minutes. It is usually not a serious problem. However, if this happens frequently or gets worse, it can be a sign of something more serious. CAUSES  Different things can cause orthostatic hypotension, including:   Loss of body fluids (dehydration).  Medicines that lower blood pressure.  Sudden changes in posture, such as standing up quickly after you have been sitting or lying down.  Taking too much of your medicine. SIGNS AND SYMPTOMS   Light-headedness or dizziness.   Fainting or near-fainting.   A fast heart rate.   Weakness.   Feeling tired (fatigue).  DIAGNOSIS  Your health care provider may do several things to help diagnose your condition and identify the cause. These may include:   Taking a medical history and doing a physical exam.  Checking your blood pressure. Your health care provider will check your blood pressure when you are:  Lying down.  Sitting.  Standing.  Using tilt table testing. In this test, you lie down on a table that moves from a lying position to a standing position. You will be strapped onto the table. This test monitors your blood pressure and heart rate when you are in different positions. TREATMENT  Treatment will vary depending on the cause. Possible treatments include:   Changing the dosage of your medicines.  Wearing compression stockings on your lower legs.  Standing up slowly after sitting or lying down.  Eating more salt.  Eating frequent, small meals.  In some cases, getting IV fluids.  Taking medicine to enhance fluid retention. HOME CARE INSTRUCTIONS  Only take over-the-counter or prescription medicines as directed by your health care provider.  Follow your health care  provider's instructions for changing the dosage of your current medicines.  Do not stop or adjust your medicine on your own.  Stand up slowly after sitting or lying down. This allows your body to adjust to the different position.  Wear compression stockings as directed.  Eat extra salt as directed.  Do not add extra salt to your diet unless directed to by your health care provider.  Eat frequent, small meals.  Avoid standing suddenly after eating.  Avoid hot showers or excessive heat as directed by your health care provider.  Keep all follow-up appointments. SEEK MEDICAL CARE IF:  You continue to feel dizzy or light-headed after standing.  You feel groggy or confused.  You feel cold, clammy, or sick to your stomach (nauseous).  You have blurred vision.  You feel short of breath. SEEK IMMEDIATE MEDICAL CARE IF:   You faint after standing.  You have chest pain.  You have difficulty breathing.   You lose feeling or movement in your arms or legs.   You have slurred speech or difficulty talking, or you are unable to talk.  MAKE SURE YOU:   Understand these instructions.  Will watch your condition.  Will get help right away if you are not doing well or get worse.   This information is not intended to replace advice given to you by your health care provider. Make sure you discuss any questions you have with your health care provider.   Document Released: 01/23/2002 Document Revised: 02/07/2013 Document Reviewed: 11/25/2012 Elsevier Interactive Patient Education 2016 [employer]. Migraine Headache A  migraine headache is an intense, throbbing pain on one or both sides of your head. A migraine can last for 30 minutes to several hours. CAUSES  The exact cause of a migraine headache is not always known. However, a migraine may be caused when nerves in the brain become irritated and release chemicals that cause inflammation. This causes pain. Certain things may also  trigger migraines, such as:  Alcohol.  Smoking.  Stress.  Menstruation.  Aged cheeses.  Foods or drinks that contain nitrates, glutamate, aspartame, or tyramine.  Lack of sleep.  Chocolate.  Caffeine.  Hunger.  Physical exertion.  Fatigue.  Medicines used to treat chest pain (nitroglycerine), birth control pills, estrogen, and some blood pressure medicines. SIGNS AND SYMPTOMS  Pain on one or both sides of your head.  Pulsating or throbbing pain.  Severe pain that prevents daily activities.  Pain that is aggravated by any physical activity.  Nausea, vomiting, or both.  Dizziness.  Pain with exposure to bright lights, loud noises, or activity.  General sensitivity to bright lights, loud noises, or smells. Before you get a migraine, you may get warning signs that a migraine is coming (aura). An aura may include:  Seeing flashing lights.  Seeing bright spots, halos, or zigzag lines.  Having tunnel vision or blurred vision.  Having feelings of numbness or tingling.  Having trouble talking.  Having muscle weakness. DIAGNOSIS  A migraine headache is often diagnosed based on:  Symptoms.  Physical exam.  A CT scan or MRI of your head. These imaging tests cannot diagnose migraines, but they can help rule out other causes of headaches. TREATMENT Medicines may be given for pain and nausea. Medicines can also be given to help prevent recurrent migraines.  HOME CARE INSTRUCTIONS  Only take over-the-counter or prescription medicines for pain or discomfort as directed by your health care provider. The use of long-term narcotics is not recommended.  Lie down in a dark, quiet room when you have a migraine.  Keep a journal to find out what may trigger your migraine headaches. For example, write down:  What you eat and drink.  How much sleep you get.  Any change to your diet or medicines.  Limit alcohol consumption.  Quit smoking if you smoke.  Get 7-9  hours of sleep, or as recommended by your health care provider.  Limit stress.  Keep lights dim if bright lights bother you and make your migraines worse. SEEK IMMEDIATE MEDICAL CARE IF:   Your migraine becomes severe.  You have a fever.  You have a stiff neck.  You have vision loss.  You have muscular weakness or loss of muscle control.  You start losing your balance or have trouble walking.  You feel faint or pass out.  You have severe symptoms that are different from your first symptoms. MAKE SURE YOU:   Understand these instructions.  Will watch your condition.  Will get help right away if you are not doing well or get worse.   This information is not intended to replace advice given to you by your health care provider. Make sure you discuss any questions you have with your health care provider.   Document Released: 02/02/2005 Document Revised: 02/23/2014 Document Reviewed: 10/10/2012 Elsevier Interactive Patient Education [employer].

## 2015-11-02 NOTE — Progress Notes (Signed)
On-call MD paged. Patient migraine unchanged following medication administration and environmental changes. RN will continue to monitor.

## 2015-11-04 ENCOUNTER — Ambulatory Visit (INDEPENDENT_AMBULATORY_CARE_PROVIDER_SITE_OTHER): Payer: Medicaid Other | Admitting: Neurology

## 2015-11-04 ENCOUNTER — Encounter: Payer: Self-pay | Admitting: Neurology

## 2015-11-04 VITALS — BP 132/92 | HR 80 | Resp 20 | Ht 64.0 in | Wt 161.0 lb

## 2015-11-04 DIAGNOSIS — F0781 Postconcussional syndrome: Secondary | ICD-10-CM

## 2015-11-04 DIAGNOSIS — G43111 Migraine with aura, intractable, with status migrainosus: Secondary | ICD-10-CM

## 2015-11-04 MED ORDER — KETOROLAC TROMETHAMINE 10 MG PO TABS: 10.0000 mg | ORAL_TABLET | Freq: Three times a day (TID) | ORAL | Status: AC | PRN

## 2015-11-04 MED ORDER — VALPROATE SODIUM 500 MG/5ML IV SOLN: 500.0000 mg | INTRAVENOUS | Status: DC

## 2015-11-04 MED ORDER — SODIUM CHLORIDE 0.9 % IV SOLN: 4.0000 mg | Freq: Once | INTRAVENOUS | Status: DC

## 2015-11-04 MED ORDER — PROMETHAZINE HCL 50 MG PO TABS: 50.0000 mg | ORAL_TABLET | Freq: Four times a day (QID) | ORAL | 0 refills | Status: DC | PRN

## 2015-11-04 NOTE — Addendum Note (Signed)
Addended by: Larey Seat on: 11/04/2015 05:21 PM   Modules accepted: Orders

## 2015-11-04 NOTE — Patient Instructions (Signed)
Post-Concussion Syndrome  Post-concussion syndrome describes the symptoms that can occur after a head injury. These symptoms can last from weeks to months.  CAUSES   It is not clear why some head injuries cause post-concussion syndrome. It can occur whether your head injury was mild or severe and whether you were wearing head protection or not.   SIGNS AND SYMPTOMS  · Memory difficulties.  · Dizziness.  · Headaches.  · Double vision or blurry vision.  · Sensitivity to light.  · Hearing difficulties.  · Depression.  · Tiredness.  · Weakness.  · Difficulty with concentration.  · Difficulty sleeping or staying asleep.  · Vomiting.  · Poor balance or instability on your feet.  · Slow reaction time.  · Difficulty learning and remembering things you have heard.  DIAGNOSIS   There is no test to determine whether you have post-concussion syndrome. Your health care provider may order an imaging scan of your brain, such as a CT scan, to check for other problems that may be causing your symptoms (such as a severe injury inside your skull).  TREATMENT   Usually, these problems disappear over time without medical care. Your health care provider may prescribe medicine to help ease your symptoms. It is important to follow up with a neurologist to evaluate your recovery and address any lingering symptoms or issues.  HOME CARE INSTRUCTIONS   · Take medicines only as directed by your health care provider. Do not take aspirin. Aspirin can slow blood clotting.  · Sleep with your head slightly elevated to help with headaches.  · Avoid any situation where there is potential for another head injury. This includes football, hockey, soccer, basketball, martial arts, downhill snow sports, and horseback riding. Your condition will get worse every time you experience a concussion. You should avoid these activities until you are evaluated by the appropriate follow-up health care providers.  · Keep all follow-up visits as directed by your health  care provider. This is important.  SEEK MEDICAL CARE IF:  · You have increased problems paying attention or concentrating.  · You have increased difficulty remembering or learning new information.  · You need more time to complete tasks or assignments than before.  · You have increased irritability or decreased ability to cope with stress.  · You have more symptoms than before.  Seek medical care if you have any of the following symptoms for more than two weeks after your injury:  · Lasting (chronic) headaches.  · Dizziness or balance problems.  · Nausea.  · Vision problems.  · Increased sensitivity to noise or light.  · Depression or mood swings.  · Anxiety or irritability.  · Memory problems.  · Difficulty concentrating or paying attention.  · Sleep problems.  · Feeling tired all the time.  SEEK IMMEDIATE MEDICAL CARE IF:  · You have confusion or unusual drowsiness.  · Others find it difficult to wake you up.  · You have nausea or persistent, forceful vomiting.  · You feel like you are moving when you are not (vertigo). Your eyes may move rapidly back and forth.  · You have convulsions or faint.  · You have severe, persistent headaches that are not relieved by medicine.  · You cannot use your arms or legs normally.  · One of your pupils is larger than the other.  · You have clear or bloody discharge from your nose or ears.  · Your problems are getting worse, not better.  MAKE   SURE YOU:  · Understand these instructions.  · Will watch your condition.  · Will get help right away if you are not doing well or get worse.     This information is not intended to replace advice given to you by your health care provider. Make sure you discuss any questions you have with your health care provider.     Document Released: 07/25/2001 Document Revised: 02/23/2014 Document Reviewed: 05/10/2013  Elsevier Interactive Patient Education ©2016 Elsevier Inc[employer]
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
## 2015-11-06 ENCOUNTER — Telehealth: Payer: Self-pay | Admitting: Neurology

## 2015-11-06 NOTE — Telephone Encounter (Signed)
Pt called in stating she is having migraines since last night. She is vomiting even after taking nausea medication. She is wondering about the nasal spray that was mentioned during last OV.Please call and advise [phone]

## 2015-11-06 NOTE — Telephone Encounter (Signed)
I don't see mention of a nasal spray in your office visit note. Is this something you would like to prescribe?

## 2015-11-07 MED ORDER — BUTORPHANOL TARTRATE 10 MG/ML NA SOLN: 1.0000 | NASAL | 0 refills | Status: DC | PRN

## 2015-11-07 NOTE — Telephone Encounter (Signed)
I spoke to pt and advised her that Dr. Brett Fairy has ordered stadol nasal spray for her migraines. I advised her that this nasal spray is a narcotic. I advised her that she may use the nasal spray 1 spray into the nose every 4 hours as needed for headache. I reviewed the side effects of stadol with the pt. Pt verbalized understanding. Pt asked that I fax the nasal spray to [facility] on [address].

## 2015-11-07 NOTE — Telephone Encounter (Signed)
RX for stadol faxed to [facility]. Received a receipt of confirmation.  

## 2015-12-03 ENCOUNTER — Telehealth: Payer: Self-pay | Admitting: Neurology

## 2015-12-03 MED ORDER — DIVALPROEX SODIUM 500 MG PO DR TAB: 500.0000 mg | DELAYED_RELEASE_TABLET | Freq: Two times a day (BID) | ORAL | 5 refills | Status: DC

## 2015-12-03 MED ORDER — BUTORPHANOL TARTRATE 10 MG/ML NA SOLN: 1.0000 | NASAL | 0 refills | Status: DC | PRN

## 2015-12-03 NOTE — Telephone Encounter (Signed)
Patient requesting refill of divalproex (DEPAKOTE) 500 MG DR tablet , butorphanol (STADOL) 10 MG/ML nasal spray [facility]acy:[id number] [place], [place] - [address]  RX WILL NEED PA.   Last week pt fainted and again yesterday while walking to her mailbox. She thinks she was out for just a min and then woke back up. Pt also says her vision in left eye feels like she has a film on it and her vision is shaking. [phone]

## 2015-12-03 NOTE — Telephone Encounter (Signed)
What should I tell the pt regarding her syncopal episode and visual disturbances?

## 2015-12-03 NOTE — Telephone Encounter (Signed)
Stadol spray was just ordered 11/07/2015. It has not even been ordered for 30 days.  Dr. Brett Fairy ordered a depakote infusion last visit. It does not appear that [facility] has ever prescribed depakote for pt to be taken daily.  Dr. Brett Fairy, what should we do regarding pt's syncopal episode and vision? Also, do you want to prescribe depakote and stadol again?

## 2015-12-03 NOTE — Telephone Encounter (Signed)
She is on depakote 500 mg bid- that's the prevention medication for migraine.  She needs barrier contraception / unless she had hysterectomy or tubal ligation.  I have allowed Stadol to keep her out of the ED.

## 2015-12-04 NOTE — Telephone Encounter (Signed)
Hydration, hydration.   Please have EKG with PCP and orthostatics.

## 2015-12-05 NOTE — Telephone Encounter (Signed)
I spoke to pt. With regards to her syncopal episode and visual disturbances, I advised her that Dr. Brett Fairy recommended hydration and to have her PCP check an EKG with orthostatic vitals. Pt says that she is actually seeing her PCP next week and and will discuss with him. She says that she will also continue to hydrate thoroughly. Pt will contact us with any further questions or concerns.

## 2015-12-05 NOTE — Telephone Encounter (Signed)
I also advised pt that depakote and stadol RX were sent to her pharmacy. I advised pt that she should use barrier contraception, unless she has had a tubal ligation or hysterectomy. Pt verbalized understanding.

## 2015-12-09 ENCOUNTER — Other Ambulatory Visit: Payer: Self-pay | Admitting: Neurology

## 2016-01-17 ENCOUNTER — Ambulatory Visit (HOSPITAL_COMMUNITY)
Admission: EM | Admit: 2016-01-17 | Discharge: 2016-01-17 | Disposition: A | Discharge status: Home / Self Care | Payer: Medicaid Other | Attending: Family Medicine | Admitting: Family Medicine

## 2016-01-17 ENCOUNTER — Encounter (HOSPITAL_COMMUNITY): Payer: Self-pay | Admitting: Emergency Medicine

## 2016-01-17 DIAGNOSIS — M5440 Lumbago with sciatica, unspecified side: Secondary | ICD-10-CM | POA: Diagnosis not present

## 2016-01-17 MED ORDER — METHYLPREDNISOLONE ACETATE 80 MG/ML IJ SUSP
INTRAMUSCULAR | Status: AC
  Filled 2016-01-17: qty 1

## 2016-01-17 MED ORDER — KETOROLAC TROMETHAMINE 30 MG/ML IJ SOLN
30.0000 mg | Freq: Once | INTRAMUSCULAR | Status: AC
  Administered 2016-01-17: 30 mg via INTRAMUSCULAR

## 2016-01-17 MED ORDER — CYCLOBENZAPRINE HCL 5 MG PO TABS: 5.0000 mg | ORAL_TABLET | Freq: Three times a day (TID) | ORAL | 0 refills | Status: DC

## 2016-01-17 MED ORDER — METHYLPREDNISOLONE 4 MG PO TBPK: ORAL_TABLET | ORAL | 0 refills | Status: DC

## 2016-01-17 MED ORDER — METHYLPREDNISOLONE ACETATE 80 MG/ML IJ SUSP
80.0000 mg | Freq: Once | INTRAMUSCULAR | Status: AC
  Administered 2016-01-17: 80 mg via INTRAMUSCULAR

## 2016-01-17 MED ORDER — KETOROLAC TROMETHAMINE 30 MG/ML IJ SOLN
INTRAMUSCULAR | Status: AC
  Filled 2016-01-17: qty 1

## 2016-01-17 NOTE — ED Provider Notes (Signed)
[place]    CSN: [id number] Arrival date & time: 01/17/16  1830     History   Chief Complaint Chief Complaint  Patient presents with  . Back Pain  . Leg Pain    HPI Kristin Braun is a 40 y.o. female.   The history is provided by the patient.  Back Pain  Location:  Lumbar spine Quality:  Burning Radiates to:  L posterior upper leg Pain severity:  Moderate Onset quality:  Gradual Duration:  2 days Progression:  Worsening Chronicity:  New Context: not falling, not jumping from heights and not lifting heavy objects   Worsened by:  Lying down and sitting Ineffective treatments:  None tried Associated symptoms: bladder incontinence, leg pain, numbness, paresthesias and tingling   Associated symptoms: no abdominal pain, no abdominal swelling, no bowel incontinence and no fever   Leg Pain  Associated symptoms: back pain   Associated symptoms: no fever and no neck pain     Past Medical History:  Diagnosis Date  . Anxiety   . Bipolar 1 disorder ([place])   . Epilepsy ([place])   . Fibromyalgia   . Headache   . Hepatitis B   . Hypertension   . Osteoarthritis   . Osteoporosis   . PTSD (post-traumatic stress disorder)   . Seizures ([place])   . TBI (traumatic brain injury) ([place])   . Thyroid disease     Patient Active Problem List   Diagnosis Date Noted  . Bipolar I disorder ([place]) 11/02/2015  . Chronic migraine 11/02/2015  . Pre-syncope 11/01/2015  . Orthostatic hypotension 11/01/2015  . TBI (traumatic brain injury) ([place])   . Seizures ([place])   . Headache     Past Surgical History:  Procedure Laterality Date  . HEAD HARDWARE REMOVAL  Pt has a plate in her head  . TUBAL LIGATION      OB History    No data available       Home Medications    Prior to Admission medications   Medication Sig Start Date End Date Taking? Authorizing Provider  busPIRone (BUSPAR) 5 MG tablet Take 5 mg by mouth 3 (three) times daily.   Yes Historical Provider, MD    butorphanol (STADOL) 10 MG/ML nasal spray Place 1 spray into the nose every 4 (four) hours as needed for headache. 12/03/15  Yes Carmen Dohmeier, MD  divalproex (DEPAKOTE) 500 MG DR tablet Take 1 tablet (500 mg total) by mouth 2 (two) times daily. 12/03/15  Yes Carmen Dohmeier, MD  ethosuximide (ZARONTIN) 250 MG capsule Take 1 capsule (250 mg total) by mouth 2 (two) times daily. 07/09/15  Yes Carmen Dohmeier, MD  lurasidone (LATUDA) 40 MG TABS tablet Take 40 mg by mouth daily with breakfast.   Yes Historical Provider, MD  pantoprazole (PROTONIX) 40 MG tablet Take 40 mg by mouth daily.   Yes Historical Provider, MD  pregabalin (LYRICA) 75 MG capsule Take 75 mg by mouth 2 (two) times daily.   Yes Historical Provider, MD  promethazine (PHENERGAN) 50 MG tablet Take 1 tablet (50 mg total) by mouth every 6 (six) hours as needed for nausea or vomiting (use 1/2 tablet first, if no relief take second half po.). 11/04/15  Yes Larey Seat, MD  traZODone (DESYREL) 50 MG tablet Take 50 mg by mouth at bedtime.  06/27/15  Yes Historical Provider, MD  venlafaxine (EFFEXOR) 37.5 MG tablet Take 37.5 mg by mouth 2 (two) times daily.   Yes Historical Provider, MD  calcium carbonate (TUMS - DOSED IN MG ELEMENTAL CALCIUM) 500 MG chewable tablet Chew 1 tablet by mouth daily.    Historical Provider, MD  diphenhydrAMINE (BENADRYL) 25 mg capsule Take 25 mg by mouth every 6 (six) hours as needed for itching.    Historical Provider, MD  DULoxetine (CYMBALTA) 60 MG capsule Take 60 mg by mouth daily.    Historical Provider, MD  hydrOXYzine (VISTARIL) 25 MG capsule Take 25 mg by mouth 2 (two) times daily as needed for anxiety.  06/06/15   Historical Provider, MD  ondansetron (ZOFRAN ODT) 4 MG disintegrating tablet Take 1 tablet (4 mg total) by mouth every 8 (eight) hours as needed for nausea or vomiting. 11/02/15   Shon Millet, DO    Family History Family History  Problem Relation Age of Onset  . Cancer Mother    . Diabetes Mellitus II Father     Social History Social History  Substance Use Topics  . Smoking status: Never Smoker  . Smokeless tobacco: Never Used  . Alcohol use No     Allergies   Penicillins and Morphine and related   Review of Systems Review of Systems  Constitutional: Negative for fever.  Gastrointestinal: Negative for abdominal pain and bowel incontinence.  Genitourinary: Positive for bladder incontinence.  Musculoskeletal: Positive for back pain. Negative for gait problem, joint swelling, myalgias, neck pain and neck stiffness.  Neurological: Positive for tingling, numbness and paresthesias. Negative for dizziness.  All other systems reviewed and are negative.    Physical Exam Triage Vital Signs ED Triage Vitals [01/17/16 1859]  Enc Vitals Group     BP 136/85     Pulse Rate 85     Resp 20     Temp 98.4 F (36.9 C)     Temp Source Oral     SpO2 99 %     Weight      Height      Head Circumference      Peak Flow      Pain Score 7     Pain Loc      Pain Edu?      Excl. in [place]?    No data found.   Updated Vital Signs BP 136/85 (BP Location: Left Arm)   Pulse 85   Temp 98.4 F (36.9 C) (Oral)   Resp 20   LMP 12/04/2015   SpO2 99%   Visual Acuity Right Eye Distance:   Left Eye Distance:   Bilateral Distance:    Right Eye Near:   Left Eye Near:    Bilateral Near:     Physical Exam  Constitutional: She is oriented to person, place, and time. She appears well-developed and well-nourished. She appears distressed.  Musculoskeletal:       Lumbar back: She exhibits decreased range of motion, tenderness, pain and spasm. She exhibits no swelling, no deformity and normal pulse.  Neurological: She is alert and oriented to person, place, and time.  Skin: Skin is warm and dry.  Nursing note and vitals reviewed.    UC Treatments / Results  Labs (all labs ordered are listed, but only abnormal results are displayed) Labs Reviewed - No data to  display  EKG  EKG Interpretation None       Radiology No results found.  Procedures Procedures (including critical care time)  Medications Ordered in UC Medications  ketorolac (TORADOL) 30 MG/ML injection 30 mg (not administered)  methylPREDNISolone acetate (DEPO-MEDROL) injection 80 mg (not administered)  Initial Impression / Assessment and Plan / UC Course  I have reviewed the triage vital signs and the nursing notes.  Pertinent labs & imaging results that were available during my care of the patient were reviewed by me and considered in my medical decision making (see chart for details).  Clinical Course       Final Clinical Impressions(s) / UC Diagnoses   Final diagnoses:  None    New Prescriptions New Prescriptions   No medications on file     Billy Fischer, MD 01/17/16 1939

## 2016-01-17 NOTE — ED Triage Notes (Signed)
Here for lower back pan and LLE pain onset x3 days  Reports pain increases when sitting or laying flat  Reports never down left leg  Saw chiropractor on Wednesday  Denies inj/trauma  Hx of spinal stenosis, neuropathy and seizures

## 2016-01-20 ENCOUNTER — Emergency Department (HOSPITAL_COMMUNITY): Payer: Medicaid Other

## 2016-01-20 ENCOUNTER — Encounter (HOSPITAL_COMMUNITY): Payer: Self-pay

## 2016-01-20 ENCOUNTER — Emergency Department (HOSPITAL_COMMUNITY)
Admission: EM | Admit: 2016-01-20 | Discharge: 2016-01-21 | Disposition: A | Discharge status: Home / Self Care | Payer: Medicaid Other | Attending: Emergency Medicine | Admitting: Emergency Medicine

## 2016-01-20 DIAGNOSIS — Y999 Unspecified external cause status: Secondary | ICD-10-CM | POA: Insufficient documentation

## 2016-01-20 DIAGNOSIS — Y9389 Activity, other specified: Secondary | ICD-10-CM | POA: Insufficient documentation

## 2016-01-20 DIAGNOSIS — S9032XA Contusion of left foot, initial encounter: Secondary | ICD-10-CM | POA: Diagnosis not present

## 2016-01-20 DIAGNOSIS — S7002XA Contusion of left hip, initial encounter: Secondary | ICD-10-CM | POA: Diagnosis not present

## 2016-01-20 DIAGNOSIS — R51 Headache: Secondary | ICD-10-CM | POA: Diagnosis not present

## 2016-01-20 DIAGNOSIS — R32 Unspecified urinary incontinence: Secondary | ICD-10-CM | POA: Diagnosis not present

## 2016-01-20 DIAGNOSIS — S7001XA Contusion of right hip, initial encounter: Secondary | ICD-10-CM

## 2016-01-20 DIAGNOSIS — S40012A Contusion of left shoulder, initial encounter: Secondary | ICD-10-CM | POA: Diagnosis not present

## 2016-01-20 DIAGNOSIS — W109XXA Fall (on) (from) unspecified stairs and steps, initial encounter: Secondary | ICD-10-CM | POA: Insufficient documentation

## 2016-01-20 DIAGNOSIS — I10 Essential (primary) hypertension: Secondary | ICD-10-CM | POA: Diagnosis not present

## 2016-01-20 DIAGNOSIS — W19XXXA Unspecified fall, initial encounter: Secondary | ICD-10-CM

## 2016-01-20 DIAGNOSIS — S79911A Unspecified injury of right hip, initial encounter: Secondary | ICD-10-CM | POA: Diagnosis present

## 2016-01-20 DIAGNOSIS — Z79899 Other long term (current) drug therapy: Secondary | ICD-10-CM | POA: Insufficient documentation

## 2016-01-20 DIAGNOSIS — Y92009 Unspecified place in unspecified non-institutional (private) residence as the place of occurrence of the external cause: Secondary | ICD-10-CM | POA: Diagnosis not present

## 2016-01-20 LAB — I-STAT BETA HCG BLOOD, ED (MC, WL, AP ONLY)

## 2016-01-20 NOTE — ED Notes (Signed)
Patient transported to X-ray 

## 2016-01-20 NOTE — ED Provider Notes (Signed)
[facility] DEPT Provider Note   CSN: [id number] Arrival date & time: 01/20/16  [id number]     History   Chief Complaint Chief Complaint  Patient presents with  . Fall    HPI Kristin Braun is a 40 y.o. female.  She fell off of a porch with unknown loss of consciousness. She does remember being on the porch and falling, and the next thing she remembers, she was in the bushes. She is complaining of pain in her right hip and rates pain at 8/10. She thinks that she just lost her balance when she turned around on the porch.   The history is provided by the patient.  Fall     Past Medical History:  Diagnosis Date  . Anxiety   . Bipolar 1 disorder ([place])   . Epilepsy ([place])   . Fibromyalgia   . Headache   . Hepatitis B   . Hypertension   . Osteoarthritis   . Osteoporosis   . PTSD (post-traumatic stress disorder)   . Seizures ([place])   . TBI (traumatic brain injury) ([place])   . Thyroid disease     Patient Active Problem List   Diagnosis Date Noted  . Bipolar I disorder ([place]) 11/02/2015  . Chronic migraine 11/02/2015  . Pre-syncope 11/01/2015  . Orthostatic hypotension 11/01/2015  . TBI (traumatic brain injury) ([place])   . Seizures ([place])   . Headache     Past Surgical History:  Procedure Laterality Date  . HEAD HARDWARE REMOVAL  Pt has a plate in her head  . TUBAL LIGATION      OB History    No data available       Home Medications    Prior to Admission medications   Medication Sig Start Date End Date Taking? Authorizing Provider  busPIRone (BUSPAR) 5 MG tablet Take 5 mg by mouth 3 (three) times daily.    Historical Provider, MD  butorphanol (STADOL) 10 MG/ML nasal spray Place 1 spray into the nose every 4 (four) hours as needed for headache. 12/03/15   Asencion Partridge Dohmeier, MD  calcium carbonate (TUMS - DOSED IN MG ELEMENTAL CALCIUM) 500 MG chewable tablet Chew 1 tablet by mouth daily.    Historical Provider, MD  cyclobenzaprine (FLEXERIL) 5 MG tablet Take 1 tablet  (5 mg total) by mouth 3 (three) times daily. 01/17/16   Billy Fischer, MD  diphenhydrAMINE (BENADRYL) 25 mg capsule Take 25 mg by mouth every 6 (six) hours as needed for itching.    Historical Provider, MD  divalproex (DEPAKOTE) 500 MG DR tablet Take 1 tablet (500 mg total) by mouth 2 (two) times daily. 12/03/15   Asencion Partridge Dohmeier, MD  DULoxetine (CYMBALTA) 60 MG capsule Take 60 mg by mouth daily.    Historical Provider, MD  ethosuximide (ZARONTIN) 250 MG capsule Take 1 capsule (250 mg total) by mouth 2 (two) times daily. 07/09/15   Asencion Partridge Dohmeier, MD  hydrOXYzine (VISTARIL) 25 MG capsule Take 25 mg by mouth 2 (two) times daily as needed for anxiety.  06/06/15   Historical Provider, MD  lurasidone (LATUDA) 40 MG TABS tablet Take 40 mg by mouth daily with breakfast.    Historical Provider, MD  methylPREDNISolone (MEDROL DOSEPAK) 4 MG TBPK tablet follow package directions start on sat, take until finished 01/17/16   Billy Fischer, MD  ondansetron (ZOFRAN ODT) 4 MG disintegrating tablet Take 1 tablet (4 mg total) by mouth every 8 (eight) hours as needed for nausea or vomiting. 11/02/15  Jennifer Chahn-Yang Choi, DO  pantoprazole (PROTONIX) 40 MG tablet Take 40 mg by mouth daily.    Historical Provider, MD  pregabalin (LYRICA) 75 MG capsule Take 75 mg by mouth 2 (two) times daily.    Historical Provider, MD  promethazine (PHENERGAN) 50 MG tablet Take 1 tablet (50 mg total) by mouth every 6 (six) hours as needed for nausea or vomiting (use 1/2 tablet first, if no relief take second half po.). 11/04/15   Larey Seat, MD  traZODone (DESYREL) 50 MG tablet Take 50 mg by mouth at bedtime.  06/27/15   Historical Provider, MD  venlafaxine (EFFEXOR) 37.5 MG tablet Take 37.5 mg by mouth 2 (two) times daily.    Historical Provider, MD    Family History Family History  Problem Relation Age of Onset  . Cancer Mother   . Diabetes Mellitus II Father     Social History Social History  Substance Use Topics  .  Smoking status: Never Smoker  . Smokeless tobacco: Never Used  . Alcohol use No     Allergies   Penicillins and Morphine and related   Review of Systems Review of Systems  All other systems reviewed and are negative.    Physical Exam Updated Vital Signs BP 122/88   Pulse 74   Temp 98.4 F (36.9 C) (Oral)   Resp 18   Ht 5\' 4"  (1.626 m)   Wt 174 lb (78.9 kg)   LMP 01/19/2016   SpO2 98%   BMI 29.87 kg/m   Physical Exam  Nursing note and vitals reviewed.  40 year old female, resting comfortably and in no acute distress. She is on a long spine board with stiff cervical collar in place. Vital signs are normal. Oxygen saturation is 98%, which is normal. Head is normocephalic and atraumatic. PERRLA, EOMI. Oropharynx is clear. Neck is mildly tender in the midline. There is no adenopathy or JVD. Back is nontender and there is no CVA tenderness. Lungs are clear without rales, wheezes, or rhonchi. Chest is nontender. Heart has regular rate and rhythm without murmur. Abdomen is soft, flat, nontender without masses or hepatosplenomegaly and peristalsis is normoactive. Pelvis is stable, but with tenderness across the anterior pelvic brim. Extremities have no cyanosis or edema, full range of motion is present. Minor abrasions are seen to the posterior aspect of the left shoulder and there is pain on passive range of motion. There is tenderness to palpation of both hips and pain with range of motion of both hips. Full range of motion of all other joints without pain. Skin is warm and dry without rash. Neurologic: Mental status is normal, cranial nerves are intact, there are no motor or sensory deficits.  ED Treatments / Results  Labs (all labs ordered are listed, but only abnormal results are displayed) Labs Reviewed  I-STAT BETA HCG BLOOD, ED ([facility], [facility], [facility] ONLY)    EKG  EKG Interpretation  Date/Time:  Monday January 20 2016 18:55:53 EST Ventricular Rate:  77 PR Interval:      QRS Duration: 86 QT Interval:  375 QTC Calculation: 425 R Axis:   33 Text Interpretation:  Sinus rhythm Abnormal R-wave progression, early transition When compared with ECG of 11/01/2015, Nonspecific T wave abnormality is no longer Present Confirmed by [facility]  MD, Kwynn Schlotter ([id number]) on 01/20/2016 7:00:04 PM       Radiology Ct Head Wo Contrast  Result Date: 01/20/2016 CLINICAL DATA:  Unwitnessed fall off steps today at home. EXAM: CT HEAD WITHOUT  CONTRAST CT CERVICAL SPINE WITHOUT CONTRAST TECHNIQUE: Multidetector CT imaging of the head and cervical spine was performed following the standard protocol without intravenous contrast. Multiplanar CT image reconstructions of the cervical spine were also generated. COMPARISON:  Head CT 10/31/2015 FINDINGS: CT HEAD FINDINGS Brain: No acute intracranial hemorrhage. Left frontal encephalomalacia is unchanged from prior exam. No subdural or or extra-axial fluid collection. No hydrocephalus, CT findings of acute ischemia, mass effect or midline shift. Vascular: No hyperdense vessel. Skull: No calvarial fracture. Sequela of prior craniotomy in the left temporal bone. Sinuses/Orbits: Chronic defect in the left orbital roof. No acute orbital finding. Other: No change from prior exam. CT CERVICAL SPINE FINDINGS Alignment: Mild straightening of mid cervical spine. No listhesis or traumatic subluxation. Skull base and vertebrae: No acute fracture. No primary bone lesion or focal pathologic process. Soft tissues and spinal canal: No prevertebral fluid or swelling. No visible canal hematoma. Disc levels: Mild disc space narrowing and endplate spurring at D34-534. Lesser disc space narrowing and endplate spurring at D34-534 and C6-C7. Upper chest: No acute abnormality. Other: None. IMPRESSION: 1. No acute intracranial abnormality. Stable encephalomalacia in the left frontal lobe. 2. No acute fracture or subluxation of the cervical spine. Mild degenerative disc disease is most prominent  at C5-C6. Electronically Signed   By: Jeb Levering M.D.   On: 01/20/2016 21:39   Ct Cervical Spine Wo Contrast  Result Date: 01/20/2016 CLINICAL DATA:  Unwitnessed fall off steps today at home. EXAM: CT HEAD WITHOUT CONTRAST CT CERVICAL SPINE WITHOUT CONTRAST TECHNIQUE: Multidetector CT imaging of the head and cervical spine was performed following the standard protocol without intravenous contrast. Multiplanar CT image reconstructions of the cervical spine were also generated. COMPARISON:  Head CT 10/31/2015 FINDINGS: CT HEAD FINDINGS Brain: No acute intracranial hemorrhage. Left frontal encephalomalacia is unchanged from prior exam. No subdural or or extra-axial fluid collection. No hydrocephalus, CT findings of acute ischemia, mass effect or midline shift. Vascular: No hyperdense vessel. Skull: No calvarial fracture. Sequela of prior craniotomy in the left temporal bone. Sinuses/Orbits: Chronic defect in the left orbital roof. No acute orbital finding. Other: No change from prior exam. CT CERVICAL SPINE FINDINGS Alignment: Mild straightening of mid cervical spine. No listhesis or traumatic subluxation. Skull base and vertebrae: No acute fracture. No primary bone lesion or focal pathologic process. Soft tissues and spinal canal: No prevertebral fluid or swelling. No visible canal hematoma. Disc levels: Mild disc space narrowing and endplate spurring at D34-534. Lesser disc space narrowing and endplate spurring at D34-534 and C6-C7. Upper chest: No acute abnormality. Other: None. IMPRESSION: 1. No acute intracranial abnormality. Stable encephalomalacia in the left frontal lobe. 2. No acute fracture or subluxation of the cervical spine. Mild degenerative disc disease is most prominent at C5-C6. Electronically Signed   By: Jeb Levering M.D.   On: 01/20/2016 21:39   Dg Shoulder Left  Result Date: 01/20/2016 CLINICAL DATA:  Left shoulder pain due to a fall off a porch when a railing gave way today. Initial  encounter. EXAM: LEFT SHOULDER - 2+ VIEW COMPARISON:  None. FINDINGS: There is no evidence of fracture or dislocation. There is no evidence of arthropathy or other focal bone abnormality. Soft tissues are unremarkable. IMPRESSION: Negative exam. Electronically Signed   By: Inge Rise M.D.   On: 01/20/2016 21:12   Dg Hips Bilat W Or Wo Pelvis 5 Views  Result Date: 01/20/2016 CLINICAL DATA:  [place] off her porch with a railing gave way, 3 or  4 stairs. EXAM: DG HIP (WITH OR WITHOUT PELVIS) 5+V BILAT COMPARISON:  None. FINDINGS: There is no evidence of hip fracture or dislocation. There is no evidence of arthropathy or other focal bone abnormality. IMPRESSION: Negative. Electronically Signed   By: Andreas Newport M.D.   On: 01/20/2016 21:12    Procedures Procedures (including critical care time)  Medications Ordered in ED Medications - No data to display   Initial Impression / Assessment and Plan / ED Course  I have reviewed the triage vital signs and the nursing notes.  Pertinent labs & imaging results that were available during my care of the patient were reviewed by me and considered in my medical decision making (see chart for details).  Clinical Course    Fall with questionable loss of consciousness. Exam does show tenderness across the pelvis and minor abrasions to the posterior aspect of the left shoulder. She'll be sent for CT of head and cervical spine as well as plain x-rays of her hips and left shoulder. Old records are reviewed, and she does have prior evaluation for syncope.  X-rays show no acute injury. Patient tells me that she is having urinary incontinence and not aware of it. Her logic exam is repeated and there are no obvious motor deficits although she demonstrates poor effort. There are inconsistent sensory findings. She will be sent for MRI of the lumbar spine to rule out cauda equina syndrome.   Her mother has arrived and apparently she has been having problems with  urinary incontinence for several months and this has been discussed with her neurologist. If MRI is negative, will refer to urology for additional workup. Case is signed out to Dr. Leonides Schanz.  Final Clinical Impressions(s) / ED Diagnoses   Final diagnoses:  Fall, initial encounter  Contusion of right hip, initial encounter  Contusion of left hip, initial encounter  Contusion of left shoulder, initial encounter  Urinary incontinence, unspecified type    New Prescriptions New Prescriptions   No medications on file     Delora Fuel, MD [id number]

## 2016-01-20 NOTE — ED Notes (Signed)
Patient transported to MRI 

## 2016-01-20 NOTE — ED Triage Notes (Signed)
Pt BIB [facility] for a fall down 3-4 steps at home. Fall was not witnessed, pt was found by family. Pt does not remember fall. Reports dizzy spells but denies dizziness currently.

## 2016-01-20 NOTE — ED Notes (Signed)
XR notified that pt is ready for transport.

## 2016-01-20 NOTE — ED Provider Notes (Signed)
12:00 AM  Assumed care from Dr. Roxanne Mins.  Briefly, pt is a 40 y.o. female who presented to the emergency department after a fall. CT of her head, cervical spine x-ray of her left shoulder and pelvis show no acute abnormality. States she felt dizzy prior to falling. Has a history of orthostatic hypotension. Complaining of lower back pain, urinary incontinence. MRI of her lumbar spine pending. Family arrived and report that the urinary incontinence is something that has been on quite for several months.    EKG Interpretation  Date/Time:  Tuesday January 21 2016 01:17:05 EST Ventricular Rate:  71 PR Interval:    QRS Duration: 86 QT Interval:  412 QTC Calculation: 448 R Axis:   41 Text Interpretation:  Sinus rhythm No significant change since last tracing Confirmed by WARD,  DO, KRISTEN [id number]) on 01/21/2016 1:24:47 AM      3:00 AM  Pt's Labs are unremarkable. EKG repeated with no abnormality. Urine shows blood but no other sign of infection. Just finished her menstrual cycle. She is not pregnant. I have gone back to reassess patient. She tells me that she has had over a year of cervical and thoracic pain and numbness in both of her hands and down both of her legs. Has also had a urine half of urinary incontinence. No bowel incontinence. No fever. Tonight she states she fell down several steps. Is not sure what made her fall. Does have history of orthostatic hypotension but denies any chest pain, shortness of breath, palpitations or dizziness that led to her fall. States she did recently do a "digestive cleanse" several days ago and had a large watery bowel movement and several episodes of vomiting but has since then been eating and drinking well. On my reexamination she has no midline spinal tenderness. She reports decreased sensation throughout both of her extremities which is old and no new numbness. No clonus, normal deep tendon reflexes bilaterally, no focal weakness. She is tender to palpation over  the left foot and ankle and has difficulty moving this foot because of pain. She does not have a foot drop on exam and is able to dorsiflex and plantarflex when she is encouraged to do so. Will obtain x-rays of this foot and ankle prior to attempting to have her walk in the ED. She already has an appointment with her neurologist next week, an appointment with orthopedic physician in the morning and has a PCP. Have discussed with patient and her family that she may need MRIs of her cervical and thoracic spine given her symptoms of back pain, numbness, incontinence but do not feel she needs them emergently.  Was seen at urgent care 3 days ago for the same. Is currently on a steroid taper which I have advised her to continue. She was previously being seen by a chiropractor which I have advised her to stop at this time.   4:00 AM  Pt's xrays show no fracture or dislocation.  Patient has been able to ambulate without assistance but complains of pain.  Offered crutches but she declines.  Will discharge with percocet.  Encourage outpatient follow up.  Patient able to urinate on her own.  No retention.  Discussed return precautions with patient and her parents at bedside.  They are comfortable with this plan.   At this time, I do not feel there is any life-threatening condition present. I have reviewed and discussed all results (EKG, imaging, lab, urine as appropriate) and exam findings with patient/family. I have  reviewed nursing notes and appropriate previous records.  I feel the patient is safe to be discharged home without further emergent workup and can continue workup as an outpatient as needed. Discussed usual and customary return precautions. Patient/family verbalize understanding and are comfortable with this plan.  Outpatient follow-up has been provided. All questions have been answered.    [place], DO 01/21/16 0400

## 2016-01-21 ENCOUNTER — Emergency Department (HOSPITAL_COMMUNITY): Payer: Medicaid Other

## 2016-01-21 ENCOUNTER — Ambulatory Visit (INDEPENDENT_AMBULATORY_CARE_PROVIDER_SITE_OTHER): Payer: Medicaid Other | Admitting: Orthopaedic Surgery

## 2016-01-21 ENCOUNTER — Telehealth: Payer: Self-pay | Admitting: Neurology

## 2016-01-21 LAB — BASIC METABOLIC PANEL
ANION GAP: 8 (ref 5–15)
BUN: 10 mg/dL (ref 6–20)
CALCIUM: 9.3 mg/dL (ref 8.9–10.3)
CO2: 24 mmol/L (ref 22–32)
Chloride: 106 mmol/L (ref 101–111)
Creatinine, Ser: 0.81 mg/dL (ref 0.44–1.00)
GFR calc non Af Amer: >60 mL/min (ref >60)
Glucose, Bld: 97 mg/dL (ref 65–99)
POTASSIUM: 4.1 mmol/L (ref 3.5–5.1)
Sodium: 138 mmol/L (ref 135–145)

## 2016-01-21 LAB — URINALYSIS, ROUTINE W REFLEX MICROSCOPIC
Bilirubin Urine: NEGATIVE
Glucose, UA: NEGATIVE mg/dL
KETONES UR: NEGATIVE mg/dL
LEUKOCYTES UA: NEGATIVE
NITRITE: NEGATIVE
PH: 7.5 (ref 5.0–8.0)
PROTEIN: NEGATIVE mg/dL
Specific Gravity, Urine: 1.012 (ref 1.005–1.030)

## 2016-01-21 LAB — CBC WITH DIFFERENTIAL/PLATELET
BASOS ABS: 0 10*3/uL (ref 0.0–0.1)
BASOS PCT: 0 %
Eosinophils Absolute: 0 10*3/uL (ref 0.0–0.7)
Eosinophils Relative: 0 %
HEMATOCRIT: 43.6 % (ref 36.0–46.0)
HEMOGLOBIN: 14.7 g/dL (ref 12.0–15.0)
LYMPHS PCT: 19 %
Lymphs Abs: 2 10*3/uL (ref 0.7–4.0)
MCH: 31.4 pg (ref 26.0–34.0)
MCHC: 33.7 g/dL (ref 30.0–36.0)
MCV: 93.2 fL (ref 78.0–100.0)
Monocytes Absolute: 0.2 10*3/uL (ref 0.1–1.0)
Monocytes Relative: 2 %
NEUTROS ABS: 8.4 10*3/uL — AB (ref 1.7–7.7)
NEUTROS PCT: 79 %
Platelets: 188 10*3/uL (ref 150–400)
RBC: 4.68 MIL/uL (ref 3.87–5.11)
RDW: 12.9 % (ref 11.5–15.5)
WBC: 10.6 10*3/uL — AB (ref 4.0–10.5)

## 2016-01-21 LAB — URINE MICROSCOPIC-ADD ON

## 2016-01-21 MED ORDER — HYDROMORPHONE HCL 2 MG/ML IJ SOLN
1.0000 mg | Freq: Once | INTRAMUSCULAR | Status: AC
  Administered 2016-01-21: 1 mg via INTRAVENOUS
  Filled 2016-01-21: qty 1

## 2016-01-21 MED ORDER — OXYCODONE-ACETAMINOPHEN 5-325 MG PO TABS
2.0000 | ORAL_TABLET | Freq: Once | ORAL | Status: AC
  Administered 2016-01-21: 2 via ORAL
  Filled 2016-01-21: qty 2

## 2016-01-21 MED ORDER — IBUPROFEN 800 MG PO TABS: 800.0000 mg | ORAL_TABLET | Freq: Three times a day (TID) | ORAL | 0 refills | Status: DC | PRN

## 2016-01-21 MED ORDER — OXYCODONE-ACETAMINOPHEN 5-325 MG PO TABS: 1.0000 | ORAL_TABLET | Freq: Four times a day (QID) | ORAL | 0 refills | Status: DC | PRN

## 2016-01-21 NOTE — ED Notes (Signed)
Pt states she "still feels the bedpan under me" after it has been removed several minutes ago.  Reports n/t goes from low back up to approx waist area and down both legs with right leg just to knee, left leg all the way to foot posteriorally.

## 2016-01-21 NOTE — ED Notes (Signed)
Ambulated with patient. Pt able to bear weight on L foot, walked about 15 ft. but complaining of 8/10 pain.

## 2016-01-21 NOTE — Telephone Encounter (Signed)
Patient is calling stating her insurance will not pay for butorphanol (STADOL) 10 MG/ML nasal spray. Is there another medication to replace this? Please call to [facility] on [address]. Please call patient and advise.

## 2016-01-21 NOTE — ED Notes (Signed)
Pt returned from xray.  Denies relief from dilaudid.  Report to [employer]

## 2016-01-21 NOTE — ED Notes (Signed)
Patient transported to X-ray 

## 2016-01-28 MED ORDER — BUTALBITAL-APAP-CAFFEINE 50-325-40 MG PO TABS: 1.0000 | ORAL_TABLET | Freq: Four times a day (QID) | ORAL | 3 refills | Status: DC | PRN

## 2016-01-28 NOTE — Telephone Encounter (Signed)
Any alternatives that you recommend?

## 2016-01-28 NOTE — Telephone Encounter (Signed)
LM for patient that Dr. Brett Fairy has sent in a new Rx to replace the one insurance will not cover anymore (no details given). I advised which pharmacy it was sent to. Left call back number for further questions.

## 2016-01-28 NOTE — Telephone Encounter (Signed)
New Rx faxed in, Stadol d/c'd

## 2016-01-28 NOTE — Telephone Encounter (Signed)
Stadol costs about 30 dollars at the pharmacy.   alternative butal butal. Fioricet. PO

## 2016-01-28 NOTE — Addendum Note (Signed)
Addended by: Laurence Spates on: 01/28/2016 04:13 PM   Modules accepted: Orders

## 2016-01-28 NOTE — Addendum Note (Signed)
Addended by: Larey Seat on: 01/28/2016 04:04 PM   Modules accepted: Orders

## 2016-01-29 ENCOUNTER — Ambulatory Visit (INDEPENDENT_AMBULATORY_CARE_PROVIDER_SITE_OTHER): Payer: Medicaid Other | Admitting: Orthopaedic Surgery

## 2016-02-04 ENCOUNTER — Telehealth: Payer: Self-pay

## 2016-02-04 ENCOUNTER — Ambulatory Visit (INDEPENDENT_AMBULATORY_CARE_PROVIDER_SITE_OTHER): Payer: Medicaid Other | Admitting: Adult Health

## 2016-02-04 ENCOUNTER — Encounter: Payer: Self-pay | Admitting: Adult Health

## 2016-02-04 VITALS — BP 112/79 | HR 77 | Resp 16 | Ht 64.0 in | Wt 178.0 lb

## 2016-02-04 DIAGNOSIS — G43019 Migraine without aura, intractable, without status migrainosus: Secondary | ICD-10-CM | POA: Diagnosis not present

## 2016-02-04 DIAGNOSIS — R569 Unspecified convulsions: Secondary | ICD-10-CM | POA: Diagnosis not present

## 2016-02-04 DIAGNOSIS — Z5181 Encounter for therapeutic drug level monitoring: Secondary | ICD-10-CM
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
## 2016-02-04 NOTE — Progress Notes (Signed)
I agree with the assessment and plan as directed by NP  The patient can follow in 6 month. .The patient is known to me .   Katisha Shimizu, MD

## 2016-02-04 NOTE — Patient Instructions (Addendum)
Continue Depakote and Zarontin Blood work today 3 day EEG If your symptoms worsen or you develop new symptoms please let us know.

## 2016-02-04 NOTE — Telephone Encounter (Signed)
Referral fax to [facility] for 3 day EEG.

## 2016-02-06 ENCOUNTER — Telehealth: Payer: Self-pay | Admitting: Adult Health

## 2016-02-06 LAB — VALPROIC ACID LEVEL: Valproic Acid Lvl: 44 ug/mL — ABNORMAL LOW (ref 50–100)

## 2016-02-06 LAB — ETHOSUXIMIDE LEVEL: ETHOSUXIMIDE LVL: NOT DETECTED ug/mL (ref 40–100)

## 2016-02-06 NOTE — Telephone Encounter (Signed)
I called the patient. Her Zarontin level was essentially 0. She states that she ran out of her prescription several days ago. She picked up her prescription yesterday. She plans to restart this today.

## 2016-02-07 ENCOUNTER — Ambulatory Visit
Admission: RE | Admit: 2016-02-07 | Discharge: 2016-02-07 | Disposition: A | Discharge status: Home / Self Care | Payer: Medicaid Other | Source: Ambulatory Visit | Attending: Physician Assistant | Admitting: Physician Assistant

## 2016-02-07 DIAGNOSIS — Z1231 Encounter for screening mammogram for malignant neoplasm of breast: Secondary | ICD-10-CM

## 2016-02-13 ENCOUNTER — Telehealth: Payer: Self-pay | Admitting: *Deleted

## 2016-02-13 NOTE — Telephone Encounter (Signed)
Received fax from [name] that pt is scheduled for set up date 02-20-16 and study disconnection date 02-23-16.  Ambulatory 72hour EEG

## 2016-03-02 ENCOUNTER — Telehealth: Payer: Self-pay | Admitting: *Deleted

## 2016-03-02 ENCOUNTER — Other Ambulatory Visit: Payer: Self-pay | Admitting: Orthopedic Surgery

## 2016-03-02 DIAGNOSIS — M542 Cervicalgia: Secondary | ICD-10-CM

## 2016-03-02 NOTE — Telephone Encounter (Signed)
Received report to 3 day EEG via fax.  Place in inbox.

## 2016-03-10 ENCOUNTER — Other Ambulatory Visit: Payer: Medicaid Other

## 2016-03-10 NOTE — Telephone Encounter (Signed)
I called the patient. Left VM for her to call the office.

## 2016-03-11 NOTE — Telephone Encounter (Signed)
Pt returned NP's call. °
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
## 2016-03-17 ENCOUNTER — Other Ambulatory Visit: Payer: Self-pay | Admitting: *Deleted

## 2016-03-17 DIAGNOSIS — R569 Unspecified convulsions: Secondary | ICD-10-CM

## 2016-03-17 NOTE — Telephone Encounter (Signed)
Per Jinny Blossom, have patient come in for repeat labs for ethosuximide and VPA levels (orders placed in Epic).  She will be called with results.  She was instructed to keep her pending appt with Dr. Brett Fairy on 04/08/16.  She is agreeable to this plan.

## 2016-03-17 NOTE — Telephone Encounter (Signed)
Patient called stating she continues to have staring off moments, losing track of time, having tremors, patient states she drifts of looking drowsy and confused per mother, also having flickering in her vision.  Please call

## 2016-03-19 ENCOUNTER — Other Ambulatory Visit (INDEPENDENT_AMBULATORY_CARE_PROVIDER_SITE_OTHER): Payer: Self-pay

## 2016-03-19 ENCOUNTER — Ambulatory Visit (INDEPENDENT_AMBULATORY_CARE_PROVIDER_SITE_OTHER): Payer: Medicaid Other | Admitting: Nurse Practitioner

## 2016-03-19 ENCOUNTER — Encounter: Payer: Self-pay | Admitting: Nurse Practitioner

## 2016-03-19 ENCOUNTER — Telehealth: Payer: Self-pay | Admitting: Neurology

## 2016-03-19 VITALS — BP 120/81 | HR 69 | Ht 64.0 in | Wt 179.6 lb

## 2016-03-19 DIAGNOSIS — G43709 Chronic migraine without aura, not intractable, without status migrainosus: Secondary | ICD-10-CM

## 2016-03-19 DIAGNOSIS — R569 Unspecified convulsions: Secondary | ICD-10-CM | POA: Diagnosis not present

## 2016-03-19 DIAGNOSIS — Z0289 Encounter for other administrative examinations: Secondary | ICD-10-CM

## 2016-03-19 DIAGNOSIS — IMO0002 Reserved for concepts with insufficient information to code with codable children: Secondary | ICD-10-CM

## 2016-03-19 MED ORDER — DIVALPROEX SODIUM ER 250 MG PO TB24: 750.0000 mg | ORAL_TABLET | Freq: Two times a day (BID) | ORAL | 3 refills | Status: DC

## 2016-03-19 NOTE — Progress Notes (Signed)
[facility]  PATIENT: TEAIRRA NEVEU DOB: 1975-06-13   REASON FOR VISIT: follow up for history of headaches and questionable seizure activity today HISTORY FROM: Patient and Dad    HISTORY OF PRESENT ILLNESS: [name], 41 year old female returns for follow-up on an urgent basis. Patient was here for labs and related that she had a seizure last night. She said she awoke with a migraine around 4 AM and then her hands were shaky a little later she walked up to her parent's house her dad says she was staring off into space the whole episode lasted about 15 minutes when she was aware was trying to talk she did not make sense just mumbling. She said she thought she was having a panic attack as well. She denies missing doses of her Depakote or Zarontin however she has been noncompliant in the past according to her records. In terms of her headaches her headaches wax and wane however she has a few days without a headache she claims. . She returns for reevaluation     12/19/2017MM [name] is a 41 year old female with a history of possible seizure events and migraine headaches. She returns today for an evaluation. She states that she continues to have seizures daily. She states that her family tells her that she has a blank stare several times throughout the day. She also has times where she appears that she is chewing on something however the patient does not realize she is doing this. She also noticed that throughout the day she will have muscle jerks typically when she is very relaxed. She states that her migraine headaches have remained the same even on Depakote. She has approximately 3-4 headaches a week. Headaches typically occur on the left side in the temporal region sometimes starting in the neck. She does have photophobia. Occasionally has nausea and vomiting. Her headaches can last an entire day. She was given a prescription of Stadol however insurance would not cover  this. She is now using Fioricet. Reports that it does "knock the edge off" but does not resolve her headaches. She states that she is on Depakote for bipolar disorder as well. She's also been treated for fibromyalgia by her primary care provider. She does have a psychiatrist that she sees regularly. In the past she's had an EEG that was unremarkable. She had a CT on 01/20/2016 after a fall. The CT was relatively unremarkable. She returns today for an evaluation.  REVIEW OF SYSTEMS: Full 14 system review of systems performed and notable only for those listed, all others are neg:  Constitutional: neg  Cardiovascular: neg Ear/Nose/Throat: neg  Skin: neg Eyes: neg Respiratory: neg Gastroitestinal: neg  Hematology/Lymphatic: neg  Endocrine: neg Musculoskeletal:neg Allergy/Immunology: neg Neurological: neg Psychiatric: neg Sleep : neg   ALLERGIES: Allergies  Allergen Reactions  . Penicillins Other (See Comments)    unknown  . Morphine And Related Rash    HOME MEDICATIONS: Outpatient Medications Prior to Visit  Medication Sig Dispense Refill  . busPIRone (BUSPAR) 5 MG tablet Take 5 mg by mouth 3 (three) times daily.    . butalbital-acetaminophen-caffeine (FIORICET, ESGIC) 50-325-40 MG tablet Take 1 tablet by mouth every 6 (six) hours as needed for headache. 10 tablet 3  . divalproex (DEPAKOTE) 500 MG DR tablet Take 1 tablet (500 mg total) by mouth 2 (two) times daily. 60 tablet 5  . ethosuximide (ZARONTIN) 250 MG capsule Take 1 capsule (250 mg total) by mouth 2 (two) times daily. 60 capsule  5  . pregabalin (LYRICA) 225 MG capsule Take 250 mg by mouth daily.     . traZODone (DESYREL) 50 MG tablet Take 50 mg by mouth at bedtime.     Marland Kitchen venlafaxine (EFFEXOR) 37.5 MG tablet Take 37.5 mg by mouth 2 (two) times daily.    . pregabalin (LYRICA) 75 MG capsule Take 75 mg by mouth 2 (two) times daily.    Marland Kitchen lurasidone (LATUDA) 40 MG TABS tablet Take 40 mg by mouth daily with breakfast.    .  pantoprazole (PROTONIX) 40 MG tablet Take 40 mg by mouth daily.     Facility-Administered Medications Prior to Visit  Medication Dose Route Frequency Provider Last Rate Last Dose  . ondansetron (ZOFRAN) 4 mg in sodium chloride 0.9 % 50 mL IVPB  4 mg Intravenous Once Larey Seat, MD      . valproate (DEPACON) 500 mg in sodium chloride 0.9 % 100 mL IVPB  500 mg Intravenous Continuous Larey Seat, MD        PAST MEDICAL HISTORY: Past Medical History:  Diagnosis Date  . Anxiety   . Bipolar 1 disorder ([place])   . Epilepsy ([place])   . Fibromyalgia   . Headache   . Hepatitis B   . Hypertension   . Osteoarthritis   . Osteoporosis   . PTSD (post-traumatic stress disorder)   . Seizures ([place])   . TBI (traumatic brain injury) ([place])   . Thyroid disease     PAST SURGICAL HISTORY: Past Surgical History:  Procedure Laterality Date  . HEAD HARDWARE REMOVAL  Pt has a plate in her head  . TUBAL LIGATION      FAMILY HISTORY: Family History  Problem Relation Age of Onset  . Cancer Mother   . Diabetes Mellitus II Father     SOCIAL HISTORY: Social History   Social History  . Marital status: Single    Spouse name: N/A  . Number of children: N/A  . Years of education: N/A   Occupational History  . Not on file.   Social History Main Topics  . Smoking status: Never Smoker  . Smokeless tobacco: Never Used  . Alcohol use No  . Drug use: Yes    Types: Cocaine  . Sexual activity: Not on file   Other Topics Concern  . Not on file   Social History Narrative   Drinks rare caffeine.     PHYSICAL EXAM  Vitals:   03/19/16 1153  BP: 120/81  Pulse: 69  Weight: 179 lb 9.6 oz (81.5 kg)  Height: 5\' 4"  (1.626 m)   Body mass index is 30.83 kg/m.  Generalized: Well developed, Obese female in no acute distress  Head: normocephalic and atraumatic,. Oropharynx benign  Neck: Supple, no carotid bruits  Cardiac: Regular rate rhythm, no murmur  Musculoskeletal: No deformity    Neurological examination   Mentation: Alert oriented to time, place, history taking. Attention span and concentration appropriate. Recent and remote memory intact.  Follows all commands speech and language fluent.   Cranial nerve II-XII: Pupils were equal round reactive to light extraocular movements were full, visual field were full on confrontational test. Facial sensation and strength were normal. hearing was intact to finger rubbing bilaterally. Uvula tongue midline. head turning and shoulder shrug were normal and symmetric.Tongue protrusion into cheek strength was normal. Motor: normal bulk and tone, full strength in the BUE, BLE, fine finger movements normal, no pronator drift. No focal weakness Sensory: normal and symmetric to light touch,  pinprick, and  Vibration, in the upper and lower extremities  Coordination: finger-nose-finger, heel-to-shin bilaterally, no dysmetria Reflexes: Brachioradialis 2/2, biceps 2/2, triceps 2/2, patellar 2/2, Achilles 2/2, plantar responses were flexor bilaterally. Gait and Station: Rising up from seated position without assistance, normal stance,  moderate stride, good arm swing, smooth turning, able to perform tiptoe, and heel walking without difficulty. Tandem gait is steady  DIAGNOSTIC DATA (LABS, IMAGING, TESTING) - I reviewed patient records, labs, notes, testing and imaging myself where available.  Lab Results  Component Value Date   WBC 10.6 (H) 01/21/2016   HGB 14.7 01/21/2016   HCT 43.6 01/21/2016   MCV 93.2 01/21/2016   PLT 188 01/21/2016      Component Value Date/Time   NA 138 01/21/2016 0036   K 4.1 01/21/2016 0036   CL 106 01/21/2016 0036   CO2 24 01/21/2016 0036   GLUCOSE 97 01/21/2016 0036   BUN 10 01/21/2016 0036   CREATININE 0.81 01/21/2016 0036   CALCIUM 9.3 01/21/2016 0036   PROT 4.9 (L) 11/02/2015 0647   ALBUMIN 3.0 (L) 11/02/2015 0647   AST 11 (L) 11/02/2015 0647   ALT 11 (L) 11/02/2015 0647   ALKPHOS 34 (L)  11/02/2015 0647   BILITOT 0.2 (L) 11/02/2015 0647   GFRNONAA >60 01/21/2016 0036   GFRAA >60 01/21/2016 0036   ASSESSMENT AND PLAN  41 y.o. year old female  here with seizure-type events migraine headaches and noncompliance of medications in the past last Zarontin level was 0 on 02/06/2016 DISCUSSED with Dr. Brett Fairy Increase Depakote to 750 mg twice daily Continue Zarontin 250 mg twice daily Will await lab results Follow-up as planned with Dr. Brett Fairy Patient made aware she cannot  Drive  I spent 25  in total face to face time with the patient more than 50% of which was spent counseling and coordination of care, reviewing test results reviewing past  medical record and  medications and discussing and reviewing the diagnosis of seizure and precautions.  and further treatment options. , Rayburn Ma, [facility], APRN  [facility] [address] [place], [place] [address] [phone]

## 2016-03-19 NOTE — Patient Instructions (Addendum)
Increase Depakote to 750 mg twice daily Continue Zarontin 250 mg twice daily Will await lab results Follow-up in 2 months with [facility]

## 2016-03-20 NOTE — Progress Notes (Signed)
I agree with the assessment and plan as directed by NP .The patient is known to me .   Belisa Eichholz, MD  

## 2016-03-20 NOTE — Telephone Encounter (Signed)
I called pt and she is still with migraine today, which I told her that she may need to go to urgent care if gets severe.  She was hoping her medications would kick in.  I gave her the results of her  VPA. Low.  She states that she has been taking her meds (if sick in am will take both in pm) these of both depakote and zarontin.  She has been taking her meds as prescribed today. VPA 750 mg po bid and zarontin 250mg  po bid.  Zarontin level still pending.

## 2016-03-21 LAB — VALPROIC ACID LEVEL: VALPROIC ACID LVL: 27 ug/mL — AB (ref 50–100)

## 2016-03-21 LAB — ETHOSUXIMIDE LEVEL: ETHOSUXIMIDE LVL: 45 ug/mL (ref 40–100)

## 2016-03-23 NOTE — Telephone Encounter (Signed)
I called the patient. There was no answer. I will try the patient again tomorrow.

## 2016-03-23 NOTE — Telephone Encounter (Addendum)
I spoke to pt.  She states that her headache/migraine same.  Behind L eye, to eyebrow, left side of head.  Level. 5-6, has vomited last 2 days.  (intensified pain).  No relief with medications.  Taking both depakote 750mg  po bid and zarontin 250mg  po bid.  Phenergan does not help.  She did not go to ED over the weekend.  Has taken fiorcet and has not helped really.  She wonders if taking both seizure meds would cause her not absorb one (has she states she has been taking)?

## 2016-03-23 NOTE — Telephone Encounter (Signed)
Please call the patient and check to see how her headache is. Kristin Braun increased depakote so that may be beneficial for headaches.

## 2016-03-24 ENCOUNTER — Other Ambulatory Visit: Payer: Medicaid Other

## 2016-03-25 ENCOUNTER — Ambulatory Visit
Admission: RE | Admit: 2016-03-25 | Discharge: 2016-03-25 | Disposition: A | Discharge status: Home / Self Care | Payer: Medicaid Other | Source: Ambulatory Visit | Attending: Orthopedic Surgery | Admitting: Orthopedic Surgery

## 2016-03-25 DIAGNOSIS — M542 Cervicalgia: Secondary | ICD-10-CM

## 2016-03-27 ENCOUNTER — Telehealth: Payer: Self-pay | Admitting: *Deleted

## 2016-03-27 NOTE — Telephone Encounter (Signed)
Received report of MRI cervical spine on pt. Ordered by Dr. Lynann Bologna.   In EPIC.  CM/NP saw last as a [place] appt for sz.

## 2016-03-30 NOTE — Telephone Encounter (Signed)
Noted  

## 2016-04-08 ENCOUNTER — Encounter: Payer: Self-pay | Admitting: Neurology

## 2016-04-08 ENCOUNTER — Ambulatory Visit (INDEPENDENT_AMBULATORY_CARE_PROVIDER_SITE_OTHER): Payer: Medicaid Other | Admitting: Neurology

## 2016-04-08 VITALS — BP 110/72 | HR 80 | Resp 16 | Ht 64.0 in | Wt 178.0 lb

## 2016-04-08 DIAGNOSIS — G43111 Migraine with aura, intractable, with status migrainosus: Secondary | ICD-10-CM

## 2016-04-08 DIAGNOSIS — S062X4S Diffuse traumatic brain injury with loss of consciousness of 6 hours to 24 hours, sequela: Secondary | ICD-10-CM | POA: Diagnosis not present

## 2016-04-08 DIAGNOSIS — F0781 Postconcussional syndrome: Secondary | ICD-10-CM

## 2016-04-08 MED ORDER — BUTORPHANOL TARTRATE 10 MG/ML NA SOLN: 1.0000 | NASAL | 0 refills | Status: DC | PRN

## 2016-04-08 MED ORDER — DIVALPROEX SODIUM ER 250 MG PO TB24: 750.0000 mg | ORAL_TABLET | Freq: Two times a day (BID) | ORAL | 3 refills | Status: DC

## 2016-04-08 MED ORDER — ETHOSUXIMIDE 250 MG PO CAPS: 250.0000 mg | ORAL_CAPSULE | Freq: Two times a day (BID) | ORAL | 5 refills | Status: DC

## 2016-04-08 MED ORDER — VALPROATE SODIUM 500 MG/5ML IV SOLN: 500.0000 mg | INTRAVENOUS | Status: DC

## 2016-04-08 NOTE — Addendum Note (Signed)
Addended by: Larey Seat on: 04/08/2016 10:42 AM   Modules accepted: Orders
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
## 2016-04-08 NOTE — Patient Instructions (Signed)
Epilepsy °Epilepsy is a condition in which a person has repeated seizures over time. A seizure is a sudden burst of abnormal electrical and chemical activity in the brain. Seizures can cause a change in attention, behavior, or the ability to remain awake and alert (altered mental status). °Epilepsy increases a person's risk of falls, accidents, and injury. It can also lead to complications, including: °· Depression. °· Poor memory. °· Sudden unexplained death in epilepsy (SUDEP). This complication is rare, and its cause is not known. °Most people with epilepsy lead normal lives. °What are the causes? °This condition may be caused by: °· A head injury. °· An injury that happens at birth. °· A high fever during childhood. °· A stroke. °· Bleeding that goes into or around the brain. °· Certain medicines and drugs. °· Having too little oxygen for a long period of time. °· Abnormal brain development. °· Certain infections, such as meningitis and encephalitis. °· Brain tumors. °· Conditions that are passed along from parent to child (are hereditary). °What are the signs or symptoms? °Symptoms of a seizure vary greatly from person to person. They include: °· Convulsions. °· Stiffening of the body. °· Involuntary movements of the arms or legs. °· Loss of consciousness. °· Breathing problems. °· Falling suddenly. °· Confusion. °· Head nodding. °· Eye blinking or fluttering. °· Lip smacking. °· Drooling. °· Rapid eye movements. °· Grunting. °· Loss of bladder control and bowel control. °· Staring. °· Unresponsiveness. °Some people have symptoms right before a seizure happens (aura) and right after a seizure happens. Symptoms of an aura include: °· Fear or anxiety. °· Nausea. °· Feeling like the room is spinning (vertigo). °· A feeling of having seen or heard something before (deja vu). °· Odd tastes or smells. °· Changes in vision, such as seeing flashing lights or spots. °Symptoms that follow a seizure  include: °· Confusion. °· Sleepiness. °· Headache. °How is this diagnosed? °This condition is diagnosed based on: °· Your symptoms. °· Your medical history. °· A physical exam. °· A neurological exam. A neurological exam is similar to a physical exam. It involves checking your strength, reflexes, coordination, and sensations. °· Tests, such as: °¨ An electroencephalogram (EEG). This is a painless test that creates a diagram of your brain waves. °¨ An MRI of the brain. °¨ A CT scan of the brain. °¨ A lumbar puncture, also called a spinal tap. °¨ Blood tests to check for signs of infection or abnormal blood chemistry. °How is this treated? °There is no cure for this condition, but treatment can help control seizures. Treatment may involve: °· Taking medicines to control seizures. These include medicines to prevent seizures and medicines to stop seizures as they occur. °· Having a device called a vagus nerve stimulator implanted in the chest. The device sends electrical impulses to the vagus nerve and to the brain to prevent seizures. This treatment may be recommended if medicines do not help. °· Brain surgery. There are several kinds of surgeries that may be done to stop seizures from happening or to reduce how often seizures happen. °· Having regular blood tests. You may need to have blood tests regularly to check that you are getting the right amount of medicine. °Once this condition has been diagnosed, it is important to begin treatment as soon as possible. For some people, epilepsy eventually goes away. °Follow these instructions at home: °Medicines  ° °· Take over-the-counter and prescription medicines only as told by your health care provider. °·   Avoid any substances that may prevent your medicine from working properly, such as alcohol. °Activity  °· Get enough rest. Lack of sleep can make seizures more likely to occur. °· Follow instructions from your health care provider about driving, swimming, and doing any  other activities that would be dangerous if you had a seizure. °Educating others  °Teach friends and family what to do if you have a seizure. They should: °· Lay you on the ground to prevent a fall. °· Cushion your head and body. °· Loosen any tight clothing around your neck. °· Turn you on your side. If vomiting occurs, this helps keep your airway clear. °· Stay with you until you recover. °· Not hold you down. Holding you down will not stop the seizure. °· Not put anything in your mouth. °· Know whether or not you need emergency care. °General instructions  °· Avoid anything that has ever triggered a seizure for you. °· Keep a seizure diary. Record what you remember about each seizure, especially anything that might have triggered the seizure. °· Keep all follow-up visits as told by your health care provider. This is important. °Contact a health care provider if: °· Your seizure pattern changes. °· You have symptoms of infection or another illness. This might increase your risk of having a seizure. °Get help right away if: °· You have a seizure that does not stop after 5 minutes. °· You have several seizures in a row without a complete recovery in between seizures. °· You have a seizure that makes it harder to breathe. °· You have a seizure that is different from previous seizures. °· You have a seizure that leaves you unable to speak or use a part of your body. °· You did not wake up immediately after a seizure. °This information is not intended to replace advice given to you by your health care provider. Make sure you discuss any questions you have with your health care provider. °Document Released: 02/02/2005 Document Revised: 08/31/2015 Document Reviewed: 08/13/2015 °Elsevier Interactive Patient Education © 2017 [employer] ° °

## 2016-04-09 ENCOUNTER — Telehealth: Payer: Self-pay | Admitting: Neurology

## 2016-04-09 NOTE — Telephone Encounter (Signed)
Called Patient and left her a message with details of [facility] apt. With Dr. Joesph July 06/20/2016 arrive at 8:30. Patient is on CX list for sooner apt.

## 2016-05-11 ENCOUNTER — Other Ambulatory Visit: Payer: Self-pay | Admitting: Neurology

## 2016-05-11 MED ORDER — BUTORPHANOL TARTRATE 10 MG/ML NA SOLN: 1.0000 | NASAL | 0 refills | Status: DC | PRN

## 2016-05-11 NOTE — Telephone Encounter (Signed)
[facility] Received a receipt of confirmation.

## 2016-05-11 NOTE — Addendum Note (Signed)
Addended by: Lester [place] A on: 05/11/2016 03:34 PM   Modules accepted: Orders

## 2016-05-11 NOTE — Telephone Encounter (Signed)
Patient called office requesting refill for butorphanol (STADOL) 10 MG/ML nasal spray.  Pharmacy- [facility] [place]

## 2016-05-14 ENCOUNTER — Telehealth: Payer: Self-pay | Admitting: Neurology

## 2016-05-14 NOTE — Telephone Encounter (Signed)
I called pt and advised her that Dr. Brett Fairy does not want to make any changes at this point but that we will await the EMU results. Pt verbalized understanding.

## 2016-05-14 NOTE — Telephone Encounter (Signed)
No change, will await monitoring results. CD

## 2016-05-14 NOTE — Telephone Encounter (Signed)
I called pt. She is taking the depakote 750 mg BID and zarontin 250 mg BID as prescribed. However, she has noticed an increase in frequency in mini seizures over the past few days. She did not lose consciousness for the episodes (which lasted about 5 minutes) but was very tired afterwards. Pt says that she did lose her bladder during a couple episodes and has resorted to wearing pads in case it happens again. Pt is scheduled for an EMU visit on 06/30/2016. Pt wants to know if she needs a medication change?

## 2016-05-14 NOTE — Telephone Encounter (Signed)
Pt called to inform that within the last few days she has had several mini seizures and wanted the nurse informed to see if maybe an adjustment would be needed to medication

## 2016-05-18 ENCOUNTER — Telehealth: Payer: Self-pay | Admitting: Neurology

## 2016-05-18 ENCOUNTER — Other Ambulatory Visit (INDEPENDENT_AMBULATORY_CARE_PROVIDER_SITE_OTHER): Payer: Self-pay

## 2016-05-18 ENCOUNTER — Other Ambulatory Visit: Payer: Self-pay | Admitting: Neurology

## 2016-05-18 DIAGNOSIS — Z0289 Encounter for other administrative examinations: Secondary | ICD-10-CM

## 2016-05-18 DIAGNOSIS — G40909 Epilepsy, unspecified, not intractable, without status epilepticus: Secondary | ICD-10-CM

## 2016-05-18 NOTE — Telephone Encounter (Signed)
I called pt's mother, York Ram, per DPR, and had an extended conversation with her. Pt's mother reports that this weekend, pt had back to back seizures with trouble breathing with these seizures. Pt usually has right hand twitching, mouth twitching, and some shaking with her seizures, but no trouble breathing or catching her breath.   Pt's mother is very concerned about these seizures where it seems that the pt is not breathing. The EMU appt at [facility] is not until May, and Dr. Brett Fairy did not want to change any of the pt's medications when I spoke to the pt last week about the increase in frequency of her seizures. Pt's mother says that the pt is taking her medications as prescribed. Pt's mother is quite concerned and wants to know if when pt has a seizure should they call 911, or is there a rescue medication that the pt can take to stop the seizures, or if we can get the EMU appt moved up sooner. I advised pt's mother that Dr. Brett Fairy is out of the office this week, but I will send this message to our WID to advice. Pt's mother verbalized understanding.

## 2016-05-18 NOTE — Telephone Encounter (Signed)
Lets have her come in and get Depakote level checked (I put the order in).    We can adjust the dose if still low.

## 2016-05-18 NOTE — Telephone Encounter (Signed)
I have called Sandy at [facility] and left her a message [phone]. Lovey Newcomer is out of the office until 05/19/2016.

## 2016-05-18 NOTE — Telephone Encounter (Signed)
Pt mother calling with concerns over daughter having 3 seizures this weekend 1 different than the others, pt mother concerned for daughters life due to an increase in the seizures.  Wants to know what more can be done

## 2016-05-18 NOTE — Telephone Encounter (Signed)
I called pt. I asked her to have someone drive her here and back today to have her labs checked. Pt is agreeable to this and will see if her mom or son can take her to have her blood drawn today and I asked her to arrive before 4:30pm. Pt verbalized understanding.

## 2016-05-19 LAB — VALPROIC ACID LEVEL: Valproic Acid Lvl: 83 ug/mL (ref 50–100)

## 2016-05-20 ENCOUNTER — Telehealth: Payer: Self-pay

## 2016-05-20 DIAGNOSIS — R569 Unspecified convulsions: Secondary | ICD-10-CM

## 2016-05-20 MED ORDER — TOPIRAMATE 50 MG PO TABS: 50.0000 mg | ORAL_TABLET | Freq: Two times a day (BID) | ORAL | 0 refills | Status: DC

## 2016-05-20 NOTE — Telephone Encounter (Signed)
I called pt. I advised her that her depakote is at a good level. Pt says that she hasn't had any seizures since Saturday, but would like to start topamax. She says that she was on topamax years ago. Potential medication side effects were discussed with the patient for topamax. [facility] I asked her to use barrier protection when taking topamax to prevent possible fetal defects. Pt reports that she is not sexually active. Pt verbalized understanding. Pt had no questions at this time but was encouraged to call back if questions arise.

## 2016-05-20 NOTE — Telephone Encounter (Signed)
-----   Message from Britt Bottom, MD sent at 05/20/2016  8:49 AM EDT ----- Her Depakote is at a good level.    If she is still having seizures, we can add Topamax 50 mg bid    (50 mg #60  One po bid)

## 2016-06-10 ENCOUNTER — Telehealth: Payer: Self-pay | Admitting: Neurology

## 2016-06-10 DIAGNOSIS — R569 Unspecified convulsions: Secondary | ICD-10-CM

## 2016-06-10 NOTE — Telephone Encounter (Signed)
Pt called to inform that her mental health Dr increased her Latuda to 40mg  and she was advised by that Dr to notify Dr Dohmeier asap.  Pt said that the Dr increased all of the dosages on her mental health medication.  Pt can be reached at [phone]

## 2016-06-11 NOTE — Telephone Encounter (Signed)
I called patient back. She states that she is having auditory and visual hallucinations. They usually occur before a seizure, she asks if these are related to her seizures or her mental health?  Patient has spoke with psychiatrist and he changed her meds: Increased Latuda to 40mg  a day, Buspar 15mg  TID, Trazodone 200mg  at night, and Effexor 75mg  in the morning.

## 2016-06-15 NOTE — Telephone Encounter (Signed)
Pt called to advise appt @ [facility] has been moved up to 5/7.  FYI

## 2016-06-15 NOTE — Telephone Encounter (Signed)
Noted, this is good news, thanks.

## 2016-06-16 ENCOUNTER — Telehealth: Payer: Self-pay | Admitting: Neurology

## 2016-06-16 MED ORDER — TOPIRAMATE 50 MG PO TABS: 50.0000 mg | ORAL_TABLET | Freq: Every day | ORAL | 5 refills | Status: DC

## 2016-06-16 MED ORDER — BUTORPHANOL TARTRATE 10 MG/ML NA SOLN: 1.0000 | NASAL | 0 refills | Status: DC | PRN

## 2016-06-16 MED ORDER — TOPIRAMATE 50 MG PO TABS: 50.0000 mg | ORAL_TABLET | Freq: Two times a day (BID) | ORAL | 5 refills | Status: DC

## 2016-06-16 NOTE — Addendum Note (Signed)
Addended by: Larey Seat on: 06/16/2016 04:01 PM   Modules accepted: Orders

## 2016-06-16 NOTE — Telephone Encounter (Addendum)
Stadol refilled , fioricet not refilled.  Next visit with me can wait until EMU stay is completed.  Hallucinations can be related to seizures, with onset in the temporal lobe.  psychiatric background is also possible.  CD

## 2016-06-16 NOTE — Telephone Encounter (Signed)
[facility] Received a receipt of confirmation.

## 2016-06-16 NOTE — Telephone Encounter (Signed)
Pt called me back. I advised her that Dr. Brett Fairy refilled the stadol and topamax, but not the fioricet. She says that she is taking the topamax 50 mg BID as prescribed. I spoke to Dr. Brett Fairy and she gave me a verbal order to change the topamax to 50 mg BID. I advised her that the appt in May with Dr. Brett Fairy will need to be cancelled because Dr. Brett Fairy wants the EMU visit completed before the appt with Dr. Brett Fairy. Pt is agreeable to this. I also advised her that her hallucinations can be related to either her seizures or mental health. Pt verbalized understanding. Pt will call us after the EMU visit to get rescheduled with Dr. Brett Fairy. Pt says that her EMU stay has been rescheduled several times.

## 2016-06-16 NOTE — Telephone Encounter (Signed)
I called pt to discuss. No answer, left a message asking her to call me back. 

## 2016-06-16 NOTE — Addendum Note (Signed)
Addended by: Lester [place] A on: 06/16/2016 04:35 PM   Modules accepted: Orders

## 2016-06-16 NOTE — Telephone Encounter (Signed)
Patient called office requesting refill for butorphanol (STADOL) 10 MG/ML nasal spray, topiramate (TOPAMAX) 50 MG tablet and Butalbital-acetaminophen-caffeine .  Pharmacy- [place]

## 2016-06-16 NOTE — Telephone Encounter (Signed)
Patient called office EMU has been rescheduled until June 25th.  Patient isn't sure if she needs to keep fu visit with Dr. Brett Fairy on 07/06/16.  Please call

## 2016-06-22 NOTE — Telephone Encounter (Signed)
I received a pa request for pt's stadol. It is my understanding that pt pays out of pocket for stadol. I called pt and verified this, she asked [name] not to complete the pa, she is going to use [email or web address] coupon and pay out of pocket. Will disregard the pa requests.

## 2016-07-06 ENCOUNTER — Ambulatory Visit: Payer: Medicaid Other | Admitting: Neurology

## 2016-08-04 ENCOUNTER — Telehealth: Payer: Self-pay | Admitting: Neurology

## 2016-08-04 NOTE — Telephone Encounter (Signed)
I have spoken with Kristin Braun this afternoon.  She is on Zarontin and Depakote for h/a's, sz. d/o.  She lost ins. on 07/16/16, is reapplying for ins.  Has been out of meds since 07/21/16.  She had to cancel EMU, but will r/s it once she has ins. again.  I have advised that she may apply for pt. assistance thru the  companies that manufacture Zarontin and Depakote.  She verbalized understanding of same, sts. will apply for pt. assistance.  I have offered to help her with this now, but she has declined at this time; sts. she feels she can do this  herself. Hilton Cork

## 2016-08-04 NOTE — Telephone Encounter (Signed)
Patient called office in reference to currently not having any medical insurance and not being able to be on ethosuximide (ZARONTIN) 250 MG capsule and divalproex (DEPAKOTE ER) 250 MG 24 hr tablet June 5th was last time patient had medication. Per patient her migraines have picked up along with seizures.

## 2016-08-12 ENCOUNTER — Telehealth: Payer: Self-pay | Admitting: *Deleted

## 2016-08-12 ENCOUNTER — Telehealth: Payer: Self-pay | Admitting: Neurology

## 2016-08-12 ENCOUNTER — Other Ambulatory Visit: Payer: Self-pay | Admitting: *Deleted

## 2016-08-12 MED ORDER — BUTORPHANOL TARTRATE 10 MG/ML NA SOLN: 1.0000 | NASAL | 0 refills | Status: DC | PRN

## 2016-08-12 NOTE — Telephone Encounter (Signed)
Patient has had a headache for 4 days. Should she see Dr. Brett Fairy or go to the ED?

## 2016-08-12 NOTE — Addendum Note (Signed)
Addended by: Lester Powers A on: 08/12/2016 02:11 PM   Modules accepted: Orders

## 2016-08-12 NOTE — Telephone Encounter (Signed)
First Stadol nasal spray printed today has been destroyed - it printed on plain paper.  The rx has been reprinted on prescription paper.

## 2016-08-12 NOTE — Addendum Note (Signed)
Addended by: Larey Seat on: 08/12/2016 03:55 PM   Modules accepted: Orders

## 2016-08-12 NOTE — Telephone Encounter (Signed)
[facility] at [phone].

## 2016-08-12 NOTE — Telephone Encounter (Signed)
I called pt back. I advised her that if she is in severe pain, she should proceed to ER. Pt reports that she does not have insurance at this time and didn't think she could come in for an infusion here at [facility]. I verified with our infusion suite that is is correct, pt would have to pay out of pocket for an infusion here and best option would be to go to ER. Pt is asking for a refill on her stadol as well,but in the meantime, will proceed to ER. Pt is tearful on the phone. I did speak with Dr. Brett Fairy and she agrees that pt should go to ER for severe headache. Pt verbalized understanding.

## 2016-08-12 NOTE — Telephone Encounter (Signed)
Noted, yes. PA for stadol through medicaid was denied this year and pt reported to me earlier that she does not have insurance. I have sent the stadol refill request to Dr. Brett Fairy.

## 2016-08-12 NOTE — Telephone Encounter (Signed)
Pt called back Kristin Braun (STADOL) 10 MG/ML nasal spray, she has been reminded that she is to pay out of pocket and that the refill request has been forwarded to Dr Brett Fairy

## 2016-08-18 ENCOUNTER — Telehealth: Payer: Self-pay | Admitting: Neurology

## 2016-08-18 ENCOUNTER — Telehealth: Payer: Self-pay | Admitting: Nurse Practitioner

## 2016-08-18 NOTE — Telephone Encounter (Signed)
Patient calling to discuss that she should not drive which was noted in last OV.

## 2016-08-20 NOTE — Telephone Encounter (Signed)
Pt called back with questions she had earlier in the week. Pt is applying for disability and she was wanting to confirm that in her last office visit that it was mentioned for disability purposes. I stated that it was mentioned in her visit from 03/19/2016. She was appreciative for the information

## 2016-08-20 NOTE — Telephone Encounter (Signed)
Returned pt call for her question she called about couple days ago. No answer. LVM

## 2016-08-24 ENCOUNTER — Telehealth: Payer: Self-pay | Admitting: Neurology

## 2016-08-24 DIAGNOSIS — G43111 Migraine with aura, intractable, with status migrainosus: Secondary | ICD-10-CM

## 2016-08-24 DIAGNOSIS — S062X4S Diffuse traumatic brain injury with loss of consciousness of 6 hours to 24 hours, sequela: Secondary | ICD-10-CM

## 2016-08-24 DIAGNOSIS — R569 Unspecified convulsions: Secondary | ICD-10-CM

## 2016-08-24 NOTE — Telephone Encounter (Signed)
The questionnaire can be filled out here as [facility] based on our office record information. There is before meals do at the time the questionnaire is left here for [name] to fill. Usually request several days time before we return the questionnaire. Unfortunately [name] Senior we will have to make a new appointment if she has not been seen here as a patient in the last 3 years. I have seen her multiple times as a companion to her daughter's visits at [facility].

## 2016-08-24 NOTE — Telephone Encounter (Signed)
Patient's Mom is calling to speak to Dr. Brett Fairy to discuss patient's condition.

## 2016-08-24 NOTE — Telephone Encounter (Signed)
Questions for Dr Dohmeier regarding daughter. She is being informed by lawyer to have a form filled out to help with disability. She is asking please call her back for her questions regards to her daughter. Pt mom also was upset that she needed a referral in order to be seen as a pt when she has been seeing Dr Brett Fairy for 20+ years. I explained that I had no record of her visit in last 10 years and per insurance they would have need to be seen within the last 3 years to not require a refer all. Pt was upset with that but explained that  was an insurance requirement. She stated she had no PCP and that she will ask her husbands PCP if they will refer her. I explained I would address her concerns with Dr Brett Fairy.

## 2016-08-25 NOTE — Telephone Encounter (Signed)
Patients mom Maudie Mercury called office requesting a call back from Dr. Brett Fairy.  Kim states patient received the phone call and was confused.  Please call

## 2016-08-25 NOTE — Telephone Encounter (Signed)
Kristin Braun is returning your call. She can be reached at [phone].

## 2016-08-25 NOTE — Telephone Encounter (Signed)
Called the given number and did not receive any answer, will try again. CD

## 2016-08-25 NOTE — Telephone Encounter (Addendum)
Spoke to [employer] (pt's mother on HIPAA) - call back [phone].  She is requesting to speak directly to Dr. Brett Fairy.  States her daughter's Medicaid has been terminated and her disability case has been denied (being appealed).  The patient is unable to afford an office visit at this time.  She is also unable to afford her medications.  The only prescription her mother is aware of her having at home is Stadol nasal spray. I gave her [email or web address] information in hopes they can find the needed medications at an affordable cost.  I also stressed the importance of continuing her seizure medication.  Her mother stated that she is not taking her medications for bi-polar either and inquired about management of that condition here.  I gently let her know that Dr. Brett Fairy does not manage bi-polar disorder and encouraged continuation of care with her established psychiatrist.

## 2016-08-26 MED ORDER — BUTORPHANOL TARTRATE 10 MG/ML NA SOLN: 1.0000 | NASAL | 0 refills | Status: DC | PRN

## 2016-08-26 MED ORDER — DIVALPROEX SODIUM ER 250 MG PO TB24: 750.0000 mg | ORAL_TABLET | Freq: Two times a day (BID) | ORAL | 3 refills | Status: DC

## 2016-08-26 MED ORDER — TOPIRAMATE 50 MG PO TABS: 50.0000 mg | ORAL_TABLET | Freq: Two times a day (BID) | ORAL | 5 refills | Status: DC

## 2016-08-26 MED ORDER — ETHOSUXIMIDE 250 MG PO CAPS: 250.0000 mg | ORAL_CAPSULE | Freq: Two times a day (BID) | ORAL | 5 refills | Status: DC

## 2016-08-26 NOTE — Telephone Encounter (Signed)
I refilled the seizure medication which has been used for migraine control as well. I spoke to her mother for several minutes, she requested a letter describing her neurological condition. CD

## 2016-08-26 NOTE — Addendum Note (Signed)
Addended by: Larey Seat on: 08/26/2016 05:11 PM   Modules accepted: Orders

## 2016-08-28 ENCOUNTER — Encounter: Payer: Self-pay | Admitting: *Deleted

## 2016-08-28 NOTE — Telephone Encounter (Signed)
FYI patient's mother will be faxing over information that will be helpful for letter Dr. Brett Fairy with specific dates.

## 2016-08-28 NOTE — Telephone Encounter (Signed)
The requested letter was written and signed this morning and is ready for pick up or mail.  CD

## 2016-08-28 NOTE — Telephone Encounter (Signed)
Letter up front [facility] for pt. to pick up/fim

## 2016-08-28 NOTE — Telephone Encounter (Signed)
  08-28-2016  To Whom It May Concern,  My patient, Kristin Braun, born on 05-06-1975, has been followed in this office for intractable headaches and a seizure disorder. She has not experienced lasting or significant relief from the treatments initiated, and is due to her seizure disorder not allowed to drive.  It is my medical opinion, that this patient is not able to work full-time or part-time, given her significant seizure and headache history and ongoing impairment.   Sincerely, Dr. Larey Seat, M.D.

## 2016-09-14 NOTE — Telephone Encounter (Signed)
Pt calling to inform that she has been approved for her Medicaid. On 9-19 @ 10am she will have her EMU at [facility], this is a FYI, no call back requested.  Pt said after she will call back to schedule a follow up appointment

## 2016-09-17 ENCOUNTER — Telehealth: Payer: Self-pay | Admitting: Neurology

## 2016-09-17 NOTE — Telephone Encounter (Signed)
Pt calling to inform Dr Brett Fairy that she had to cancel the epilepsy monitor because what she has is partial Medicaid which would not cover this monitor

## 2016-09-17 NOTE — Telephone Encounter (Signed)
Pt would just like to know the cost of medical records request, please call

## 2016-09-21 NOTE — Telephone Encounter (Signed)
Epilepsy monitoring - usually [facility] will make sure that your insurance covers the stay. Did the message that the study is not covered come from [facility] ???

## 2016-09-24 NOTE — Telephone Encounter (Signed)
Called the patient to ask inquire more information on this matter. The patient was unable to answer the call. LVM for pt to call back.

## 2016-09-29 NOTE — Telephone Encounter (Signed)
Patient calling to advise she has not been approved to go to [facility]. Please call to discuss.

## 2016-09-30 NOTE — Telephone Encounter (Signed)
Called and spoke with patient to fully understand what is going on. Pt has partial medicaid that only allows for primary care visit and pap smear. Pt is in process of applying for disability and full medicaid but this is a lengthy process. The partial medicaid doesn't cover her meds and she is having to pay everything out of pocket at moment. Pt is unable to complete the procedure at [facility] at least until she can get her full medicaid. She has checked and they do not cover it. Pt stated she would keep [name] update on when her disability and medicaid are approved.

## 2016-10-12 ENCOUNTER — Telehealth: Payer: Self-pay | Admitting: Neurology

## 2016-10-12 NOTE — Telephone Encounter (Signed)
Patient calling to advise she had a seizure yesterday brought on by a panic attack. After the seizure she had a black spot in her right eye and today there is a constant film over the eye.

## 2016-10-12 NOTE — Telephone Encounter (Signed)
I called pt to discuss. No answer, left a message asking her to call me back. 

## 2016-10-12 NOTE — Telephone Encounter (Signed)
We will wait for medicaid renewal. Please ask her to take her antiepileptic medication- depakote  And zarontin. The eye changes will be temporal.

## 2016-10-12 NOTE — Telephone Encounter (Signed)
Pt has been unable to complete EMU testing since she is not fully covered under medicaid yet. Will send pt's concern to Dr. Brett Fairy.

## 2016-10-13 ENCOUNTER — Other Ambulatory Visit: Payer: Self-pay | Admitting: Obstetrics and Gynecology

## 2016-10-13 DIAGNOSIS — N644 Mastodynia: Secondary | ICD-10-CM

## 2016-10-13 DIAGNOSIS — N6452 Nipple discharge: Secondary | ICD-10-CM

## 2016-10-13 NOTE — Telephone Encounter (Signed)
I called pt a second time to discuss. No answer, left a message asking her to call me back.

## 2016-10-14 NOTE — Telephone Encounter (Signed)
Pt applied for the access 1 med card on the [place] patient assistance. Pt is awaiting for approval on that and if that works for our office then she will try and schedule an apt to look at these things. Pt is taking her medications like prescribed.

## 2016-10-27 ENCOUNTER — Ambulatory Visit (HOSPITAL_COMMUNITY): Payer: Medicaid Other

## 2016-10-27 ENCOUNTER — Ambulatory Visit
Admission: RE | Admit: 2016-10-27 | Discharge: 2016-10-27 | Disposition: A | Discharge status: Home / Self Care | Payer: No Typology Code available for payment source | Source: Ambulatory Visit | Attending: Obstetrics and Gynecology | Admitting: Obstetrics and Gynecology

## 2016-10-27 ENCOUNTER — Other Ambulatory Visit: Payer: Medicaid Other

## 2016-10-27 ENCOUNTER — Encounter (HOSPITAL_COMMUNITY): Payer: Self-pay

## 2016-10-27 ENCOUNTER — Ambulatory Visit: Payer: Medicaid Other

## 2016-10-27 ENCOUNTER — Other Ambulatory Visit: Payer: Self-pay | Admitting: Obstetrics and Gynecology

## 2016-10-27 ENCOUNTER — Ambulatory Visit (HOSPITAL_COMMUNITY)
Admission: RE | Admit: 2016-10-27 | Discharge: 2016-10-27 | Disposition: A | Discharge status: Home / Self Care | Payer: Self-pay | Source: Ambulatory Visit | Attending: Obstetrics and Gynecology | Admitting: Obstetrics and Gynecology

## 2016-10-27 VITALS — BP 122/100 | Temp 98.0°F | Ht 63.0 in | Wt 172.0 lb

## 2016-10-27 DIAGNOSIS — N6489 Other specified disorders of breast: Secondary | ICD-10-CM

## 2016-10-27 DIAGNOSIS — N644 Mastodynia: Secondary | ICD-10-CM

## 2016-10-27 DIAGNOSIS — N6452 Nipple discharge: Secondary | ICD-10-CM

## 2016-10-27 DIAGNOSIS — Z1239 Encounter for other screening for malignant neoplasm of breast: Secondary | ICD-10-CM

## 2016-10-27 NOTE — Patient Instructions (Signed)
Explained breast self awareness with Kristin Braun. Patient did not need a Pap smear today due to last Pap smear was in May 2017 per patient. Let her know BCCCP will cover Pap smears every 3 years unless has a history of abnormal Pap smears. Referred patient to the [facility] for diagnostic mammogram and possible left breast ultrasound. Appointment scheduled for Tuesday, October 27, 2016 at 0920. Kristin Braun verbalized understanding.  [place], Arvil Chaco, RN 11:35 AM

## 2016-10-27 NOTE — Progress Notes (Signed)
Complaints of right breast increasing in size over the past two months, redness under breast, and clear discharge when expresses. Patient complained of left breast pain that comes and goes along with nipple changes over the past two months. Patient rates pain at a 4 out of 10.  Pap Smear:  Pap smear not completed today. Last Pap smear was in May 2017 and normal per patient. Per patient has no history of an abnormal Pap smear. No Pap smear results are in EPIC.  Physical exam: Breasts Right breast larger than left breast that per patient is a change for her over the past two months. No skin abnormalities bilateral breasts. No redness in patient's area of concern. No nipple retraction bilateral breasts. No abnormal nipple changes observed on exam within left breast. No nipple discharge bilateral breasts. Unable to express any nipple discharge on exam. No lymphadenopathy. No lumps palpated bilateral breasts. Complaints of left outer breast tenderness on exam. Referred patient to the [facility] for diagnostic mammogram and possible left breast ultrasound. Appointment scheduled for Tuesday, October 27, 2016 at 0920.        Pelvic/Bimanual No Pap smear completed today since last Pap smear was in May 2017 per patient. Pap smear not indicated per BCCCP guidelines.   Smoking History: Patient has never smoked.  Patient Navigation: Patient education provided. Access to services provided for patient through [facility] program.

## 2016-10-29 ENCOUNTER — Other Ambulatory Visit: Payer: Self-pay | Admitting: Neurology

## 2016-11-02 ENCOUNTER — Other Ambulatory Visit: Payer: Self-pay | Admitting: Neurology

## 2016-11-02 MED ORDER — BUTORPHANOL TARTRATE 10 MG/ML NA SOLN: 1.0000 | NASAL | 0 refills | Status: DC | PRN

## 2016-11-03 ENCOUNTER — Ambulatory Visit
Admission: RE | Admit: 2016-11-03 | Discharge: 2016-11-03 | Disposition: A | Discharge status: Home / Self Care | Payer: No Typology Code available for payment source | Source: Ambulatory Visit | Attending: Obstetrics and Gynecology | Admitting: Obstetrics and Gynecology

## 2016-11-03 ENCOUNTER — Other Ambulatory Visit: Payer: Self-pay | Admitting: Obstetrics and Gynecology

## 2016-11-03 DIAGNOSIS — N6489 Other specified disorders of breast: Secondary | ICD-10-CM

## 2016-11-09 ENCOUNTER — Other Ambulatory Visit: Payer: Self-pay | Admitting: Surgery

## 2016-11-09 DIAGNOSIS — N6022 Fibroadenosis of left breast: Secondary | ICD-10-CM

## 2016-11-10 ENCOUNTER — Other Ambulatory Visit: Payer: Self-pay | Admitting: Surgery

## 2016-11-10 DIAGNOSIS — N6022 Fibroadenosis of left breast: Secondary | ICD-10-CM

## 2016-11-11 ENCOUNTER — Encounter (HOSPITAL_BASED_OUTPATIENT_CLINIC_OR_DEPARTMENT_OTHER): Payer: Self-pay | Admitting: *Deleted

## 2016-11-17 ENCOUNTER — Encounter (HOSPITAL_COMMUNITY): Payer: Self-pay

## 2016-11-17 ENCOUNTER — Encounter (HOSPITAL_COMMUNITY)
Admission: RE | Admit: 2016-11-17 | Discharge: 2016-11-17 | Disposition: A | Discharge status: Home / Self Care | Payer: Self-pay | Source: Ambulatory Visit | Attending: Surgery | Admitting: Surgery

## 2016-11-17 DIAGNOSIS — N6022 Fibroadenosis of left breast: Secondary | ICD-10-CM

## 2016-11-17 DIAGNOSIS — Z01818 Encounter for other preprocedural examination: Secondary | ICD-10-CM | POA: Insufficient documentation

## 2016-11-17 DIAGNOSIS — N6320 Unspecified lump in the left breast, unspecified quadrant: Secondary | ICD-10-CM | POA: Insufficient documentation

## 2016-11-17 HISTORY — DX: Personal history of urinary calculi: Z87.442

## 2016-11-17 HISTORY — DX: Sleep apnea, unspecified: G47.30

## 2016-11-17 HISTORY — DX: Personal history of other diseases of the digestive system: Z87.19

## 2016-11-17 LAB — CBC
HEMATOCRIT: 45.7 % (ref 36.0–46.0)
Hemoglobin: 15.9 g/dL — ABNORMAL HIGH (ref 12.0–15.0)
MCH: 31.3 pg (ref 26.0–34.0)
MCHC: 34.8 g/dL (ref 30.0–36.0)
MCV: 90 fL (ref 78.0–100.0)
Platelets: 232 10*3/uL (ref 150–400)
RBC: 5.08 MIL/uL (ref 3.87–5.11)
RDW: 12.3 % (ref 11.5–15.5)
WBC: 11.9 10*3/uL — ABNORMAL HIGH (ref 4.0–10.5)

## 2016-11-17 LAB — COMPREHENSIVE METABOLIC PANEL
ALK PHOS: 64 U/L (ref 38–126)
ALT: 20 U/L (ref 14–54)
AST: 20 U/L (ref 15–41)
Albumin: 4.3 g/dL (ref 3.5–5.0)
Anion gap: 7 (ref 5–15)
BUN: 5 mg/dL — AB (ref 6–20)
CHLORIDE: 106 mmol/L (ref 101–111)
CO2: 21 mmol/L — ABNORMAL LOW (ref 22–32)
Calcium: 10.3 mg/dL (ref 8.9–10.3)
Creatinine, Ser: 0.7 mg/dL (ref 0.44–1.00)
GLUCOSE: 88 mg/dL (ref 65–99)
POTASSIUM: 3.7 mmol/L (ref 3.5–5.1)
SODIUM: 134 mmol/L — AB (ref 135–145)
TOTAL PROTEIN: 7.1 g/dL (ref 6.5–8.1)
Total Bilirubin: 0.8 mg/dL (ref 0.3–1.2)

## 2016-11-17 LAB — HCG, SERUM, QUALITATIVE: Preg, Serum: NEGATIVE

## 2016-11-17 NOTE — Progress Notes (Signed)
Pt. Is followed by S. Beal in [employer]. For PCP. Also reports that she was seen by cardiology at [facility] because she had been passing out with exertion. Told that the tests that she had were ok & she was not directed to return unless she had a problem. Study not available through care everywhere, requesting via fax.  Pt. Is followed by Dr. 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  Pt. Reports that stress & being excited from fear, etc. Triggers the migraines & seizure activity.

## 2016-11-17 NOTE — Pre-Procedure Instructions (Signed)
Kristin Braun  11/17/2016      [id number] [place], [place] [address] [place] [address] Phone: [phone] Fax: [phone]  [facility] [place], [place] - [place] [address] Phone: [phone] Fax: [phone]    Your procedure is scheduled on 11/19/2016  Report to [facility] Admitting at 7:00 A.M.  Call this number if you have problems the morning of surgery:  [phone]   Remember:  Do not eat food or drink liquids after midnight. On Wednesday NIGHT  EXCEPTION: [place] by 5AM on Thursday a.m.    Take these medicines the morning of surgery with A SIP OF WATER :Buspar, Depakote, Zarontin, Topamax, Effexor.   IF needed may take TUM or/& Stadol spray   Do not wear jewelry, make-up or nail polish.   Do not wear lotions, powders, or perfumes, or deoderant.   Do not shave 48 hours prior to surgery.     Do not bring valuables to the hospital.   [facility] is not responsible for any belongings or valuables.  Contacts, dentures or bridgework may not be worn into surgery.  Leave your suitcase in the car.  After surgery it may be brought to your room.  For patients admitted to the hospital, discharge time will be determined by your treatment team.  Patients discharged the day of surgery will not be allowed to drive home.   Name and phone number of your driver:   With family  Special instructions:  Special Instructions: [facility] - Preparing for Surgery  Before surgery, you can play an important role.  Because skin is not sterile, your skin needs to be as free of germs as possible.  You can reduce the number of germs on you skin by washing with CHG (chlorahexidine gluconate) soap before surgery.  CHG is an antiseptic cleaner which kills germs and bonds with the skin to continue killing  germs even after washing.  Please DO NOT use if you have an allergy to CHG or antibacterial soaps.  If your skin becomes reddened/irritated stop using the CHG and inform your nurse when you arrive at Short Stay.  Do not shave (including legs and underarms) for at least 48 hours prior to the first CHG shower.  You may shave your face.  Please follow these instructions carefully:   1.  Shower with CHG Soap the night before surgery and the  morning of Surgery.  2.  If you choose to wash your hair, wash your hair first as usual with your  normal shampoo.  3.  After you shampoo, rinse your hair and body thoroughly to remove the  Shampoo.  4.  Use CHG as you would any other liquid soap.  You can apply chg directly to the skin and wash gently with scrungie or a clean washcloth.  5.  Apply the CHG Soap to your body ONLY FROM THE NECK DOWN.    Do not use on open wounds or open sores.  Avoid contact with your eyes, ears, mouth and genitals (private parts).  Wash genitals (private parts)   with your normal soap.  6.  Wash thoroughly, paying special attention to the area where your surgery will be performed.  7.  Thoroughly rinse your body with warm water from the neck down.  8.  DO NOT shower/wash with  your normal soap after using and rinsing off   the CHG Soap.  9.  Pat yourself dry with a clean towel.            10.  Wear clean pajamas.            11.  Place clean sheets on your bed the night of your first shower and do not sleep with pets.  Day of Surgery  Do not apply any lotions/deodorants the morning of surgery.  Please wear clean clothes to the hospital/surgery center.  Please read over the following fact sheets that you were given. Pain Booklet, Coughing and Deep Breathing and Surgical Site Infection Prevention

## 2016-11-18 ENCOUNTER — Ambulatory Visit
Admission: RE | Admit: 2016-11-18 | Discharge: 2016-11-18 | Disposition: A | Discharge status: Home / Self Care | Payer: No Typology Code available for payment source | Source: Ambulatory Visit | Attending: Surgery | Admitting: Surgery

## 2016-11-18 DIAGNOSIS — N6022 Fibroadenosis of left breast: Secondary | ICD-10-CM
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
## 2016-11-18 NOTE — H&P (Signed)
Kristin Braun 11/09/2016 1:53 PM Location: [facility] Patient #: [id number] DOB: [birth year] Undefined / Language: [place] / Race: White Female   History of Present Illness Kristin Lam A. Magnus Ivan MD; 11/09/2016 2:15 PM) Patient words: New-Breast.  The patient is a 41 year old female who presents with a breast mass. This is a pleasant female referred by Dr. Frederico Hamman after the recent diagnosis of a complex sclerosing lesion of the left breast. This was found on mammography. She denied any issues with her breasts other than some occasional swelling and discomfort. She denies nipple discharge. She has a significant family history of several family members having ovarian cancer and lung cancer. There is no history of breast cancer. She had noted she underwent a breast biopsy. She is otherwise healthy except for seizure disorder.   Past Surgical History Doristine Devoid, CMA; 11/09/2016 1:53 PM) Breast Biopsy  Left. Oral Surgery   Diagnostic Studies History Doristine Devoid, CMA; 11/09/2016 1:53 PM) Colonoscopy  1-5 years ago Mammogram  within last year Pap Smear  1-5 years ago  Allergies Doristine Devoid, CMA; 11/09/2016 1:55 PM) Amoxicillin *PENICILLINS*  Morphine Sulfate (PF) *ANALGESICS - OPIOID*   Medication History (Chemira Jones, CMA; 11/09/2016 1:56 PM) TraZODone HCl (50MG  Tablet, Oral) Active. Zarontin (250MG  Capsule, Oral) Active. Divalproex Sodium ER (500MG  Tablet ER 24HR, Oral) Active. Topiramate (50MG  Tablet, Oral) Active. BusPIRone HCl (10MG  Tablet, Oral) Active. Medications Reconciled  Social History Doristine Devoid, CMA; 11/09/2016 1:53 PM) Alcohol use  Occasional alcohol use. Caffeine use  Carbonated beverages, Tea. Illicit drug use  Remotely quit drug use. Tobacco use  Never smoker.  Family History Doristine Devoid, CMA; 11/09/2016 1:53 PM) Alcohol Abuse  Brother, Family Members In General, Mother. Arthritis  Family Members In  General, Father, Mother. Cancer  Family Members In General, Mother. Depression  Brother, Family Members In [place], Mother. Diabetes Mellitus  Family Members In General, Father. Heart Disease  Family Members In General, Father. Heart disease in female family member before age 47  Hypertension  Father. Migraine Headache  Brother, Mother. Ovarian Cancer  Family Members In General, Mother. Respiratory Condition  Family Members In General.  Pregnancy / Birth History Doristine Devoid, CMA; 11/09/2016 1:53 PM) Age at menarche  9 years. Gravida  4 Irregular periods  Length (months) of breastfeeding  3-6 Maternal age  64-20 Para  2  Other Problems Doristine Devoid, CMA; 11/09/2016 1:53 PM) Anxiety Disorder  Back Pain  Bladder Problems  Chest pain  Depression  Gastroesophageal Reflux Disease  Kidney Stone  Migraine Headache  Seizure Disorder  Umbilical Hernia Repair     Review of Systems (Chemira Jones CMA; 11/09/2016 1:53 PM) General Present- Appetite Loss, Fatigue, Night Sweats and Weight Loss. Not Present- Chills, Fever and Weight Gain. Skin Present- Dryness. Not Present- Change in Wart/Mole, Hives, Jaundice, New Lesions, Non-Healing Wounds, Rash and Ulcer. HEENT Present- Oral Ulcers, Ringing in the Ears, Seasonal Allergies and Sore Throat. Not Present- Earache, Hearing Loss, Hoarseness, Nose Bleed, Sinus Pain, Visual Disturbances, Wears glasses/contact lenses and Yellow Eyes. Respiratory Present- Chronic Cough. Not Present- Bloody sputum, Difficulty Breathing, Snoring and Wheezing. Breast Present- Breast Pain and Skin Changes. Not Present- Breast Mass and Nipple Discharge. Cardiovascular Present- Difficulty Breathing Lying Down and Palpitations. Not Present- Chest Pain, Leg Cramps, Rapid Heart Rate, Shortness of Breath and Swelling of Extremities. Gastrointestinal Present- Indigestion and Nausea. Not Present- Abdominal Pain, Bloating, Bloody Stool, Change in  Bowel Habits, Chronic diarrhea, Constipation, Difficulty Swallowing, Excessive gas, Gets full quickly at  meals, Hemorrhoids, Rectal Pain and Vomiting. Female Genitourinary Present- Frequency, Nocturia, Pelvic Pain and Urgency. Not Present- Painful Urination. Musculoskeletal Present- Back Pain, Joint Pain, Joint Stiffness, Muscle Pain and Muscle Weakness. Not Present- Swelling of Extremities. Neurological Present- Decreased Memory, Headaches, Numbness, Seizures, Tingling, Trouble walking and Weakness. Not Present- Fainting and Tremor. Psychiatric Present- Anxiety, Bipolar, Change in Sleep Pattern and Depression. Not Present- Fearful and Frequent crying. Endocrine Present- Heat Intolerance and Hot flashes. Not Present- Cold Intolerance, Excessive Hunger, Hair Changes and New Diabetes. Hematology Not Present- Blood Thinners, Easy Bruising, Excessive bleeding, Gland problems, HIV and Persistent Infections.  Vitals (Chemira Jones CMA; 11/09/2016 1:55 PM) 11/09/2016 1:54 PM Weight: 172.8 lb Height: 64in Body Surface Area: 1.84 m Body Mass Index: 29.66 kg/m  Temp.: 74F(Oral)  Pulse: 102 (Regular)  BP: 124/84 (Sitting, Left Arm, Standard)       Physical Exam (Adriaan Maltese A. Magnus Ivan MD; 11/09/2016 2:15 PM) General Mental Status-Alert. General Appearance-Consistent with stated age. Hydration-Well hydrated. Voice-Normal.  Head and Neck Head-normocephalic, atraumatic with no lesions or palpable masses. Trachea-midline. Thyroid Gland Characteristics - normal size and consistency.  Eye Eyeball - Bilateral-Extraocular movements intact. Sclera/Conjunctiva - Bilateral-No scleral icterus.  Chest and Lung Exam Chest and lung exam reveals -quiet, even and easy respiratory effort with no use of accessory muscles and on auscultation, normal breath sounds, no adventitious sounds and normal vocal resonance. Inspection Chest Wall - Normal. Back - normal.  Breast Breast -  Left-Symmetric, Non Tender, No Biopsy scars, no Dimpling, No Inflammation, No Lumpectomy scars, No Mastectomy scars, No Peau d' Orange. Breast - Right-Symmetric, Non Tender, No Biopsy scars, no Dimpling, No Inflammation, No Lumpectomy scars, No Mastectomy scars, No Peau d' Orange. Breast Lump-No Palpable Breast Mass.  Cardiovascular Cardiovascular examination reveals -normal heart sounds, regular rate and rhythm with no murmurs and normal pedal pulses bilaterally.  Abdomen Inspection Inspection of the abdomen reveals - No Hernias. Skin - Scar - no surgical scars. Palpation/Percussion Palpation and Percussion of the abdomen reveal - Soft, Non Tender, No Rebound tenderness, No Rigidity (guarding) and No hepatosplenomegaly. Auscultation Auscultation of the abdomen reveals - Bowel sounds normal.  Neurologic - Did not examine.  Musculoskeletal - Did not examine.  Lymphatic Head & Neck  General Head & Neck Lymphatics: Bilateral - Description - Normal. Axillary  General Axillary Region: Bilateral - Description - Normal. Tenderness - Non Tender. Femoral & Inguinal  Generalized Femoral & Inguinal Lymphatics: Bilateral - Description - Normal. Tenderness - Non Tender.    Assessment & Plan (Cicily Bonano A. Magnus Ivan MD; 11/09/2016 2:16 PM) SCLEROSING ADENOSIS OF BREAST, LEFT (N60.22) Impression: This is a patient with a complex sclerosing lesion of the left breast. Given her family history and mammographic findings as well as pathology, radioactive seed localized left breast lumpectomy is strongly recommended. We discussed the reasons for this. I discussed the surgical procedure in detail. I discussed the risk of the surgery which includes but is not limited to bleeding, infection, need for further surgery or procedures, injury to surrounding structures, cardiopulmonary issues, postoperative recovery, etc. She understands and wishes to proceed with surgery which will be scheduled. We will also  refer her to the cancer center to see genetics counseling given her family history

## 2016-11-19 ENCOUNTER — Ambulatory Visit
Admission: RE | Admit: 2016-11-19 | Discharge: 2016-11-19 | Disposition: A | Discharge status: Home / Self Care | Payer: No Typology Code available for payment source | Source: Ambulatory Visit | Attending: Surgery | Admitting: Surgery

## 2016-11-19 ENCOUNTER — Encounter (HOSPITAL_COMMUNITY): Payer: Self-pay | Admitting: *Deleted

## 2016-11-19 ENCOUNTER — Ambulatory Visit (HOSPITAL_COMMUNITY): Payer: Self-pay | Admitting: Anesthesiology

## 2016-11-19 ENCOUNTER — Encounter (HOSPITAL_COMMUNITY)
Admission: RE | Disposition: A | Discharge status: Home / Self Care | Payer: Self-pay | Source: Ambulatory Visit | Attending: Surgery

## 2016-11-19 ENCOUNTER — Ambulatory Visit (HOSPITAL_COMMUNITY)
Admission: RE | Admit: 2016-11-19 | Discharge: 2016-11-19 | Disposition: A | Discharge status: Home / Self Care | Payer: Self-pay | Source: Ambulatory Visit | Attending: Surgery | Admitting: Surgery

## 2016-11-19 DIAGNOSIS — R51 Headache: Secondary | ICD-10-CM | POA: Insufficient documentation

## 2016-11-19 DIAGNOSIS — F419 Anxiety disorder, unspecified: Secondary | ICD-10-CM | POA: Insufficient documentation

## 2016-11-19 DIAGNOSIS — I10 Essential (primary) hypertension: Secondary | ICD-10-CM | POA: Insufficient documentation

## 2016-11-19 DIAGNOSIS — F329 Major depressive disorder, single episode, unspecified: Secondary | ICD-10-CM | POA: Insufficient documentation

## 2016-11-19 DIAGNOSIS — N6022 Fibroadenosis of left breast: Secondary | ICD-10-CM

## 2016-11-19 DIAGNOSIS — Z79899 Other long term (current) drug therapy: Secondary | ICD-10-CM | POA: Insufficient documentation

## 2016-11-19 DIAGNOSIS — K219 Gastro-esophageal reflux disease without esophagitis: Secondary | ICD-10-CM | POA: Insufficient documentation

## 2016-11-19 DIAGNOSIS — N6320 Unspecified lump in the left breast, unspecified quadrant: Secondary | ICD-10-CM | POA: Insufficient documentation

## 2016-11-19 HISTORY — PX: BREAST LUMPECTOMY WITH RADIOACTIVE SEED LOCALIZATION: SHX6424

## 2016-11-19 HISTORY — DX: Gastro-esophageal reflux disease without esophagitis: K21.9

## 2016-11-19 HISTORY — PX: BREAST EXCISIONAL BIOPSY: SUR124

## 2016-11-19 SURGERY — BREAST LUMPECTOMY WITH RADIOACTIVE SEED LOCALIZATION
Anesthesia: General | Site: Breast | Laterality: Left

## 2016-11-19 MED ORDER — ACETAMINOPHEN 500 MG PO TABS
1000.0000 mg | ORAL_TABLET | ORAL | Status: AC
  Administered 2016-11-19: 1000 mg via ORAL
  Filled 2016-11-19: qty 2

## 2016-11-19 MED ORDER — CHLORHEXIDINE GLUCONATE CLOTH 2 % EX PADS: 6.0000 | MEDICATED_PAD | Freq: Once | CUTANEOUS | Status: DC

## 2016-11-19 MED ORDER — BUPIVACAINE-EPINEPHRINE (PF) 0.25% -1:200000 IJ SOLN
INTRAMUSCULAR | Status: AC
  Filled 2016-11-19: qty 30

## 2016-11-19 MED ORDER — LIDOCAINE 2% (20 MG/ML) 5 ML SYRINGE
INTRAMUSCULAR | Status: AC
  Filled 2016-11-19: qty 5

## 2016-11-19 MED ORDER — OXYCODONE HCL 5 MG PO TABS
ORAL_TABLET | ORAL | Status: AC
  Administered 2016-11-19: 10 mg via ORAL
  Filled 2016-11-19: qty 2

## 2016-11-19 MED ORDER — LACTATED RINGERS IV SOLN
INTRAVENOUS | Status: DC
  Administered 2016-11-19 (×2): via INTRAVENOUS

## 2016-11-19 MED ORDER — FENTANYL CITRATE (PF) 100 MCG/2ML IJ SOLN
50.0000 ug | INTRAMUSCULAR | Status: DC | PRN
  Administered 2016-11-19: 150 ug via INTRAVENOUS

## 2016-11-19 MED ORDER — KETOROLAC TROMETHAMINE 30 MG/ML IJ SOLN
INTRAMUSCULAR | Status: AC
  Administered 2016-11-19: 30 mg via INTRAVENOUS
  Filled 2016-11-19: qty 1

## 2016-11-19 MED ORDER — MEPERIDINE HCL 25 MG/ML IJ SOLN: 6.2500 mg | INTRAMUSCULAR | Status: DC | PRN

## 2016-11-19 MED ORDER — ACETAMINOPHEN 325 MG PO TABS: 650.0000 mg | ORAL_TABLET | ORAL | Status: DC | PRN

## 2016-11-19 MED ORDER — SCOPOLAMINE 1 MG/3DAYS TD PT72
1.0000 | MEDICATED_PATCH | Freq: Once | TRANSDERMAL | Status: DC | PRN
  Administered 2016-11-19: 1.5 mg via TRANSDERMAL
  Filled 2016-11-19: qty 1

## 2016-11-19 MED ORDER — SODIUM CHLORIDE 0.9% FLUSH: 3.0000 mL | Freq: Two times a day (BID) | INTRAVENOUS | Status: DC

## 2016-11-19 MED ORDER — SODIUM CHLORIDE 0.9% FLUSH: 3.0000 mL | INTRAVENOUS | Status: DC | PRN

## 2016-11-19 MED ORDER — MIDAZOLAM HCL 2 MG/2ML IJ SOLN: 1.0000 mg | INTRAMUSCULAR | Status: DC | PRN

## 2016-11-19 MED ORDER — OXYCODONE HCL 5 MG PO TABS: 5.0000 mg | ORAL_TABLET | Freq: Four times a day (QID) | ORAL | 0 refills | Status: DC | PRN

## 2016-11-19 MED ORDER — 0.9 % SODIUM CHLORIDE (POUR BTL) OPTIME
TOPICAL | Status: DC | PRN
  Administered 2016-11-19: 1000 mL

## 2016-11-19 MED ORDER — CIPROFLOXACIN IN D5W 400 MG/200ML IV SOLN
400.0000 mg | INTRAVENOUS | Status: AC
  Administered 2016-11-19: 400 mg via INTRAVENOUS
  Filled 2016-11-19: qty 200

## 2016-11-19 MED ORDER — SODIUM CHLORIDE 0.9 % IV SOLN
250.0000 mL | INTRAVENOUS | Status: DC | PRN
Start: 2016-11-19 — End: 2016-11-19

## 2016-11-19 MED ORDER — FENTANYL CITRATE (PF) 100 MCG/2ML IJ SOLN
INTRAMUSCULAR | Status: AC
  Administered 2016-11-19: 50 ug via INTRAVENOUS
  Filled 2016-11-19: qty 2

## 2016-11-19 MED ORDER — FENTANYL CITRATE (PF) 100 MCG/2ML IJ SOLN: 25.0000 ug | INTRAMUSCULAR | Status: DC | PRN

## 2016-11-19 MED ORDER — GABAPENTIN 300 MG PO CAPS
300.0000 mg | ORAL_CAPSULE | ORAL | Status: AC
  Administered 2016-11-19: 300 mg via ORAL
  Filled 2016-11-19: qty 1

## 2016-11-19 MED ORDER — ONDANSETRON HCL 4 MG/2ML IJ SOLN
INTRAMUSCULAR | Status: DC | PRN
  Administered 2016-11-19: 4 mg via INTRAVENOUS

## 2016-11-19 MED ORDER — KETOROLAC TROMETHAMINE 30 MG/ML IJ SOLN
30.0000 mg | Freq: Once | INTRAMUSCULAR | Status: DC | PRN
  Administered 2016-11-19: 30 mg via INTRAVENOUS

## 2016-11-19 MED ORDER — ONDANSETRON HCL 4 MG/2ML IJ SOLN: 4.0000 mg | Freq: Once | INTRAMUSCULAR | Status: DC | PRN

## 2016-11-19 MED ORDER — ACETAMINOPHEN 650 MG RE SUPP: 650.0000 mg | RECTAL | Status: DC | PRN

## 2016-11-19 MED ORDER — FENTANYL CITRATE (PF) 100 MCG/2ML IJ SOLN
25.0000 ug | INTRAMUSCULAR | Status: DC | PRN
  Administered 2016-11-19 (×2): 50 ug via INTRAVENOUS

## 2016-11-19 MED ORDER — PROPOFOL 10 MG/ML IV BOLUS
INTRAVENOUS | Status: AC
  Filled 2016-11-19: qty 20

## 2016-11-19 MED ORDER — OXYCODONE HCL 5 MG PO TABS
5.0000 mg | ORAL_TABLET | ORAL | Status: DC | PRN
  Administered 2016-11-19: 10 mg via ORAL

## 2016-11-19 MED ORDER — MIDAZOLAM HCL 5 MG/5ML IJ SOLN
INTRAMUSCULAR | Status: DC | PRN
  Administered 2016-11-19: 2 mg via INTRAVENOUS

## 2016-11-19 MED ORDER — BUPIVACAINE-EPINEPHRINE 0.25% -1:200000 IJ SOLN
INTRAMUSCULAR | Status: DC | PRN
  Administered 2016-11-19: 30 mL

## 2016-11-19 MED ORDER — CELECOXIB 200 MG PO CAPS
200.0000 mg | ORAL_CAPSULE | ORAL | Status: AC
  Administered 2016-11-19: 200 mg via ORAL
  Filled 2016-11-19: qty 1

## 2016-11-19 MED ORDER — FENTANYL CITRATE (PF) 250 MCG/5ML IJ SOLN
INTRAMUSCULAR | Status: AC
  Filled 2016-11-19: qty 5

## 2016-11-19 MED ORDER — PROPOFOL 10 MG/ML IV BOLUS
INTRAVENOUS | Status: DC | PRN
  Administered 2016-11-19: 150 mg via INTRAVENOUS

## 2016-11-19 MED ORDER — MIDAZOLAM HCL 2 MG/2ML IJ SOLN
INTRAMUSCULAR | Status: AC
  Filled 2016-11-19: qty 2

## 2016-11-19 MED ORDER — LIDOCAINE HCL (CARDIAC) 20 MG/ML IV SOLN
INTRAVENOUS | Status: DC | PRN
  Administered 2016-11-19: 100 mg via INTRAVENOUS

## 2016-11-19 SURGICAL SUPPLY — 41 items
ADH SKN CLS APL DERMABOND .7 (GAUZE/BANDAGES/DRESSINGS) ×1
APPLIER CLIP 9.375 MED OPEN (MISCELLANEOUS) ×2
APR CLP MED 9.3 20 MLT OPN (MISCELLANEOUS) ×1
BINDER BREAST LRG (GAUZE/BANDAGES/DRESSINGS) IMPLANT
BINDER BREAST XLRG (GAUZE/BANDAGES/DRESSINGS) IMPLANT
BLADE SURG 15 STRL LF DISP TIS (BLADE) ×1 IMPLANT
BLADE SURG 15 STRL SS (BLADE) ×2
CANISTER SUCT 3000ML PPV (MISCELLANEOUS) ×2 IMPLANT
CHLORAPREP W/TINT 26ML (MISCELLANEOUS) ×2 IMPLANT
CLIP APPLIE 9.375 MED OPEN (MISCELLANEOUS) ×1 IMPLANT
COVER PROBE W GEL 5X96 (DRAPES) ×2 IMPLANT
COVER SURGICAL LIGHT HANDLE (MISCELLANEOUS) ×2 IMPLANT
DERMABOND ADVANCED (GAUZE/BANDAGES/DRESSINGS) ×1
DERMABOND ADVANCED .7 DNX12 (GAUZE/BANDAGES/DRESSINGS) ×1 IMPLANT
DEVICE DUBIN SPECIMEN MAMMOGRA (MISCELLANEOUS) ×2 IMPLANT
DRAPE CHEST BREAST 15X10 FENES (DRAPES) ×2 IMPLANT
DRAPE UTILITY XL STRL (DRAPES) ×2 IMPLANT
ELECT CAUTERY BLADE 6.4 (BLADE) ×2 IMPLANT
ELECT REM PT RETURN 9FT ADLT (ELECTROSURGICAL) ×2
ELECTRODE REM PT RTRN 9FT ADLT (ELECTROSURGICAL) ×1 IMPLANT
GLOVE SURG SIGNA 7.5 PF LTX (GLOVE) ×2 IMPLANT
GOWN STRL REUS W/ TWL LRG LVL3 (GOWN DISPOSABLE) ×1 IMPLANT
GOWN STRL REUS W/ TWL XL LVL3 (GOWN DISPOSABLE) ×1 IMPLANT
GOWN STRL REUS W/TWL LRG LVL3 (GOWN DISPOSABLE) ×2
GOWN STRL REUS W/TWL XL LVL3 (GOWN DISPOSABLE) ×2
KIT BASIN OR (CUSTOM PROCEDURE TRAY) ×2 IMPLANT
KIT MARKER MARGIN INK (KITS) ×2 IMPLANT
NDL HYPO 25GX1X1/2 BEV (NEEDLE) ×1 IMPLANT
NEEDLE HYPO 25GX1X1/2 BEV (NEEDLE) ×2 IMPLANT
NS IRRIG 1000ML POUR BTL (IV SOLUTION) ×1 IMPLANT
PACK SURGICAL SETUP 50X90 (CUSTOM PROCEDURE TRAY) ×2 IMPLANT
PENCIL BUTTON HOLSTER BLD 10FT (ELECTRODE) ×2 IMPLANT
SPONGE LAP 18X18 X RAY DECT (DISPOSABLE) ×2 IMPLANT
SUT MNCRL AB 4-0 PS2 18 (SUTURE) ×2 IMPLANT
SUT VIC AB 3-0 SH 18 (SUTURE) ×2 IMPLANT
SYR BULB 3OZ (MISCELLANEOUS) ×2 IMPLANT
SYR CONTROL 10ML LL (SYRINGE) ×2 IMPLANT
TOWEL OR 17X24 6PK STRL BLUE (TOWEL DISPOSABLE) ×2 IMPLANT
TOWEL OR 17X26 10 PK STRL BLUE (TOWEL DISPOSABLE) ×2 IMPLANT
TUBE CONNECTING 12X1/4 (SUCTIONS) ×2 IMPLANT
YANKAUER SUCT BULB TIP NO VENT (SUCTIONS) ×2 IMPLANT

## 2016-11-19 NOTE — Anesthesia Postprocedure Evaluation (Signed)
Anesthesia Post Note  Patient: Kristin Braun  Procedure(s) Performed: LEFT BREAST LUMPECTOMY WITH RADIOACTIVE SEED LOCALIZATION ERAS PATHWAY (Left Breast)     Patient location during evaluation: PACU Anesthesia Type: General Level of consciousness: awake Pain management: pain level controlled Vital Signs Assessment: post-procedure vital signs reviewed and stable Respiratory status: spontaneous breathing Cardiovascular status: stable Postop Assessment: no apparent nausea or vomiting Anesthetic complications: no    Last Vitals:  Vitals:   11/19/16 1054 11/19/16 1110  BP: 106/62 108/77  Pulse: 73 79  Resp: 12 15  Temp: (!) 36.4 C   SpO2: 100% 100%    Last Pain:  Vitals:   11/19/16 1120  PainSc: 6    Pain Goal: Patients Stated Pain Goal: 2 (11/19/16 1120)               Keishla Oyer JR,JOHN Yakir Wenke

## 2016-11-19 NOTE — Anesthesia Procedure Notes (Cosign Needed)
Procedure Name: LMA Insertion Date/Time: 11/19/2016 10:13 AM Performed by: Lyn Hollingshead Pre-anesthesia Checklist: Patient identified, Suction available, Emergency Drugs available, Patient being monitored and Timeout performed Patient Re-evaluated:Patient Re-evaluated prior to induction Oxygen Delivery Method: Circle system utilized Preoxygenation: Pre-oxygenation with 100% oxygen Induction Type: IV induction Ventilation: Mask ventilation without difficulty LMA: LMA inserted LMA Size: 4.0 Number of attempts: 1 Dental Injury: Teeth and Oropharynx as per pre-operative assessment

## 2016-11-19 NOTE — Transfer of Care (Cosign Needed)
Immediate Anesthesia Transfer of Care Note  Patient: Kristin Braun  Procedure(s) Performed: LEFT BREAST LUMPECTOMY WITH RADIOACTIVE SEED LOCALIZATION ERAS PATHWAY (Left )  Patient Location: PACU  Anesthesia Type:General  Level of Consciousness: awake, alert , oriented and patient cooperative  Airway & Oxygen Therapy: Patient Spontanous Breathing and Patient connected to face mask oxygen  Post-op Assessment: Report given to RN, Post -op Vital signs reviewed and stable, Post -op Vital signs reviewed and unstable, Anesthesiologist notified and Patient moving all extremities X 4  Post vital signs: Reviewed and stable  Last Vitals:  Vitals:   11/19/16 0703 11/19/16 1054  BP: 133/90 106/62  Pulse: 78 73  Resp: 18 12  Temp: 36.6 C (!) 36.4 C  SpO2: 98% 100%    Last Pain:  Vitals:   11/19/16 0706  PainSc: 6       Patients Stated Pain Goal: 2 (72/90/21 1155)  Complications: No apparent anesthesia complications

## 2016-11-19 NOTE — Op Note (Signed)
LEFT BREAST LUMPECTOMY WITH RADIOACTIVE SEED LOCALIZATION ERAS PATHWAY  Procedure Note  Kristin Braun 11/19/2016   Pre-op Diagnosis: left breast mass     Post-op Diagnosis: same  Procedure(s): LEFT BREAST LUMPECTOMY WITH RADIOACTIVE SEED LOCALIZATION ERAS PATHWAY  Surgeon(s): Coralie Keens, MD  Anesthesia: General  Staff:  Circulator: Beryle Lathe, RN Scrub Person: Mariella Saa M  Estimated Blood Loss: Minimal               Specimens: sent to path  Indications: This is Braun 41 year old female with Braun complex sclerosing lesion of the left breast found on stereotactic biopsy. The decision was made to proceed to the operating for lumpectomy  Procedure: The patient was taken to the operating room and identified as the correct patient. She was placed supine on the operating table and general anesthesia was induced. Her left breast was then prepped and draped in the usual sterile fashion. Using the neoprobe, the radioactive seed was localized in the left breast. I anesthetized skin around the lateral edge of the areola and made Braun circumareolar incision with Braun scalpel. With the aid of the neoprobe I then tunneled down moving laterally towards the area of the radioactive seed. I was able to identify the seed and then perform Braun lumpectomy with electrocautery staying while the around the seed. Once the lumpectomy specimen was removed, I marked all margins with marker pain. An x-ray was performed on the specimen and the radioactive seed and previous marker were found to be in the specimen. The specimen was then sent to pathology for evaluation. I then achieved hemostasis in the lumpectomy cavity with the cautery. I injected further Marcaine into the incision. I then closed the subjacent tissue with interrupted 3-0 Vicryl sutures and closed the skin with Braun running 4-0 Monocryl. Dermabond was then applied. The patient tolerated procedure well. All the counts were correct at the end of the  procedure. The patient was then extubated in the operating room and taken in Braun stable condition to the recovery room.          Kristin Braun   Date: 11/19/2016  Time: 10:45 AM

## 2016-11-19 NOTE — Discharge Instructions (Signed)
[facility] °Office Phone Number [phone] ° °BREAST BIOPSY/ PARTIAL MASTECTOMY: POST OP INSTRUCTIONS ° °Always review your discharge instruction sheet given to you by the facility where your surgery was performed. ° °IF YOU HAVE DISABILITY OR FAMILY LEAVE FORMS, YOU MUST BRING THEM TO THE OFFICE FOR PROCESSING.  DO NOT GIVE THEM TO YOUR DOCTOR. ° °1. A prescription for pain medication may be given to you upon discharge.  Take your pain medication as prescribed, if needed.  If narcotic pain medicine is not needed, then you may take acetaminophen (Tylenol) or ibuprofen (Advil) as needed. °2. Take your usually prescribed medications unless otherwise directed °3. If you need a refill on your pain medication, please contact your pharmacy.  They will contact our office to request authorization.  Prescriptions will not be filled after 5pm or on week-ends. °4. You should eat very light the first 24 hours after surgery, such as soup, crackers, pudding, etc.  Resume your normal diet the day after surgery. °5. Most patients will experience some swelling and bruising in the breast.  Ice packs and a good support bra will help.  Swelling and bruising can take several days to resolve.  °6. It is common to experience some constipation if taking pain medication after surgery.  Increasing fluid intake and taking a stool softener will usually help or prevent this problem from occurring.  A mild laxative (Milk of Magnesia or Miralax) should be taken according to package directions if there are no bowel movements after 48 hours. °7. Unless discharge instructions indicate otherwise, you may remove your bandages 24-48 hours after surgery, and you may shower at that time.  You may have steri-strips (small skin tapes) in place directly over the incision.  These strips should be left on the skin for 7-10 days.  If your surgeon used skin glue on the incision, you may shower in 24 hours.  The glue will flake off over the  next 2-3 weeks.  Any sutures or staples will be removed at the office during your follow-up visit. °8. ACTIVITIES:  You may resume regular daily activities (gradually increasing) beginning the next day.  Wearing a good support bra or sports bra minimizes pain and swelling.  You may have sexual intercourse when it is comfortable. °a. You may drive when you no longer are taking prescription pain medication, you can comfortably wear a seatbelt, and you can safely maneuver your car and apply brakes. °b. RETURN TO WORK:  ______________________________________________________________________________________ °9. You should see your doctor in the office for a follow-up appointment approximately two weeks after your surgery.  Your doctor’s nurse will typically make your follow-up appointment when she calls you with your pathology report.  Expect your pathology report 2-3 business days after your surgery.  You may call to check if you do not hear from us after three days. °10. OTHER INSTRUCTIONS: _______________________________________________________________________________________________ _____________________________________________________________________________________________________________________________________ °_____________________________________________________________________________________________________________________________________ °_____________________________________________________________________________________________________________________________________ ° °WHEN TO CALL YOUR DOCTOR: °1. Fever over 101.0 °2. Nausea and/or vomiting. °3. Extreme swelling or bruising. °4. Continued bleeding from incision. °5. Increased pain, redness, or drainage from the incision. ° °The clinic staff is available to answer your questions during regular business hours.  Please don’t hesitate to call and ask to speak to one of the nurses for clinical concerns.  If you have a medical emergency, go to the nearest  emergency room or call 911.  A surgeon from [facility] is always on call at the hospital. ° °For further questions, please visit [email or web address]  °

## 2016-11-19 NOTE — Interval H&P Note (Signed)
History and Physical Interval Note: no change in H and P  11/19/2016 7:39 AM  Kristin Braun  has presented today for surgery, with the diagnosis of left breast mass  The various methods of treatment have been discussed with the patient and family. After consideration of risks, benefits and other options for treatment, the patient has consented to  Procedure(s): LEFT BREAST LUMPECTOMY WITH RADIOACTIVE SEED LOCALIZATION ERAS PATHWAY (Left) as a surgical intervention .  The patient's history has been reviewed, patient examined, no change in status, stable for surgery.  I have reviewed the patient's chart and labs.  Questions were answered to the patient's satisfaction.     Brittannie Tawney A

## 2016-11-19 NOTE — Anesthesia Preprocedure Evaluation (Signed)
Anesthesia Evaluation  Patient identified by MRN, date of birth, ID band Patient awake    Reviewed: Allergy & Precautions, NPO status , Patient's Chart, lab work & pertinent test results  Airway Mallampati: I       Dental  (+) Teeth Intact   Pulmonary    Pulmonary exam normal breath sounds clear to auscultation       Cardiovascular hypertension, Normal cardiovascular exam Rhythm:Regular Rate:Normal     Neuro/Psych    GI/Hepatic   Endo/Other    Renal/GU      Musculoskeletal   Abdominal Normal abdominal exam  (+)   Peds  Hematology   Anesthesia Other Findings   Reproductive/Obstetrics negative OB ROS                             Anesthesia Physical Anesthesia Plan  ASA: II  Anesthesia Plan: General   Post-op Pain Management:    Induction: Intravenous  PONV Risk Score and Plan: 3 and Ondansetron, Dexamethasone, Midazolam, Propofol infusion and Scopolamine patch - Pre-op  Airway Management Planned: LMA  Additional Equipment:   Intra-op Plan:   Post-operative Plan:   Informed Consent: I have reviewed the patients History and Physical, chart, labs and discussed the procedure including the risks, benefits and alternatives for the proposed anesthesia with the patient or authorized representative who has indicated his/her understanding and acceptance.   Dental advisory given  Plan Discussed with: CRNA and Surgeon  Anesthesia Plan Comments:         Anesthesia Quick Evaluation

## 2016-11-20 ENCOUNTER — Encounter (HOSPITAL_COMMUNITY): Payer: Self-pay | Admitting: Surgery

## 2016-11-20 NOTE — Telephone Encounter (Signed)
ERROR

## 2016-11-30 ENCOUNTER — Telehealth: Payer: Self-pay | Admitting: Genetics

## 2016-11-30 ENCOUNTER — Encounter: Payer: Self-pay | Admitting: Genetics

## 2016-11-30 NOTE — Telephone Encounter (Signed)
Genetic counseling appt has been scheduled for the pt to see Ria Comment on 10/30 at 10am. Pt aware to arrive 30 minutes early. Letter mailed.

## 2016-12-08 ENCOUNTER — Telehealth: Payer: Self-pay | Admitting: Neurology

## 2016-12-08 NOTE — Telephone Encounter (Signed)
Patient is calling to get clarification on CT scans dated 10-02-15 and 10-31-15.

## 2016-12-08 NOTE — Telephone Encounter (Signed)
Called patient back and she was wanting to discuss the findings on her previoud CT's done in 2017. There was some wording that she was questioning what it meant. I discussed and went over with her. Pt verbalized understanding and was appreciative for the call.

## 2016-12-15 ENCOUNTER — Ambulatory Visit (HOSPITAL_BASED_OUTPATIENT_CLINIC_OR_DEPARTMENT_OTHER): Payer: Self-pay | Admitting: Genetics

## 2016-12-15 ENCOUNTER — Other Ambulatory Visit: Payer: No Typology Code available for payment source

## 2016-12-15 ENCOUNTER — Encounter: Payer: Self-pay | Admitting: Genetics

## 2016-12-15 DIAGNOSIS — Z7183 Encounter for nonprocreative genetic counseling: Secondary | ICD-10-CM

## 2016-12-15 DIAGNOSIS — Z8041 Family history of malignant neoplasm of ovary: Secondary | ICD-10-CM

## 2016-12-15 DIAGNOSIS — Z801 Family history of malignant neoplasm of trachea, bronchus and lung: Secondary | ICD-10-CM | POA: Insufficient documentation

## 2016-12-15 DIAGNOSIS — N6022 Fibroadenosis of left breast: Secondary | ICD-10-CM | POA: Insufficient documentation

## 2016-12-15 DIAGNOSIS — Z800 Family history of malignant neoplasm of digestive organs: Secondary | ICD-10-CM

## 2016-12-15 HISTORY — DX: Family history of malignant neoplasm of ovary: Z80.41
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
## 2016-12-21 ENCOUNTER — Other Ambulatory Visit: Payer: Self-pay | Admitting: Neurology

## 2016-12-22 ENCOUNTER — Other Ambulatory Visit: Payer: Self-pay | Admitting: Neurology

## 2016-12-22 MED ORDER — BUTORPHANOL TARTRATE 10 MG/ML NA SOLN: 1.0000 | NASAL | 0 refills | Status: DC | PRN

## 2016-12-24 ENCOUNTER — Encounter: Payer: Self-pay | Admitting: Genetics

## 2016-12-24 ENCOUNTER — Telehealth: Payer: Self-pay | Admitting: Genetics

## 2016-12-24 ENCOUNTER — Ambulatory Visit: Payer: Self-pay | Admitting: Genetics

## 2016-12-24 DIAGNOSIS — Z8041 Family history of malignant neoplasm of ovary: Secondary | ICD-10-CM

## 2016-12-24 DIAGNOSIS — Z801 Family history of malignant neoplasm of trachea, bronchus and lung: Secondary | ICD-10-CM

## 2016-12-24 DIAGNOSIS — N6022 Fibroadenosis of left breast: Secondary | ICD-10-CM

## 2016-12-24 DIAGNOSIS — Z800 Family history of malignant neoplasm of digestive organs: Secondary | ICD-10-CM

## 2016-12-24 DIAGNOSIS — Z1379 Encounter for other screening for genetic and chromosomal anomalies: Secondary | ICD-10-CM

## 2016-12-24 NOTE — Telephone Encounter (Signed)
Revealed negative genetic testing. This normal result means that we did not identify a genetic variant that explains the family history or would indicate [name] is at an increased risk for a cancer associated with one of the 47 genes tested.  However genetic testing is not perfect, and cannot definitively rule out a hereditary cause for her family's cancer.  Due to the family history of ovarian cancer, I recommended that she speak to an OBGYN about the family history and if they would recommend an oophorectomy simply based on family history (no genetic factors to influence this decision).  I also recommended that her other maternal relavies have genetic testing as well because there could be a genetic cause for the cancer in her mother's family that [name] did not inherit and therefore was not found in her genetic testing.    It will be important for her to keep in contact with genetics to learn if any additional testing may be needed in the future and to inform us if any other relatives have genetic testing. I informed her that her Lilian Kapur breast cancer risk is estimated at about 19% this is not high enough to recommend additional screening above the general population (MRI,etc), but that her risk is somewhat elevated above the general population and she should be diligent regarding her annual breast screening.

## 2016-12-24 NOTE — Progress Notes (Signed)
HPI: [name] was previously seen in the [facility] on 12/17/2016 due to a family history of ovarian cancer and concerns regarding a hereditary predisposition to cancer. Please refer to our prior cancer genetics clinic note for more information regarding [name] medical, social and family histories, and our assessment and recommendations, at the time. [name] recent genetic test results were disclosed to her, as well as recommendations warranted by these results. These results and recommendations are discussed in more detail below.   FAMILY HISTORY:  We obtained a detailed, 4-generation family history.  Significant diagnoses are listed below: Family History  Problem Relation Age of Onset  . Ovarian cancer Mother 73       had hysterectomy  . Lung cancer Mother 29  . Diabetes Mellitus II Father   . Other Brother        bladder problem (bleeding), lung scarring  . Ovarian cancer Maternal Aunt 20       had hysterectomy  . Colon cancer Maternal Aunt 17       previously had polyps  . Ovarian cancer Maternal Grandmother 31       'some cells left benind' recurred at 41 and died at 2  . Lung cancer Paternal Grandfather 62       Asbestos exposure  . Colon polyps Cousin 75       had precancerous polyps identified in 69's   [name] has 2 sons ages 50 and 39 with no history of cancer.  [name] has 1 maternal half brother who is 48 with no history of cancer.  He has scarring in his lungs and pt reports he has some bladder problems (bleeding during urination).  He also has 5 children.  [name] has a paternal half-brother who is 63 and has no history of cancer.  He has no colon polyps as far as [name] is aware.  He has a daughter.   [name] father is 44 with no  History of cancer.  [name] has 1 paternal aunt who is 59 with no history of cancer.  She has a 37 year-old son and a 56 year old daughter.  This daughter (patient's cousin) had precancerous colon  polyps identified in her 64's.  [name] paternal grandfather died at 40 due to lung cancer.  He had exposure to Asbestos.  [name] grandmother is 68 with no history of cancer.   [name] mother was reportedly diagnosed with ovarian cancer in her 44's. [name] memory of this treatment is limited as she was a child, but she knows her mother had a hysterectomy.  Her mother developed lung cancer at 27 and died at the age of 11.  [name] reports that her mother had another issue with her breast, but does not think it was cancer (although is unsure).  Her mother also had lung scarring.   [name] has 3 maternal uncles and 1 maternal aunt listed below: -1 maternal aunt was diagnosed with ovarian cancer in her 64's and had a hysterectomy.  She was diagnosed with colon cancer in her 4's, had previous colon polyps, and died at the age of 25.  She had 1 daughter who is 77 with no history of cancer.  -1 maternal uncle died in his 18's with no history of cancer.  He had 1 daughter who is 39 with no history of cancer.  -1 maternal uncle is 79 with no history of cancer.  He  has 2 sons ages 77 and 12 with no cancer. -1 maternal uncle is in his 59's with no history of cancer and has no children.   [name] has limited information about her maternal grandfather.  She knows he died in his 11's/40's.  No known history of cancer.  [name] maternal grandmother was diagnosed with ovarian cancer in her 79's and had a hysterectomy.  She had ovarian cancer again at the age of 27 (reports that a few single cells were left behind after hysterectomy and this is where the 2nd cancer originated from).  She died at 90.    [name] is unaware of previous family history of genetic testing for hereditary cancer risks. Patient's maternal ancestors are of [place] descent, and paternal ancestors are of [place] descent. There is no reported Ashkenazi Jewish ancestry. There is no known  consanguinity.  GENETIC TEST RESULTS: Genetic testing performed through [facility]'s Common Hereditary Cancer Panel reported out on 12/23/2016 showed no pathogenic mutations. The Hereditary Gene Panel offered by [facility] includes sequencing and/or deletion duplication testing of the following 47 genes: APC, ATM, AXIN2, BARD1, BMPR1A, BRCA1, BRCA2, BRIP1, CDH1, CDKN2A (p14ARF), CDKN2A (p16INK4a), CKD4, CHEK2, CTNNA1, DICER1, EPCAM (Deletion/duplication testing only), GREM1 (promoter region deletion/duplication testing only), KIT, MEN1, MLH1, MSH2, MSH3, MSH6, MUTYH, NBN, NF1, NHTL1, PALB2, PDGFRA, PMS2, POLD1, POLE, PTEN, RAD50, RAD51C, RAD51D, SDHB, SDHC, SDHD, SMAD4, SMARCA4. STK11, TP53, TSC1, TSC2, and VHL.  The following genes were evaluated for sequence changes only: SDHA and HOXB13 c.251G>A variant only..  The test report will be scanned into EPIC and will be located under the Molecular Pathology section of the Results Review tab.A portion of the result report is included below for reference.     We discussed with [name] that because current genetic testing is not perfect, it is possible there may be a gene mutation in one of these genes that current testing cannot detect, but that chance is small. We also discussed, that there could be another gene that has not yet been discovered, or that we have not yet tested, that is responsible for the cancer diagnoses in the family. It is also possible that there is a hereditary cause for her family's cancer that she did not inherit, and therefore was not identified in her genetic testing.  Therefore, it is important to remain in touch with cancer genetics in the future so that we can continue to offer [name] the most up to date genetic testing.   ADDITIONAL GENETIC TESTING: We discussed with [name] that there are other genes that are associated with increased cancer risk that can be analyzed. The laboratories that offer this testing look at these  additional genes via a hereditary cancer gene panel. Should [name] wish to pursue additional genetic testing, we are happy to discuss and coordinate this testing, at any time.    CANCER SCREENING RECOMMENDATIONS: This negative result means that we were unable to identify a hereditary cause for her family history of cancer at this time.  While reassuring, this result does rule out that there is a hereditary basis for her family's cancer.  It is still possible that there could be genetic mutations that are undetectable by current technology, or genetic mutations in genes that have not been tested or identified to increase cancer risk. Therefore, [name] was advised to continue following the cancer screening guidelines provided by her primary healthcare providers. Other factors such as her personal and family history may  still affect her cancer risk.     While there were no variants to suggest increased cancer risk in her genetic test results, her family history of ovarian cancer in her mother, maternal aunt, and maternal grandmother does increase her risk to develop ovarian cancer.  We also discussed that a transvaginal ultrasound and a CA-125 blood test can be considered for ovarian cancer screening, although these tests have not been shown to be effective in detecting this cancer early.  Thus, some women at increased risk of ovarian cancer choose to undergo removal of the ovaries and fallopian tubes. We recommended [name] discuss these options further with her gynecologist, oncology and/or primary providers.  Based on the patient's personal and family history, the statistical model, Lilian Kapur, was used to estimate her risk of developing breast cancer. This estimates her lifetime risk of developing breast cancer to be approximately 19.0%. The patient's lifetime breast cancer risk is a preliminary estimate based on available information using one of several models endorsed by the [place] (ACS). The ACS recommends consideration of breast MRI screening as an adjunct to mammography for patients at high risk (defined as 20% or greater lifetime risk). A more detailed breast cancer risk assessment can be considered, if clinically indicated.  [name] risk is somewhat increased above the general population, however, it is not above the ACS threshold to recommend annual breast MRI. She should continue to to have annual breast screening as recommended by her doctors.        [name] should continue to follow the recommendations provided to her for colon screening by her gastroenterologist.   RECOMMENDATIONS FOR FAMILY MEMBERS: Women in this family might be at some increased risk of developing cancer, over the general population risk, simply due to the family history of cancer. We recommended women in this family have a yearly mammogram beginning at age 22, or 18 years younger than the earliest onset of cancer, an annual clinical breast exam, and perform monthly breast self-exams. Women in this family should also have a gynecological exam as recommended by their primary provider. All family members should have a colonoscopy by age 36 (or earlier depending on family history).  Based on [name] family history, we recommended her maternal relatives, who are related to the individuals with ovarian cancer, have genetic counseling and testing.  There could be a hereditary cause for the cancer that [name] did not inherit but that could be present in other relatives.   [name] will let us know if we can be of any assistance in coordinating genetic counseling and/or testing for these family members.  We encouraged her to let us know if any relatives to get genetic testing and inform us of their results as this could change our risk assessment for her.    FOLLOW-UP: Lastly, we discussed with [name] that cancer genetics is a rapidly advancing field and it is possible that new  genetic tests will be appropriate for her and/or her family members in the future. We encouraged her to remain in contact with cancer genetics on an annual basis so we can update her personal and family histories and let her know of advances in cancer genetics that may benefit this family.   Our contact number was provided. [name] questions were answered to her satisfaction, and she knows she is welcome to call us at anytime with additional questions or concerns.   Ferol Luz, MS Genetic Counselor [email or web address]

## 2016-12-29 ENCOUNTER — Encounter (HOSPITAL_COMMUNITY): Payer: Self-pay | Admitting: Emergency Medicine

## 2016-12-29 ENCOUNTER — Other Ambulatory Visit: Payer: Self-pay

## 2016-12-29 DIAGNOSIS — Z79899 Other long term (current) drug therapy: Secondary | ICD-10-CM | POA: Insufficient documentation

## 2016-12-29 DIAGNOSIS — R1084 Generalized abdominal pain: Secondary | ICD-10-CM | POA: Insufficient documentation

## 2016-12-29 DIAGNOSIS — I10 Essential (primary) hypertension: Secondary | ICD-10-CM | POA: Insufficient documentation

## 2016-12-29 DIAGNOSIS — R197 Diarrhea, unspecified: Secondary | ICD-10-CM | POA: Insufficient documentation

## 2016-12-29 LAB — CBC
HEMATOCRIT: 46.4 % — AB (ref 36.0–46.0)
HEMOGLOBIN: 16.3 g/dL — AB (ref 12.0–15.0)
MCH: 31.5 pg (ref 26.0–34.0)
MCHC: 35.1 g/dL (ref 30.0–36.0)
MCV: 89.6 fL (ref 78.0–100.0)
Platelets: 235 10*3/uL (ref 150–400)
RBC: 5.18 MIL/uL — AB (ref 3.87–5.11)
RDW: 12.3 % (ref 11.5–15.5)
WBC: 10.5 10*3/uL (ref 4.0–10.5)

## 2016-12-29 LAB — COMPREHENSIVE METABOLIC PANEL
ALT: 26 U/L (ref 14–54)
AST: 25 U/L (ref 15–41)
Albumin: 4 g/dL (ref 3.5–5.0)
Alkaline Phosphatase: 57 U/L (ref 38–126)
Anion gap: 7 (ref 5–15)
BUN: 11 mg/dL (ref 6–20)
CHLORIDE: 110 mmol/L (ref 101–111)
CO2: 19 mmol/L — ABNORMAL LOW (ref 22–32)
Calcium: 9.3 mg/dL (ref 8.9–10.3)
Creatinine, Ser: 0.86 mg/dL (ref 0.44–1.00)
Glucose, Bld: 110 mg/dL — ABNORMAL HIGH (ref 65–99)
POTASSIUM: 3.6 mmol/L (ref 3.5–5.1)
SODIUM: 136 mmol/L (ref 135–145)
Total Bilirubin: 0.7 mg/dL (ref 0.3–1.2)
Total Protein: 6.8 g/dL (ref 6.5–8.1)

## 2016-12-29 LAB — URINALYSIS, ROUTINE W REFLEX MICROSCOPIC
Bilirubin Urine: NEGATIVE
GLUCOSE, UA: NEGATIVE mg/dL
Hgb urine dipstick: NEGATIVE
Ketones, ur: NEGATIVE mg/dL
LEUKOCYTES UA: NEGATIVE
Nitrite: NEGATIVE
PH: 6 (ref 5.0–8.0)
PROTEIN: NEGATIVE mg/dL
SPECIFIC GRAVITY, URINE: 1.019 (ref 1.005–1.030)

## 2016-12-29 LAB — I-STAT BETA HCG BLOOD, ED (MC, WL, AP ONLY): I-stat hCG, quantitative: <5 m[IU]/mL (ref <5)

## 2016-12-29 LAB — LIPASE, BLOOD: Lipase: 33 U/L (ref 11–51)

## 2016-12-29 NOTE — ED Triage Notes (Signed)
Pt presents to ED for assessment of lower/mid abdominal pain, radiating to right flank x 2 weeks, gradually worsening with time.  Pt c/o loose stools, denies changes in urination, c/o vomiting with "almost every meal".  Pt states tonight she experienced a very sharp RUQ pain which was shooting in nature and worsened her back pain.

## 2016-12-30 ENCOUNTER — Emergency Department (HOSPITAL_COMMUNITY)
Admission: EM | Admit: 2016-12-30 | Discharge: 2016-12-30 | Disposition: A | Discharge status: Home / Self Care | Payer: Self-pay | Attending: Emergency Medicine | Admitting: Emergency Medicine

## 2016-12-30 ENCOUNTER — Emergency Department (HOSPITAL_COMMUNITY): Payer: Self-pay

## 2016-12-30 DIAGNOSIS — R197 Diarrhea, unspecified: Secondary | ICD-10-CM

## 2016-12-30 DIAGNOSIS — R1084 Generalized abdominal pain: Secondary | ICD-10-CM

## 2016-12-30 LAB — POC OCCULT BLOOD, ED: Fecal Occult Bld: NEGATIVE

## 2016-12-30 MED ORDER — ONDANSETRON 4 MG PO TBDP: ORAL_TABLET | ORAL | 0 refills | Status: DC

## 2016-12-30 MED ORDER — IOPAMIDOL (ISOVUE-300) INJECTION 61%
INTRAVENOUS | Status: AC
  Administered 2016-12-30: 100 mL
  Filled 2016-12-30: qty 100

## 2016-12-30 MED ORDER — SODIUM CHLORIDE 0.9 % IV BOLUS (SEPSIS)
1000.0000 mL | Freq: Once | INTRAVENOUS | Status: AC
  Administered 2016-12-30: 1000 mL via INTRAVENOUS

## 2016-12-30 MED ORDER — HYDROMORPHONE HCL 1 MG/ML IJ SOLN
1.0000 mg | Freq: Once | INTRAMUSCULAR | Status: AC
  Administered 2016-12-30: 1 mg via INTRAVENOUS
  Filled 2016-12-30: qty 1

## 2016-12-30 MED ORDER — ONDANSETRON HCL 4 MG/2ML IJ SOLN
4.0000 mg | Freq: Once | INTRAMUSCULAR | Status: AC
  Administered 2016-12-30: 4 mg via INTRAVENOUS
  Filled 2016-12-30: qty 2

## 2016-12-30 NOTE — Discharge Instructions (Signed)
Please take your meds as prescribed by your doctor.   Take zofran as needed for nausea   See your neurologist and primary care doctor.   Consider calling [facility] for follow up for your abdominal pain and diarrhea   Return to ER if you have worse abdominal pain, vomiting, dehydration.

## 2016-12-30 NOTE — ED Notes (Signed)
Patient transported to CT 

## 2016-12-30 NOTE — ED Provider Notes (Signed)
TIME SEEN: 5:55 AM  CHIEF COMPLAINT: Abdominal pain  HPI: Patient is a 41 year old female with history of fibromyalgia, migraines, seizures not currently on medications (but states that her neurologist that go for neurology is aware) who presents emergency department with complaints of abdominal pain.  States that she has had a cramping, dull, constant ache to her lower abdomen for 2 weeks with diarrhea.  States at times that her diarrhea appears dark and even black with some "orange mucus".  She has had difficulty keeping anything down as she states she is having nausea and vomiting.  States that earlier this week she began having upper abdominal pain that she describes as an intermittent stabbing pain.  She has had previous laparoscopic surgery to evaluate for a ectopic pregnancy and a previous BTL.  Has never had similar symptoms.  No bright red blood in her stool.  No vaginal bleeding dysuria or hematuria.  Reports over the past several weeks she has had irregular menstrual cycles.  ROS: See HPI Constitutional: no fever  Eyes: no drainage  ENT: no runny nose   Cardiovascular:  no chest pain  Resp: no SOB  GI: no vomiting GU: no dysuria Integumentary: no rash  Allergy: no hives  Musculoskeletal: no leg swelling  Neurological: no slurred speech ROS otherwise negative  PAST MEDICAL HISTORY/PAST SURGICAL HISTORY:  Past Medical History:  Diagnosis Date  . Anxiety   . Bipolar 1 disorder ([place])    [facility]- Dr. [name].- sees him q 6-8 weeks  . 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 reports that stress flares the seizure activity   . Family history of colon cancer   . Family history of lung cancer   . Family history of ovarian cancer 12/15/2016  . Fibromyalgia   . GERD (gastroesophageal reflux disease)   . Headache    migraines   . History of hiatal hernia   . History of kidney stones    passed spontaneously  . Hypertension    showing ^ BP, pt. relates to  anxiety, reports that she has never had tx for BP or any heart related problems.   . Osteoarthritis    everywhere- - hips & hands   . Osteoporosis   . PTSD (post-traumatic stress disorder)   . Sclerosing adenosis of breast, left   . Seizures ([place])    last seizure 10-12-16, petit mal, Dr Roddie Mc aware  . Sleep apnea   . TBI (traumatic brain injury) ([place])    plate on L side of head   . Thyroid disease     MEDICATIONS:  Prior to Admission medications   Medication Sig Start Date End Date Taking? Authorizing Provider  busPIRone (BUSPAR) 15 MG tablet Take 15 mg by mouth 3 (three) times daily.     [provider]  butorphanol (STADOL) 10 MG/ML nasal spray Place 1 spray every 4 (four) hours as needed into the nose for headache. 12/22/16   Dohmeier, Asencion Partridge, MD  calcium carbonate (TUMS EX) 750 MG chewable tablet Chew 2 tablets by mouth 3 (three) times daily.    [provider]  divalproex (DEPAKOTE ER) 250 MG 24 hr tablet Take 3 tablets (750 mg total) by mouth 2 (two) times daily. Patient taking differently: Take 250 mg by mouth 2 (two) times daily.  08/26/16   Dohmeier, Asencion Partridge, MD  ethosuximide (ZARONTIN) 250 MG capsule Take 1 capsule (250 mg total) by mouth 2 (two) times daily. 08/26/16   Dohmeier, Asencion Partridge,  MD  lurasidone (LATUDA) 40 MG TABS tablet Take 40 mg by mouth daily with supper.     [provider]  oxyCODONE (OXY IR/ROXICODONE) 5 MG immediate release tablet Take 1-2 tablets (5-10 mg total) by mouth every 6 (six) hours as needed for moderate pain, severe pain or breakthrough pain. 11/19/16   Coralie Keens, MD  promethazine (PHENERGAN) 50 MG tablet Take 50 mg by mouth every 8 (eight) hours as needed for nausea or vomiting.    [provider]  topiramate (TOPAMAX) 50 MG tablet Take 1 tablet (50 mg total) by mouth 2 (two) times daily. 08/26/16   Dohmeier, Asencion Partridge, MD  traZODone (DESYREL) 100 MG tablet Take 200 mg by mouth at bedtime.  06/27/15   [provider]  venlafaxine XR (EFFEXOR-XR) 75 MG 24 hr capsule Take 75 mg by mouth daily with breakfast.    [provider]    ALLERGIES:  Allergies  Allergen Reactions  . Penicillins     UNSPECIFIED REACTION   . Morphine And Related Rash    SOCIAL HISTORY:  Social History   Tobacco Use  . Smoking status: Never Smoker  . Smokeless tobacco: Never Used  Substance Use Topics  . Alcohol use: Yes    Alcohol/week: 0.6 oz    Types: 1 Standard drinks or equivalent per week    Comment: 1 per day    FAMILY HISTORY: Family History  Problem Relation Age of Onset  . Ovarian cancer Mother 41       had hysterectomy  . Lung cancer Mother 79  . Diabetes Mellitus II Father   . Other Brother        bladder problem (bleeding), lung scarring  . Ovarian cancer Maternal Aunt 20       had hysterectomy  . Colon cancer Maternal Aunt 66       previously had polyps  . Ovarian cancer Maternal Grandmother 5       'some cells left benind' recurred at 58 and died at 79  . Lung cancer Paternal Grandfather 57       Asbestos exposure  . Colon polyps Cousin 35       had precancerous polyps identified in 30's    EXAM: BP (!) 127/98 (BP Location: Left Arm)   Pulse 92   Temp 99.1 F (37.3 C) (Oral)   Resp 16   SpO2 98%  CONSTITUTIONAL: Alert and oriented and responds appropriately to questions. Well-appearing; well-nourished, afebrile, appears uncomfortable with movement in the bed HEAD: Normocephalic EYES: Conjunctivae clear, pupils appear equal, EOMI ENT: normal nose; moist mucous membranes NECK: Supple, no meningismus, no nuchal rigidity, no LAD  CARD: RRR; S1 and S2 appreciated; no murmurs, no clicks, no rubs, no gallops RESP: Normal chest excursion without splinting or tachypnea; breath sounds clear and equal bilaterally; no wheezes, no rhonchi, no rales, no hypoxia or respiratory distress, speaking full sentences ABD/GI: Normal bowel sounds; non-distended; soft, tender to  palpation diffusely, no rebound, no guarding, no peritoneal signs, no hepatosplenomegaly RECTAL:  Normal rectal tone, no gross blood or melena, guaiac negative, patient has one nonthrombosed nonbleeding external hemorrhoid on exam, nontender rectal exam, no fecal impaction  BACK:  The back appears normal and is non-tender to palpation, there is no CVA tenderness EXT: Normal ROM in all joints; non-tender to palpation; no edema; normal capillary refill; no cyanosis, no calf tenderness or swelling    SKIN: Normal color for age and race; warm; no rash NEURO: Moves all  extremities equally PSYCH: The patient's mood and manner are appropriate. Grooming and personal hygiene are appropriate.  MEDICAL DECISION MAKING: Patient here with diffuse abdominal pain, diarrhea, nausea and vomiting.  Differential diagnosis includes colitis, diverticulitis, appendicitis, bowel obstruction, cholelithiasis or cholecystitis.  Labs unremarkable.  Normal LFTs, lipase.  No leukocytosis.  Pregnancy test negative.  She is guaiac negative.  Urine shows no sign of dehydration or infection.  Will give IV fluids, pain and nausea medicine for symptom medic relief.  Will obtain CT of her abdomen and pelvis for further evaluation.  ED PROGRESS: 7:15 AM  Signed out to Dr. Darl Householder to follow-up on patient CT scan.   I reviewed all nursing notes, vitals, pertinent previous records, EKGs, lab and urine results, imaging (as available).      Kristin Braun, Delice Bison, [place] 12/30/16 [phone]

## 2016-12-30 NOTE — ED Provider Notes (Signed)
  Physical Exam  BP 109/84 (BP Location: Left Arm)   Pulse 89   Temp 99.1 F (37.3 C) (Oral)   Resp 16   SpO2 96%   Physical Exam  ED Course  Procedures  MDM Patient care assumed at 7 am. Patient hx of fibromyalgia, here with abdominal pain, diarrhea for several week. Labs unremarkable. Sign out pending CT ab/pel, which is unremarkable. Patient states that "there must be something wrong". I recommend GI outpatient follow up for possible IBS.    Drenda Freeze, MD 12/30/16 [phone]

## 2017-01-06 ENCOUNTER — Encounter (HOSPITAL_COMMUNITY): Payer: Self-pay | Admitting: *Deleted

## 2017-01-14 ENCOUNTER — Encounter (HOSPITAL_COMMUNITY): Payer: Self-pay | Admitting: Emergency Medicine

## 2017-01-14 ENCOUNTER — Emergency Department (HOSPITAL_COMMUNITY)
Admission: EM | Admit: 2017-01-14 | Discharge: 2017-01-15 | Disposition: A | Discharge status: Home / Self Care | Payer: No Typology Code available for payment source | Attending: Emergency Medicine | Admitting: Emergency Medicine

## 2017-01-14 DIAGNOSIS — Y998 Other external cause status: Secondary | ICD-10-CM | POA: Insufficient documentation

## 2017-01-14 DIAGNOSIS — F1410 Cocaine abuse, uncomplicated: Secondary | ICD-10-CM | POA: Insufficient documentation

## 2017-01-14 DIAGNOSIS — R079 Chest pain, unspecified: Secondary | ICD-10-CM | POA: Insufficient documentation

## 2017-01-14 DIAGNOSIS — Z79899 Other long term (current) drug therapy: Secondary | ICD-10-CM | POA: Insufficient documentation

## 2017-01-14 DIAGNOSIS — R55 Syncope and collapse: Secondary | ICD-10-CM

## 2017-01-14 DIAGNOSIS — Y92012 Bathroom of single-family (private) house as the place of occurrence of the external cause: Secondary | ICD-10-CM | POA: Insufficient documentation

## 2017-01-14 DIAGNOSIS — R1011 Right upper quadrant pain: Secondary | ICD-10-CM | POA: Insufficient documentation

## 2017-01-14 DIAGNOSIS — I10 Essential (primary) hypertension: Secondary | ICD-10-CM | POA: Insufficient documentation

## 2017-01-14 DIAGNOSIS — R197 Diarrhea, unspecified: Secondary | ICD-10-CM | POA: Insufficient documentation

## 2017-01-14 DIAGNOSIS — E079 Disorder of thyroid, unspecified: Secondary | ICD-10-CM | POA: Insufficient documentation

## 2017-01-14 DIAGNOSIS — W19XXXA Unspecified fall, initial encounter: Secondary | ICD-10-CM | POA: Insufficient documentation

## 2017-01-14 DIAGNOSIS — E860 Dehydration: Secondary | ICD-10-CM | POA: Insufficient documentation

## 2017-01-14 DIAGNOSIS — S0083XA Contusion of other part of head, initial encounter: Secondary | ICD-10-CM | POA: Insufficient documentation

## 2017-01-14 DIAGNOSIS — Y9389 Activity, other specified: Secondary | ICD-10-CM | POA: Insufficient documentation

## 2017-01-14 DIAGNOSIS — R569 Unspecified convulsions: Secondary | ICD-10-CM | POA: Insufficient documentation

## 2017-01-14 LAB — CBC WITH DIFFERENTIAL/PLATELET
BASOS ABS: 0 10*3/uL (ref 0.0–0.1)
Basophils Relative: 0 %
EOS PCT: 3 %
Eosinophils Absolute: 0.2 10*3/uL (ref 0.0–0.7)
HEMATOCRIT: 48 % — AB (ref 36.0–46.0)
HEMOGLOBIN: 16.1 g/dL — AB (ref 12.0–15.0)
LYMPHS PCT: 41 %
Lymphs Abs: 3.8 10*3/uL (ref 0.7–4.0)
MCH: 30.5 pg (ref 26.0–34.0)
MCHC: 33.5 g/dL (ref 30.0–36.0)
MCV: 90.9 fL (ref 78.0–100.0)
MONO ABS: 0.4 10*3/uL (ref 0.1–1.0)
Monocytes Relative: 4 %
Neutro Abs: 4.9 10*3/uL (ref 1.7–7.7)
Neutrophils Relative %: 52 %
Platelets: 241 10*3/uL (ref 150–400)
RBC: 5.28 MIL/uL — AB (ref 3.87–5.11)
RDW: 12.3 % (ref 11.5–15.5)
WBC: 9.4 10*3/uL (ref 4.0–10.5)

## 2017-01-14 MED ORDER — SODIUM CHLORIDE 0.9 % IV BOLUS (SEPSIS)
1000.0000 mL | Freq: Once | INTRAVENOUS | Status: AC
  Administered 2017-01-14: 1000 mL via INTRAVENOUS

## 2017-01-14 MED ORDER — SODIUM CHLORIDE 0.9 % IV BOLUS (SEPSIS): 500.0000 mL | Freq: Once | INTRAVENOUS | Status: DC

## 2017-01-14 NOTE — ED Triage Notes (Signed)
Pt states she was getting up to go to the bathroom when she passed out and hit her head. Pt states after she stood from the chair the last thing she remember is "being back in the chair." Pts father states the son found her on the ground. Pt states she has been nauseas but no vomiting.

## 2017-01-15 LAB — URINALYSIS, ROUTINE W REFLEX MICROSCOPIC
BILIRUBIN URINE: NEGATIVE
GLUCOSE, UA: NEGATIVE mg/dL
HGB URINE DIPSTICK: NEGATIVE
Ketones, ur: NEGATIVE mg/dL
Leukocytes, UA: NEGATIVE
Nitrite: NEGATIVE
PROTEIN: NEGATIVE mg/dL
SPECIFIC GRAVITY, URINE: 1.015 (ref 1.005–1.030)
pH: 5 (ref 5.0–8.0)

## 2017-01-15 LAB — COMPREHENSIVE METABOLIC PANEL
ALK PHOS: 80 U/L (ref 38–126)
ALT: 18 U/L (ref 14–54)
ANION GAP: 8 (ref 5–15)
AST: 21 U/L (ref 15–41)
Albumin: 4.7 g/dL (ref 3.5–5.0)
BILIRUBIN TOTAL: 0.3 mg/dL (ref 0.3–1.2)
BUN: 10 mg/dL (ref 6–20)
CALCIUM: 9.8 mg/dL (ref 8.9–10.3)
CO2: 24 mmol/L (ref 22–32)
CREATININE: 0.69 mg/dL (ref 0.44–1.00)
Chloride: 105 mmol/L (ref 101–111)
GFR calc non Af Amer: >60 mL/min (ref >60)
Glucose, Bld: 82 mg/dL (ref 65–99)
Potassium: 3.5 mmol/L (ref 3.5–5.1)
Sodium: 137 mmol/L (ref 135–145)
TOTAL PROTEIN: 8.2 g/dL — AB (ref 6.5–8.1)

## 2017-01-15 LAB — TROPONIN I

## 2017-01-15 LAB — VALPROIC ACID LEVEL: Valproic Acid Lvl: <10 ug/mL — ABNORMAL LOW (ref 50.0–100.0)

## 2017-01-15 MED ORDER — VALPROATE SODIUM 500 MG/5ML IV SOLN
INTRAVENOUS | Status: AC
  Filled 2017-01-15: qty 10

## 2017-01-15 MED ORDER — VALPROATE SODIUM 500 MG/5ML IV SOLN
1000.0000 mg | Freq: Once | INTRAVENOUS | Status: AC
  Administered 2017-01-15: 1000 mg via INTRAVENOUS
  Filled 2017-01-15: qty 10

## 2017-01-15 NOTE — Discharge Instructions (Signed)
Continue your medications. Drink more fluids. You can take acetaminophen for headache as needed. Ice packs on your forehead may help. Return to the ED for any problems listed on the head injury sheet.
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
## 2017-01-19 ENCOUNTER — Other Ambulatory Visit: Payer: Self-pay | Admitting: Neurology

## 2017-01-19 MED ORDER — BUTORPHANOL TARTRATE 10 MG/ML NA SOLN: 1.0000 | NASAL | 0 refills | Status: DC | PRN

## 2017-01-20 ENCOUNTER — Encounter (HOSPITAL_COMMUNITY): Payer: Self-pay | Admitting: *Deleted

## 2017-01-20 ENCOUNTER — Inpatient Hospital Stay (HOSPITAL_COMMUNITY)
Admission: EM | Admit: 2017-01-20 | Discharge: 2017-01-21 | DRG: 918 | Disposition: A | Discharge status: Home / Self Care | Payer: Self-pay | Attending: Internal Medicine | Admitting: Internal Medicine

## 2017-01-20 ENCOUNTER — Other Ambulatory Visit: Payer: Self-pay

## 2017-01-20 ENCOUNTER — Inpatient Hospital Stay (HOSPITAL_COMMUNITY)
Admit: 2017-01-20 | Discharge: 2017-01-20 | Disposition: A | Discharge status: Home / Self Care | Payer: Self-pay | Attending: Internal Medicine | Admitting: Internal Medicine

## 2017-01-20 ENCOUNTER — Observation Stay (HOSPITAL_COMMUNITY): Payer: Self-pay

## 2017-01-20 ENCOUNTER — Emergency Department (HOSPITAL_COMMUNITY): Payer: Self-pay

## 2017-01-20 DIAGNOSIS — R55 Syncope and collapse: Secondary | ICD-10-CM

## 2017-01-20 DIAGNOSIS — Z9114 Patient's other noncompliance with medication regimen: Secondary | ICD-10-CM

## 2017-01-20 DIAGNOSIS — Z8041 Family history of malignant neoplasm of ovary: Secondary | ICD-10-CM

## 2017-01-20 DIAGNOSIS — IMO0002 Reserved for concepts with insufficient information to code with codable children: Secondary | ICD-10-CM | POA: Diagnosis present

## 2017-01-20 DIAGNOSIS — T426X4A Poisoning by other antiepileptic and sedative-hypnotic drugs, undetermined, initial encounter: Secondary | ICD-10-CM

## 2017-01-20 DIAGNOSIS — G43909 Migraine, unspecified, not intractable, without status migrainosus: Secondary | ICD-10-CM | POA: Diagnosis present

## 2017-01-20 DIAGNOSIS — G43111 Migraine with aura, intractable, with status migrainosus: Secondary | ICD-10-CM

## 2017-01-20 DIAGNOSIS — G40909 Epilepsy, unspecified, not intractable, without status epilepticus: Secondary | ICD-10-CM | POA: Diagnosis present

## 2017-01-20 DIAGNOSIS — R7989 Other specified abnormal findings of blood chemistry: Secondary | ICD-10-CM

## 2017-01-20 DIAGNOSIS — R569 Unspecified convulsions: Secondary | ICD-10-CM

## 2017-01-20 DIAGNOSIS — Z800 Family history of malignant neoplasm of digestive organs: Secondary | ICD-10-CM

## 2017-01-20 DIAGNOSIS — Z801 Family history of malignant neoplasm of trachea, bronchus and lung: Secondary | ICD-10-CM

## 2017-01-20 DIAGNOSIS — E860 Dehydration: Secondary | ICD-10-CM

## 2017-01-20 DIAGNOSIS — Y92009 Unspecified place in unspecified non-institutional (private) residence as the place of occurrence of the external cause: Secondary | ICD-10-CM

## 2017-01-20 DIAGNOSIS — Z833 Family history of diabetes mellitus: Secondary | ICD-10-CM

## 2017-01-20 DIAGNOSIS — T426X1A Poisoning by other antiepileptic and sedative-hypnotic drugs, accidental (unintentional), initial encounter: Principal | ICD-10-CM | POA: Diagnosis present

## 2017-01-20 DIAGNOSIS — K529 Noninfective gastroenteritis and colitis, unspecified: Secondary | ICD-10-CM | POA: Diagnosis present

## 2017-01-20 DIAGNOSIS — G43709 Chronic migraine without aura, not intractable, without status migrainosus: Secondary | ICD-10-CM

## 2017-01-20 DIAGNOSIS — K219 Gastro-esophageal reflux disease without esophagitis: Secondary | ICD-10-CM | POA: Diagnosis present

## 2017-01-20 DIAGNOSIS — M810 Age-related osteoporosis without current pathological fracture: Secondary | ICD-10-CM | POA: Diagnosis present

## 2017-01-20 DIAGNOSIS — E7220 Disorder of urea cycle metabolism, unspecified: Secondary | ICD-10-CM | POA: Diagnosis present

## 2017-01-20 DIAGNOSIS — R197 Diarrhea, unspecified: Secondary | ICD-10-CM

## 2017-01-20 DIAGNOSIS — Z87820 Personal history of traumatic brain injury: Secondary | ICD-10-CM

## 2017-01-20 DIAGNOSIS — M797 Fibromyalgia: Secondary | ICD-10-CM | POA: Diagnosis present

## 2017-01-20 DIAGNOSIS — S062X4S Diffuse traumatic brain injury with loss of consciousness of 6 hours to 24 hours, sequela: Secondary | ICD-10-CM

## 2017-01-20 DIAGNOSIS — F319 Bipolar disorder, unspecified: Secondary | ICD-10-CM | POA: Diagnosis present

## 2017-01-20 DIAGNOSIS — T426X4D Poisoning by other antiepileptic and sedative-hypnotic drugs, undetermined, subsequent encounter: Secondary | ICD-10-CM

## 2017-01-20 DIAGNOSIS — T4271XA Poisoning by unspecified antiepileptic and sedative-hypnotic drugs, accidental (unintentional), initial encounter: Secondary | ICD-10-CM

## 2017-01-20 LAB — CBC WITH DIFFERENTIAL/PLATELET
BASOS ABS: 0 10*3/uL (ref 0.0–0.1)
Basophils Relative: 0 %
EOS PCT: 1 %
Eosinophils Absolute: 0.1 10*3/uL (ref 0.0–0.7)
HCT: 50.1 % — ABNORMAL HIGH (ref 36.0–46.0)
Hemoglobin: 16.7 g/dL — ABNORMAL HIGH (ref 12.0–15.0)
LYMPHS PCT: 54 %
Lymphs Abs: 4.6 10*3/uL — ABNORMAL HIGH (ref 0.7–4.0)
MCH: 30.8 pg (ref 26.0–34.0)
MCHC: 33.3 g/dL (ref 30.0–36.0)
MCV: 92.3 fL (ref 78.0–100.0)
MONO ABS: 0.2 10*3/uL (ref 0.1–1.0)
Monocytes Relative: 2 %
Neutro Abs: 3.7 10*3/uL (ref 1.7–7.7)
Neutrophils Relative %: 43 %
PLATELETS: 231 10*3/uL (ref 150–400)
RBC: 5.43 MIL/uL — ABNORMAL HIGH (ref 3.87–5.11)
RDW: 12.6 % (ref 11.5–15.5)
WBC: 8.7 10*3/uL (ref 4.0–10.5)

## 2017-01-20 LAB — ACETAMINOPHEN LEVEL

## 2017-01-20 LAB — COMPREHENSIVE METABOLIC PANEL
ALBUMIN: 4.7 g/dL (ref 3.5–5.0)
ALBUMIN: 3.3 g/dL — AB (ref 3.5–5.0)
ALK PHOS: 65 U/L (ref 38–126)
ALK PHOS: 45 U/L (ref 38–126)
ALT: 33 U/L (ref 14–54)
ALT: 25 U/L (ref 14–54)
AST: 35 U/L (ref 15–41)
AST: 27 U/L (ref 15–41)
Anion gap: 14 (ref 5–15)
Anion gap: 6 (ref 5–15)
BILIRUBIN TOTAL: 0.5 mg/dL (ref 0.3–1.2)
BILIRUBIN TOTAL: 0.4 mg/dL (ref 0.3–1.2)
BUN: 10 mg/dL (ref 6–20)
BUN: 8 mg/dL (ref 6–20)
CALCIUM: 7.7 mg/dL — AB (ref 8.9–10.3)
CO2: 22 mmol/L (ref 22–32)
CO2: 20 mmol/L — ABNORMAL LOW (ref 22–32)
Calcium: 9.7 mg/dL (ref 8.9–10.3)
Chloride: 104 mmol/L (ref 101–111)
Chloride: 113 mmol/L — ABNORMAL HIGH (ref 101–111)
Creatinine, Ser: 0.96 mg/dL (ref 0.44–1.00)
Creatinine, Ser: 0.86 mg/dL (ref 0.44–1.00)
GFR calc Af Amer: >60 mL/min (ref >60)
GFR calc Af Amer: >60 mL/min (ref >60)
GFR calc non Af Amer: >60 mL/min (ref >60)
GFR calc non Af Amer: >60 mL/min (ref >60)
GLUCOSE: 88 mg/dL (ref 65–99)
GLUCOSE: 82 mg/dL (ref 65–99)
POTASSIUM: 3.7 mmol/L (ref 3.5–5.1)
POTASSIUM: 3.8 mmol/L (ref 3.5–5.1)
Sodium: 140 mmol/L (ref 135–145)
Sodium: 139 mmol/L (ref 135–145)
TOTAL PROTEIN: 8.4 g/dL — AB (ref 6.5–8.1)
TOTAL PROTEIN: 5.6 g/dL — AB (ref 6.5–8.1)

## 2017-01-20 LAB — MRSA PCR SCREENING: MRSA BY PCR: NEGATIVE

## 2017-01-20 LAB — URINALYSIS, ROUTINE W REFLEX MICROSCOPIC
BILIRUBIN URINE: NEGATIVE
GLUCOSE, UA: NEGATIVE mg/dL
HGB URINE DIPSTICK: NEGATIVE
KETONES UR: NEGATIVE mg/dL
Leukocytes, UA: NEGATIVE
NITRITE: NEGATIVE
PH: 6 (ref 5.0–8.0)
Protein, ur: NEGATIVE mg/dL
Specific Gravity, Urine: 1.005 (ref 1.005–1.030)

## 2017-01-20 LAB — TSH: TSH: 0.871 u[IU]/mL (ref 0.350–4.500)

## 2017-01-20 LAB — AMMONIA
AMMONIA: 56 umol/L — AB (ref 9–35)
Ammonia: 159 umol/L — ABNORMAL HIGH (ref 9–35)
Ammonia: 171 umol/L — ABNORMAL HIGH (ref 9–35)
Ammonia: 153 umol/L — ABNORMAL HIGH (ref 9–35)

## 2017-01-20 LAB — RAPID URINE DRUG SCREEN, HOSP PERFORMED
AMPHETAMINES: NOT DETECTED
Barbiturates: NOT DETECTED
Benzodiazepines: NOT DETECTED
Cocaine: NOT DETECTED
OPIATES: NOT DETECTED
Tetrahydrocannabinol: NOT DETECTED

## 2017-01-20 LAB — VALPROIC ACID LEVEL
VALPROIC ACID LVL: 344 ug/mL — AB (ref 50.0–100.0)
Valproic Acid Lvl: 562 ug/mL (ref 50.0–100.0)
Valproic Acid Lvl: 198 ug/mL (ref 50.0–100.0)
Valproic Acid Lvl: 145 ug/mL — ABNORMAL HIGH (ref 50.0–100.0)

## 2017-01-20 LAB — CBG MONITORING, ED: Glucose-Capillary: 82 mg/dL (ref 65–99)

## 2017-01-20 LAB — SALICYLATE LEVEL

## 2017-01-20 LAB — PREGNANCY, URINE: Preg Test, Ur: NEGATIVE

## 2017-01-20 LAB — LIPASE, BLOOD: Lipase: 38 U/L (ref 11–51)

## 2017-01-20 LAB — MAGNESIUM: Magnesium: 2.2 mg/dL (ref 1.7–2.4)

## 2017-01-20 MED ORDER — TOPIRAMATE 25 MG PO TABS
50.0000 mg | ORAL_TABLET | Freq: Two times a day (BID) | ORAL | Status: DC
  Filled 2017-01-20: qty 2

## 2017-01-20 MED ORDER — BUSPIRONE HCL 5 MG PO TABS
15.0000 mg | ORAL_TABLET | Freq: Three times a day (TID) | ORAL | Status: DC
  Administered 2017-01-20 – 2017-01-21 (×4): 15 mg via ORAL
  Filled 2017-01-20 (×4): qty 3

## 2017-01-20 MED ORDER — SODIUM CHLORIDE 0.9 % IV BOLUS (SEPSIS)
1000.0000 mL | Freq: Once | INTRAVENOUS | Status: AC
  Administered 2017-01-20: 1000 mL via INTRAVENOUS

## 2017-01-20 MED ORDER — LURASIDONE HCL 40 MG PO TABS
40.0000 mg | ORAL_TABLET | Freq: Every day | ORAL | Status: DC
  Administered 2017-01-20: 40 mg via ORAL
  Filled 2017-01-20 (×2): qty 1

## 2017-01-20 MED ORDER — TOPIRAMATE 100 MG PO TABS
100.0000 mg | ORAL_TABLET | Freq: Two times a day (BID) | ORAL | Status: DC
  Administered 2017-01-20 – 2017-01-21 (×3): 100 mg via ORAL
  Filled 2017-01-20 (×3): qty 1

## 2017-01-20 MED ORDER — ONDANSETRON HCL 4 MG/2ML IJ SOLN
4.0000 mg | Freq: Once | INTRAMUSCULAR | Status: AC
  Administered 2017-01-20: 4 mg via INTRAVENOUS
  Filled 2017-01-20: qty 2

## 2017-01-20 MED ORDER — SODIUM CHLORIDE 0.9 % IV BOLUS (SEPSIS)
1000.0000 mL | Freq: Once | INTRAVENOUS | Status: AC
Start: 2017-01-20 — End: 2017-01-20
  Administered 2017-01-20: 1000 mL via INTRAVENOUS

## 2017-01-20 MED ORDER — LEVOCARNITINE 200 MG/ML IV SOLN
6000.0000 mg | Freq: Once | INTRAVENOUS | Status: DC
  Filled 2017-01-20: qty 30

## 2017-01-20 MED ORDER — SODIUM CHLORIDE 0.9 % IV SOLN
Freq: Four times a day (QID) | INTRAVENOUS | Status: DC
  Filled 2017-01-20 (×2): qty 5

## 2017-01-20 MED ORDER — CALCIUM CARBONATE ANTACID 500 MG PO CHEW
CHEWABLE_TABLET | Freq: Three times a day (TID) | ORAL | Status: DC
  Administered 2017-01-20 – 2017-01-21 (×4): 200 mg via ORAL
  Filled 2017-01-20 (×4): qty 1

## 2017-01-20 MED ORDER — LEVOCARNITINE 200 MG/ML IV SOLN
15.0000 mg/kg | INTRAVENOUS | Status: DC
  Filled 2017-01-20 (×3): qty 5.9

## 2017-01-20 MED ORDER — SODIUM CHLORIDE 0.9 % IV SOLN
Freq: Once | INTRAVENOUS | Status: AC
  Administered 2017-01-20: 09:00:00 via INTRAVENOUS
  Filled 2017-01-20: qty 30

## 2017-01-20 MED ORDER — PROCHLORPERAZINE EDISYLATE 5 MG/ML IJ SOLN: 10.0000 mg | Freq: Four times a day (QID) | INTRAMUSCULAR | Status: DC | PRN

## 2017-01-20 MED ORDER — TRAZODONE HCL 50 MG PO TABS
200.0000 mg | ORAL_TABLET | Freq: Every day | ORAL | Status: DC
  Administered 2017-01-20: 200 mg via ORAL
  Filled 2017-01-20: qty 4

## 2017-01-20 MED ORDER — SODIUM CHLORIDE 0.9 % IV SOLN
INTRAVENOUS | Status: DC
  Administered 2017-01-20: 14:00:00 via INTRAVENOUS
  Filled 2017-01-20 (×4): qty 5

## 2017-01-20 MED ORDER — BUTORPHANOL TARTRATE 10 MG/ML NA SOLN: 1.0000 | NASAL | Status: DC | PRN

## 2017-01-20 MED ORDER — ETHOSUXIMIDE 250 MG PO CAPS
250.0000 mg | ORAL_CAPSULE | Freq: Two times a day (BID) | ORAL | Status: DC
  Administered 2017-01-20 – 2017-01-21 (×3): 250 mg via ORAL
  Filled 2017-01-20 (×5): qty 1

## 2017-01-20 MED ORDER — POTASSIUM CHLORIDE IN NACL 20-0.9 MEQ/L-% IV SOLN
INTRAVENOUS | Status: DC
  Administered 2017-01-20 (×2): via INTRAVENOUS
  Filled 2017-01-20: qty 1000

## 2017-01-20 MED ORDER — PANTOPRAZOLE SODIUM 40 MG IV SOLR
40.0000 mg | Freq: Once | INTRAVENOUS | Status: AC
  Administered 2017-01-20: 40 mg via INTRAVENOUS
  Filled 2017-01-20: qty 40

## 2017-01-20 MED ORDER — PANTOPRAZOLE SODIUM 40 MG PO TBEC
40.0000 mg | DELAYED_RELEASE_TABLET | Freq: Every day | ORAL | Status: DC
  Administered 2017-01-21: 40 mg via ORAL
  Filled 2017-01-20: qty 1

## 2017-01-20 MED ORDER — LEVOCARNITINE 200 MG/ML IV SOLN
15.0000 mg/kg | INTRAVENOUS | Status: DC
  Filled 2017-01-20 (×2): qty 5.9

## 2017-01-20 MED ORDER — PROCHLORPERAZINE EDISYLATE 5 MG/ML IJ SOLN
5.0000 mg | Freq: Once | INTRAMUSCULAR | Status: AC
  Administered 2017-01-20: 5 mg via INTRAVENOUS
  Filled 2017-01-20: qty 2

## 2017-01-20 NOTE — Progress Notes (Signed)
0802 spoke with Dory Larsen, pharmacist regarding patient's poison control order for Levocarnitine (Carnitor). Lorie reported that this medication would have to be sent from [facility] and that she would adjust the medication administration times accordingly.

## 2017-01-20 NOTE — Progress Notes (Signed)
[facility] called and voicemail left with Miles Costain regarding neurology consult has been discontinued and no longer requested at this time.
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
## 2017-01-20 NOTE — Progress Notes (Signed)
CRITICAL VALUE ALERT  Critical Value:  Valproic Acid 344  Date & Time Notied:  01/20/2017 @ 0807  Provider Notified: Dr. Carles Collet  Orders Received/Actions taken: Patient already has order for Carnitor, pharmacy aware

## 2017-01-20 NOTE — Progress Notes (Addendum)
PROGRESS NOTE  Kristin Braun ZOX:[id number] DOB: 20-Jan-1976 DOA: 01/20/2017 PCP: Ladora Daniel, PA-C  Brief History:  41 year old female with a history of bipolar disorder, PTSD, epilepsy, fibromyalgia, migraine headaches presenting after syncopal episode at home.  There was concerns for seizure.  The patient stated that she was trying to urinate, and the next thing she remembered was her mother at her side.  Apparently, her son found her on the floor lethargic.  The patient states that she has been feeling dizzy and lightheaded upon standing.  The patient had an emergency department visit on January 14, 2017 with a similar presentation and complaints of diarrhea.  She states that she has been having loose stools, approximately 8-9 episodes daily for the past 2 weeks.  She also complains of lower abdominal pain that is sharp in nature and intermittent.  She has had some nausea without any vomiting.  She denies any fevers, chills, chest pain, shortness of breath.  During her November 29 visit, her Depakote level was undetectable.  The patient was instructed to restart her previous dose of Depakote 750 mg twice daily.  Her Depakote level at the time of this admission was 562.  Ammonia level was 159.  The patient was started on carnitine and admitted to stepdown unit for close monitoring.  The patient denies any illegal drug use.  She denies any homicidal or suicidal ideations.  Assessment/Plan: Syncope -Etiology likely vagal given the patient's prodromal symptoms -Orthostatic vital signs negative -Monitor on telemetry -Echocardiogram -Consult cardiology -EEG -UDS  Depakote toxicity -Unclear what dose the patient is actually taking -Patient has given different accounts of information -Previously, the patient stated she took 250 mg twice daily -In the morning of January 20, 2017, patient stated that she takes 1000 mg twice daily -During prior visit with her neurologist, Dr. Vickey Huger,  pt takes 750 mg bid -hold for now due to toxic levels -poison control center contacted and continues to follow and make recommendations -continue IVF and tele monitoring -personally reviewed EKG--sinus, nonspecific T-wave changes  Seizure like activity -Continue ethosuximide -Her outpt neurologist had referred her to EMU at [facility], but pt did not go -Holding the Depakote as discussed -documented noncompliance has been noted in medical record -case discussed with neurology/epileptology, Dr. Aquino--d/c depakote altogether given concerns above--increase topamax to 100 mg bid  Hyperammonemia -due to Depakote -continue L-carnitine -monitor serial levels  Chronic migraines -Continue Topamax -The [place] Controlled Substance Reporting System has been queried for this patient--no Stadol Rx since 12/04/15 -UDS  Gastroenteritis -no BM since admission -check Cdiff and GI pathogen panel -continue IVF     Disposition Plan:   Home in 1-2 days  Family Communication:  No Family at bedside--Total time spent 45 minutes.  Greater than 50% spent face to face counseling and coordinating care. 0715 to 0800   Consultants:  neruology  Code Status:  FULL  DVT Prophylaxis:  SCDs   Procedures: As Listed in Progress Note Above  Antibiotics: None    Subjective: Patient denies fevers, chills, headache, chest pain, shortness breath, abdominal pain, dysuria, hematuria.  She has not had a bowel movement since admission.  Objective: Vitals:   01/20/17 0300 01/20/17 0330 01/20/17 0521 01/20/17 0600  BP: 104/76 (!) 97/49 116/74 (!) 83/44  Pulse: (!) 102 90 96 97  Resp: (!) 26 16  16   Temp:   99 F (37.2 C)   TempSrc:   Oral   SpO2:  97% 94% 100% 96%  Weight:   78.8 kg (173 lb 11.6 oz)   Height:   5\' 3"  (1.6 m)     Intake/Output Summary (Last 24 hours) at 01/20/2017 0741 Last data filed at 01/20/2017 0530 Gross per 24 hour  Intake 141.67 ml  Output 300 ml  Net -158.33 ml    Weight change:  Exam:   General:  Pt is alert, follows commands appropriately, not in acute distress  HEENT: No icterus, No thrush, No neck mass, [place]/AT  Cardiovascular: RRR, S1/S2, no rubs, no gallops  Respiratory: CTA bilaterally, no wheezing, no crackles, no rhonchi  Abdomen: Soft/+BS, non tender, non distended, no guarding  Extremities: No edema, No lymphangitis, No petechiae, No rashes, no synovitis   Data Reviewed: I have personally reviewed following labs and imaging studies Basic Metabolic Panel: Recent Labs  Lab 01/14/17 2343 01/20/17 0100 01/20/17 0347  NA 137 140  --   K 3.5 3.7  --   CL 105 104  --   CO2 24 22  --   GLUCOSE 82 88  --   BUN 10 10  --   CREATININE 0.69 0.96  --   CALCIUM 9.8 9.7  --   MG  --   --  2.2   Liver Function Tests: Recent Labs  Lab 01/14/17 2343 01/20/17 0100  AST 21 35  ALT 18 33  ALKPHOS 80 65  BILITOT 0.3 0.5  PROT 8.2* 8.4*  ALBUMIN 4.7 4.7   Recent Labs  Lab 01/20/17 0100  LIPASE 38   Recent Labs  Lab 01/20/17 0347 01/20/17 0655  AMMONIA 159* 56*   Coagulation Profile: No results for input(s): INR, PROTIME in the last 168 hours. CBC: Recent Labs  Lab 01/14/17 2343 01/20/17 0100  WBC 9.4 8.7  NEUTROABS 4.9 3.7  HGB 16.1* 16.7*  HCT 48.0* 50.1*  MCV 90.9 92.3  PLT 241 231   Cardiac Enzymes: Recent Labs  Lab 01/14/17 2343  TROPONINI <0.03   BNP: Invalid input(s): POCBNP CBG: Recent Labs  Lab 01/20/17 0254  GLUCAP 82   HbA1C: No results for input(s): HGBA1C in the last 72 hours. Urine analysis:    Component Value Date/Time   COLORURINE COLORLESS (A) 01/20/2017 0130   APPEARANCEUR CLEAR 01/20/2017 0130   LABSPEC 1.005 01/20/2017 0130   PHURINE 6.0 01/20/2017 0130   GLUCOSEU NEGATIVE 01/20/2017 0130   HGBUR NEGATIVE 01/20/2017 0130   BILIRUBINUR NEGATIVE 01/20/2017 0130   KETONESUR NEGATIVE 01/20/2017 0130   PROTEINUR NEGATIVE 01/20/2017 0130   UROBILINOGEN 1.0 06/03/2014 0902    NITRITE NEGATIVE 01/20/2017 0130   LEUKOCYTESUR NEGATIVE 01/20/2017 0130   Sepsis Labs: @LABRCNTIP (procalcitonin:4,lacticidven:4) )No results found for this or any previous visit (from the past 240 hour(s)).   Scheduled Meds: . busPIRone  15 mg Oral TID  . calcium carbonate   Oral TID  . ethosuximide  250 mg Oral BID  . levOCARNitine  15 mg/kg Intravenous Q4H  . levOCARNitine  6,000 mg Intravenous Once  . lurasidone  40 mg Oral Q supper  . [START ON 01/21/2017] pantoprazole  40 mg Oral Daily  . topiramate  50 mg Oral BID  . traZODone  200 mg Oral QHS   Continuous Infusions: . 0.9 % NaCl with KCl 20 mEq / L 125 mL/hr at 01/20/17 0422    Procedures/Studies: Ct Head Wo Contrast  Result Date: 01/20/2017 CLINICAL DATA:  41 year old female with head trauma and altered mental status. Seizure. EXAM: CT HEAD WITHOUT CONTRAST TECHNIQUE:  Contiguous axial images were obtained from the base of the skull through the vertex without intravenous contrast. COMPARISON:  Head CT dated 03/23/2015 FINDINGS: Brain: The ventricles and sulci appropriate size for patient's age. Stable area of encephalomalacia in the inferior left frontal lobe again noted. There is no acute intracranial hemorrhage. No mass effect or midline shift. No extra-axial fluid collection. Vascular: No hyperdense vessel or unexpected calcification. Skull: Postsurgical changes of left temporal craniectomy. No acute calvarial pathology. Sinuses/Orbits: Clear Other: Abrasion of the skin over the frontal calvarium. IMPRESSION: 1. No acute intracranial pathology. 2. Focal area of encephalomalacia in the inferior left frontal lobe similar to prior CT. Electronically Signed   By: Elgie Collard M.D.   On: 01/20/2017 03:16   Ct Abdomen Pelvis W Contrast  Result Date: 12/30/2016 CLINICAL DATA:  Two week history of right-sided pain and diarrhea EXAM: CT ABDOMEN AND PELVIS WITH CONTRAST TECHNIQUE: Multidetector CT imaging of the abdomen and pelvis  was performed using the standard protocol following bolus administration of intravenous contrast. CONTRAST:  ISOVUE-300 IOPAMIDOL (ISOVUE-300) INJECTION 61% COMPARISON:  June 03, 2014 FINDINGS: Lower chest: Lung bases are clear. Hepatobiliary: There is a cyst in the medial segment of the left lobe of the liver measuring 3.3 x 3.1 cm. There is a cyst anteriorly at the level of the gallbladder fossa on the right measuring 1.3 x 1.2 cm. There is a cyst in the anterior segment of the right lobe of the liver measuring 1.2 x 1.2 cm. There are occasional subcentimeter cysts scattered in the liver is with well. No noncystic liver lesions are evident. Gallbladder wall is not appreciably thickened. There is no biliary duct dilatation. Pancreas: No pancreatic mass or inflammatory focus is evident. Spleen: No splenic lesions are evident. Adrenals/Urinary Tract: Adrenals appear normal bilaterally. Kidneys bilaterally show no evident mass or hydronephrosis on either side. There is no renal or ureteral calculus on either side. Urinary bladder is midline with wall thickness within normal limits. Stomach/Bowel: There is no appreciable bowel wall or mesenteric thickening. There is moderate stool in the colon. No bowel obstruction. No free air or portal venous air. Vascular/Lymphatic: There is no abdominal aortic aneurysm. No vascular lesions are evident. There is no appreciable adenopathy in the abdomen or pelvis. Reproductive: Uterus is anteverted. No pelvic mass evident. Tubal ligation clips are present bilaterally. Other: Appendix appears normal. There is no ascites or abscess in the abdomen or pelvis. There is a small ventral hernia slightly superior to the umbilicus containing only fat. Musculoskeletal: There are no blastic or lytic bone lesions. There is no intramuscular or abdominal wall lesion. IMPRESSION: 1. A cause for patient's symptoms has not been established with this study. 2. No bowel thickening or bowel  obstruction. No abscess. Appendix appears normal. 3.  No renal or ureteral calculus.  No hydronephrosis. 4.  Small ventral hernia containing only fat. 5. Scattered liver cysts, largest measuring 3.3 x 3.1 cm. No noncystic liver lesions evident. Electronically Signed   By: Bretta Bang III M.D.   On: 12/30/2016 07:38    Mahi Zabriskie, DO  [facility] Pager [phone]  If 7PM-7AM, please contact night-coverage [email or web address] Password [email or web address] 01/20/2017, 7:41 AM   LOS: 0 days

## 2017-01-20 NOTE — ED Triage Notes (Signed)
Pt states she was in the bathroom trying to urinate when she had a seizure; pt states her son found her lying on the floor; pt's mother came and took pt to her house but mother states pt was acting more lethargic and not herself; pt c/o some abdominal with diarrhea today; pt is alert and oriented; pt states she is having difficulty hearing and it sounds like everyone is in a "tunnel"

## 2017-01-20 NOTE — Progress Notes (Signed)
Offsite EEG completed at [facility].  Results pending. 
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
## 2017-01-20 NOTE — Progress Notes (Signed)
New cardiology and neurology consults noted. Dr.Koneswaran and Dr.Doonquah added to patient's treatment team. [facility] notified and message left on Miles Costain voicemail regarding new neurology consult for patient.

## 2017-01-20 NOTE — Progress Notes (Signed)
Spoke with Oris Drone at [facility] who reported to change frequency of Carnitor IV infusion to Q6H and have ammonia and valproic acid lab levels drawn Q6H at the same time. Pharmacist, Lorie, notified and made aware.
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
## 2017-01-20 NOTE — ED Notes (Signed)
Date and time results received: 01/20/17 3:25 AM Test: Valproic Acid Critical Value: 562 Name of Provider Notified: Dr.Knapp Orders Received? Or Actions Taken?: MD notified

## 2017-01-20 NOTE — Progress Notes (Signed)
Spoke with Poison Control Rep Oris Drone in regards to most recent ammonia level/valporic acid level, and Levocarnitine (Carnitor) medication. Patient at the current time has improved mentation status and trending down ammonia levels with the current medication. Per Poison Control, the patient does not need to take Levocarnitine anymore in relation to current medical status (trending down ammonia and stable mentation status). Patient mentation status within normal limits, hemodynamically stable at this time, NSR 77, 99 % RA, 106/69 BP, and 15 resp. [facility] notified at this time of Poison Control recommendations. Will continue to monitor the patient at this time.

## 2017-01-21 DIAGNOSIS — T426X1D Poisoning by other antiepileptic and sedative-hypnotic drugs, accidental (unintentional), subsequent encounter: Secondary | ICD-10-CM

## 2017-01-21 DIAGNOSIS — F319 Bipolar disorder, unspecified: Secondary | ICD-10-CM

## 2017-01-21 LAB — COMPREHENSIVE METABOLIC PANEL
ALT: 21 U/L (ref 14–54)
AST: 19 U/L (ref 15–41)
Albumin: 3.4 g/dL — ABNORMAL LOW (ref 3.5–5.0)
Alkaline Phosphatase: 47 U/L (ref 38–126)
Anion gap: 6 (ref 5–15)
BILIRUBIN TOTAL: 0.9 mg/dL (ref 0.3–1.2)
BUN: 7 mg/dL (ref 6–20)
CALCIUM: 8.9 mg/dL (ref 8.9–10.3)
CO2: 17 mmol/L — ABNORMAL LOW (ref 22–32)
CREATININE: 0.78 mg/dL (ref 0.44–1.00)
Chloride: 114 mmol/L — ABNORMAL HIGH (ref 101–111)
Glucose, Bld: 81 mg/dL (ref 65–99)
Potassium: 4.1 mmol/L (ref 3.5–5.1)
Sodium: 137 mmol/L (ref 135–145)
TOTAL PROTEIN: 6 g/dL — AB (ref 6.5–8.1)

## 2017-01-21 LAB — AMMONIA: AMMONIA: 53 umol/L — AB (ref 9–35)

## 2017-01-21 LAB — CBC
HCT: 42.5 % (ref 36.0–46.0)
Hemoglobin: 14 g/dL (ref 12.0–15.0)
MCH: 30.6 pg (ref 26.0–34.0)
MCHC: 32.9 g/dL (ref 30.0–36.0)
MCV: 92.8 fL (ref 78.0–100.0)
PLATELETS: 180 10*3/uL (ref 150–400)
RBC: 4.58 MIL/uL (ref 3.87–5.11)
RDW: 12.9 % (ref 11.5–15.5)
WBC: 7 10*3/uL (ref 4.0–10.5)

## 2017-01-21 LAB — VALPROIC ACID LEVEL
VALPROIC ACID LVL: 102 ug/mL — AB (ref 50.0–100.0)
Valproic Acid Lvl: 69 ug/mL (ref 50.0–100.0)

## 2017-01-21 LAB — MAGNESIUM: MAGNESIUM: 1.9 mg/dL (ref 1.7–2.4)

## 2017-01-21 MED ORDER — TOPIRAMATE 100 MG PO TABS: 100.0000 mg | ORAL_TABLET | Freq: Two times a day (BID) | ORAL | 0 refills | Status: DC

## 2017-01-21 MED ORDER — DIVALPROEX SODIUM ER 500 MG PO TB24: 500.0000 mg | ORAL_TABLET | Freq: Every day | ORAL | 0 refills | Status: DC

## 2017-01-21 NOTE — Procedures (Signed)
  Kristin A. Merlene Laughter, MD     [email or web address]           HISTORY: This is a 41 year old female who presents with episode of syncope and questionable seizure.  There is a baseline history of seizures.  MEDICATIONS: Scheduled Meds: Continuous Infusions: PRN Meds:.  Prior to Admission medications   Medication Sig Start Date End Date Taking? Authorizing Provider  busPIRone (BUSPAR) 15 MG tablet Take 15 mg by mouth 3 (three) times daily.     [provider]  butorphanol (STADOL) 10 MG/ML nasal spray Place 1 spray into the nose every 4 (four) hours as needed for headache. 01/19/17   Dohmeier, Asencion Partridge, MD  divalproex (DEPAKOTE ER) 250 MG 24 hr tablet Take 3 tablets (750 mg total) by mouth 2 (two) times daily. Patient taking differently: Take 250 mg by mouth 2 (two) times daily.  08/26/16   Dohmeier, Asencion Partridge, MD  ethosuximide (ZARONTIN) 250 MG capsule Take 1 capsule (250 mg total) by mouth 2 (two) times daily. 08/26/16   Dohmeier, Asencion Partridge, MD  lurasidone (LATUDA) 40 MG TABS tablet Take 40 mg by mouth daily with supper.     [provider]  pregabalin (LYRICA) 225 MG capsule Take 225 mg by mouth daily. 02/03/16   [provider]  topiramate (TOPAMAX) 50 MG tablet Take 1 tablet (50 mg total) by mouth 2 (two) times daily. 08/26/16   Dohmeier, Asencion Partridge, MD  traZODone (DESYREL) 100 MG tablet Take 200 mg by mouth at bedtime.  06/27/15   [provider]  venlafaxine XR (EFFEXOR-XR) 75 MG 24 hr capsule Take 75 mg by mouth daily with breakfast.    [provider]      ANALYSIS: A 16 channel recording using standard 10 20 measurements is conducted for 23 minutes.   There is a well-formed posterior dominant rhythm of eight hertz which attenuates with eye-opening.  There is beta activity observed in the frontal areas.  Awake and drowsy activities are observed.  Photic stimulation and hyperventilation are not conducted.  Lateralized slowing is not  observed.  However, there is frequent left temporal slowing associated with sharp wave activity that phase reverses at T3.  No clear electrographic seizures are observed however.   IMPRESSION: 1.   This recording is abnormal showing frequent left temporal epileptiform discharges associated with left temporal slowing.  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        Armondo Cech A. Merlene Braun, M.D.  Diplomate, Tax adviser of Psychiatry and Neurology ( Neurology).

## 2017-01-21 NOTE — Discharge Summary (Signed)
Physician Discharge Summary  Kristin Braun TWS:[id number] DOB: Dec 22, 1975 DOA: 01/20/2017  PCP: Nicholes Rough, PA-C  Admit date: 01/20/2017 Discharge date: 01/21/2017  Admitted From: Home Disposition:  Home   Recommendations for Outpatient Follow-up:  1. Follow up with [name] on 02/01/17 9AM 2. Please obtain valproic acid level and ammonia level in no more than one week and adjust depakote dose accordingly   Discharge Condition: Stable CODE STATUS: FULL Diet recommendation:  Regular   Brief/Interim Summary: 41 year old female with a history of bipolar disorder, PTSD, epilepsy, fibromyalgia, migraine headaches presenting after syncopal episode at home.  There was concerns for seizure.  The patient stated that she was trying to urinate, and the next thing she remembered was her mother at her side.  Apparently, her son found her on the floor lethargic.  The patient states that she has been feeling dizzy and lightheaded upon standing.  The patient had an emergency department visit on January 14, 2017 with a similar presentation and complaints of diarrhea.  She states that she has been having loose stools, approximately 8-9 episodes daily for the past 2 weeks.  She also complains of lower abdominal pain that is sharp in nature and intermittent.  She has had some nausea without any vomiting.  She denies any fevers, chills, chest pain, shortness of breath.  During her November 29 visit, her Depakote level was undetectable.  The patient was instructed to restart her previous dose of Depakote 750 mg twice daily.  Her Depakote level at the time of this admission was 562.  Ammonia level was 159.  The patient was started on carnitine and admitted to stepdown unit for close monitoring.  The patient denies any illegal drug use.  She denies any homicidal or suicidal ideations.  Her Depakote levels and ammonia levels were trended during the hospitalization and they gradually improved.   The patient's mental status returned to baseline.  Her diet was advanced which she tolerated.    Discharge Diagnoses:  Syncope -Etiology likely vagal given the patient's prodromal symptoms -Orthostatic vital signs negative -Monitor on telemetry--no concerning dysrhythmia -Echocardiogram--not needed per cardiology -Consult cardiology-->no further work up as pt already has had extensive work up long history of orthostatic dizziness and occasionally orthostatic syncope.  She has a history of palpitations.  She underwent an echocardiogram, event monitor, tilt table test, and nuclear stress test, all of which were normal through cardiology at [facility] temporal slowing with discharges -UDS--neg  Depakote toxicity -VPA level peaked 562 -Unclear what dose the patient is actually taking -Patient has given different accounts of information -Previously, the patient stated she took 250 mg twice daily -In the morning of January 20, 2017, patient stated that she takes 1000 mg twice daily -During prior visit with her neurologist, Dr. Brett Fairy, pt takes 750 mg bid -held during hospitalization due to toxic levels -poison control center contacted and continues to follow and make recommendations -continue IVF and tele monitoring--no concerning dysrhythmias -personally reviewed EKG--sinus, nonspecific T-wave changes -Patient ultimately brought her bottles showing depakote 1000mg  bid -VPA level 69 at time of d/c--restart depakote at 500 mg once daily -pt states she has not taken Zarontin in ~3 months-->will not restart; pt cannot afford  Seizure like activity -Continue ethosuximide -Her outpt neurologist had referred her to EMU at [facility], but pt did not go due to financial situation -Holding the Depakote as discussed -documented noncompliance has been noted in medical record -case discussed with neurology/epileptology, Dr. Aquino--d/c depakote for  now--increase topamax to 100 mg bid -restart  depakote at 500 mg once daily -recheck depakote level 1 week after d/c  Hyperammonemia -due to Depakote -continue L-carnitine -monitor serial levels 562>>>53 at time of d/c -repeat ammonia level in one week after d/c -mental status at baseline at time of d/c  Chronic migraines -Continue Topamax -The [place] Controlled Substance Reporting System has been queried for this patient--no Stadol Rx since 12/04/15 -UDS--neg  Gastroenteritis -no BM since admission -check Cdiff and GI pathogen panel--cancelled -continue IVF     Discharge Instructions  Discharge Instructions    Diet general   Complete by:  As directed    Increase activity slowly   Complete by:  As directed      Allergies as of 01/21/2017      Reactions   Penicillins    UNSPECIFIED REACTION    Morphine And Related Rash      Medication List    STOP taking these medications   butorphanol 10 MG/ML nasal spray Commonly known as:  STADOL   ethosuximide 250 MG capsule Commonly known as:  ZARONTIN     TAKE these medications   busPIRone 15 MG tablet Commonly known as:  BUSPAR Take 15 mg by mouth 3 (three) times daily.   divalproex 500 MG 24 hr tablet Commonly known as:  DEPAKOTE ER Take 1 tablet (500 mg total) by mouth daily. What changed:    medication strength  how much to take  when to take this   lurasidone 40 MG Tabs tablet Commonly known as:  LATUDA Take 40 mg by mouth daily with supper.   pregabalin 225 MG capsule Commonly known as:  LYRICA Take 225 mg by mouth daily.   topiramate 100 MG tablet Commonly known as:  TOPAMAX Take 1 tablet (100 mg total) by mouth 2 (two) times daily. What changed:    medication strength  how much to take   traZODone 100 MG tablet Commonly known as:  DESYREL Take 200 mg by mouth at bedtime.   venlafaxine XR 75 MG 24 hr capsule Commonly known as:  EFFEXOR-XR Take 75 mg by mouth daily with breakfast.       Allergies  Allergen  Reactions  . Penicillins     UNSPECIFIED REACTION   . Morphine And Related Rash    Consultations:  Dr. Delice Lesch on phone   Procedures/Studies: Ct Head Wo Contrast  Result Date: 01/20/2017 CLINICAL DATA:  41 year old female with head trauma and altered mental status. Seizure. EXAM: CT HEAD WITHOUT CONTRAST TECHNIQUE: Contiguous axial images were obtained from the base of the skull through the vertex without intravenous contrast. COMPARISON:  Head CT dated 03/23/2015 FINDINGS: Brain: The ventricles and sulci appropriate size for patient's age. Stable area of encephalomalacia in the inferior left frontal lobe again noted. There is no acute intracranial hemorrhage. No mass effect or midline shift. No extra-axial fluid collection. Vascular: No hyperdense vessel or unexpected calcification. Skull: Postsurgical changes of left temporal craniectomy. No acute calvarial pathology. Sinuses/Orbits: Clear Other: Abrasion of the skin over the frontal calvarium. IMPRESSION: 1. No acute intracranial pathology. 2. Focal area of encephalomalacia in the inferior left frontal lobe similar to prior CT. Electronically Signed   By: Anner Crete M.D.   On: 01/20/2017 03:16   Ct Abdomen Pelvis W Contrast  Result Date: 12/30/2016 CLINICAL DATA:  Two week history of right-sided pain and diarrhea EXAM: CT ABDOMEN AND PELVIS WITH CONTRAST TECHNIQUE: Multidetector CT imaging of the abdomen and pelvis was  performed using the standard protocol following bolus administration of intravenous contrast. CONTRAST:  160mL ISOVUE-300 IOPAMIDOL (ISOVUE-300) INJECTION 61% COMPARISON:  June 03, 2014 FINDINGS: Lower chest: Lung bases are clear. Hepatobiliary: There is a cyst in the medial segment of the left lobe of the liver measuring 3.3 x 3.1 cm. There is a cyst anteriorly at the level of the gallbladder fossa on the right measuring 1.3 x 1.2 cm. There is a cyst in the anterior segment of the right lobe of the liver measuring 1.2 x  1.2 cm. There are occasional subcentimeter cysts scattered in the liver is with well. No noncystic liver lesions are evident. Gallbladder wall is not appreciably thickened. There is no biliary duct dilatation. Pancreas: No pancreatic mass or inflammatory focus is evident. Spleen: No splenic lesions are evident. Adrenals/Urinary Tract: Adrenals appear normal bilaterally. Kidneys bilaterally show no evident mass or hydronephrosis on either side. There is no renal or ureteral calculus on either side. Urinary bladder is midline with wall thickness within normal limits. Stomach/Bowel: There is no appreciable bowel wall or mesenteric thickening. There is moderate stool in the colon. No bowel obstruction. No free air or portal venous air. Vascular/Lymphatic: There is no abdominal aortic aneurysm. No vascular lesions are evident. There is no appreciable adenopathy in the abdomen or pelvis. Reproductive: Uterus is anteverted. No pelvic mass evident. Tubal ligation clips are present bilaterally. Other: Appendix appears normal. There is no ascites or abscess in the abdomen or pelvis. There is a small ventral hernia slightly superior to the umbilicus containing only fat. Musculoskeletal: There are no blastic or lytic bone lesions. There is no intramuscular or abdominal wall lesion. IMPRESSION: 1. A cause for patient's symptoms has not been established with this study. 2. No bowel thickening or bowel obstruction. No abscess. Appendix appears normal. 3.  No renal or ureteral calculus.  No hydronephrosis. 4.  Small ventral hernia containing only fat. 5. Scattered liver cysts, largest measuring 3.3 x 3.1 cm. No noncystic liver lesions evident. Electronically Signed   By: Lowella Grip III M.D.   On: 12/30/2016 07:38         Discharge Exam: Vitals:   01/21/17 0730 01/21/17 0800  BP:  122/86  Pulse: 73 78  Resp: (!) 27 15  Temp: 98 F (36.7 C)   SpO2: 97% 98%   Vitals:   01/21/17 0600 01/21/17 0700 01/21/17  0730 01/21/17 0800  BP: 115/71 121/83  122/86  Pulse: 63 (!) 56 73 78  Resp: 19 (!) 21 (!) 27 15  Temp:   98 F (36.7 C)   TempSrc:   Oral   SpO2: 97% 97% 97% 98%  Weight:      Height:        General: Pt is alert, awake, not in acute distress Cardiovascular: RRR, S1/S2 +, no rubs, no gallops Respiratory: CTA bilaterally, no wheezing, no rhonchi Abdominal: Soft, NT, ND, bowel sounds + Extremities: no edema, no cyanosis   The results of significant diagnostics from this hospitalization (including imaging, microbiology, ancillary and laboratory) are listed below for reference.    Significant Diagnostic Studies: Ct Head Wo Contrast  Result Date: 01/20/2017 CLINICAL DATA:  41 year old female with head trauma and altered mental status. Seizure. EXAM: CT HEAD WITHOUT CONTRAST TECHNIQUE: Contiguous axial images were obtained from the base of the skull through the vertex without intravenous contrast. COMPARISON:  Head CT dated 03/23/2015 FINDINGS: Brain: The ventricles and sulci appropriate size for patient's age. Stable area of encephalomalacia in the  inferior left frontal lobe again noted. There is no acute intracranial hemorrhage. No mass effect or midline shift. No extra-axial fluid collection. Vascular: No hyperdense vessel or unexpected calcification. Skull: Postsurgical changes of left temporal craniectomy. No acute calvarial pathology. Sinuses/Orbits: Clear Other: Abrasion of the skin over the frontal calvarium. IMPRESSION: 1. No acute intracranial pathology. 2. Focal area of encephalomalacia in the inferior left frontal lobe similar to prior CT. Electronically Signed   By: Anner Crete M.D.   On: 01/20/2017 03:16   Ct Abdomen Pelvis W Contrast  Result Date: 12/30/2016 CLINICAL DATA:  Two week history of right-sided pain and diarrhea EXAM: CT ABDOMEN AND PELVIS WITH CONTRAST TECHNIQUE: Multidetector CT imaging of the abdomen and pelvis was performed using the standard protocol  following bolus administration of intravenous contrast. CONTRAST:  153mL ISOVUE-300 IOPAMIDOL (ISOVUE-300) INJECTION 61% COMPARISON:  June 03, 2014 FINDINGS: Lower chest: Lung bases are clear. Hepatobiliary: There is a cyst in the medial segment of the left lobe of the liver measuring 3.3 x 3.1 cm. There is a cyst anteriorly at the level of the gallbladder fossa on the right measuring 1.3 x 1.2 cm. There is a cyst in the anterior segment of the right lobe of the liver measuring 1.2 x 1.2 cm. There are occasional subcentimeter cysts scattered in the liver is with well. No noncystic liver lesions are evident. Gallbladder wall is not appreciably thickened. There is no biliary duct dilatation. Pancreas: No pancreatic mass or inflammatory focus is evident. Spleen: No splenic lesions are evident. Adrenals/Urinary Tract: Adrenals appear normal bilaterally. Kidneys bilaterally show no evident mass or hydronephrosis on either side. There is no renal or ureteral calculus on either side. Urinary bladder is midline with wall thickness within normal limits. Stomach/Bowel: There is no appreciable bowel wall or mesenteric thickening. There is moderate stool in the colon. No bowel obstruction. No free air or portal venous air. Vascular/Lymphatic: There is no abdominal aortic aneurysm. No vascular lesions are evident. There is no appreciable adenopathy in the abdomen or pelvis. Reproductive: Uterus is anteverted. No pelvic mass evident. Tubal ligation clips are present bilaterally. Other: Appendix appears normal. There is no ascites or abscess in the abdomen or pelvis. There is a small ventral hernia slightly superior to the umbilicus containing only fat. Musculoskeletal: There are no blastic or lytic bone lesions. There is no intramuscular or abdominal wall lesion. IMPRESSION: 1. A cause for patient's symptoms has not been established with this study. 2. No bowel thickening or bowel obstruction. No abscess. Appendix appears  normal. 3.  No renal or ureteral calculus.  No hydronephrosis. 4.  Small ventral hernia containing only fat. 5. Scattered liver cysts, largest measuring 3.3 x 3.1 cm. No noncystic liver lesions evident. Electronically Signed   By: Lowella Grip III M.D.   On: 12/30/2016 07:38     Microbiology: Recent Results (from the past 240 hour(s))  MRSA PCR Screening     Status: None   Collection Time: 01/20/17  5:06 AM  Result Value Ref Range Status   MRSA by PCR NEGATIVE NEGATIVE Final    Comment:        The GeneXpert MRSA Assay (FDA approved for NASAL specimens only), is one component of a comprehensive MRSA colonization surveillance program. It is not intended to diagnose MRSA infection nor to guide or monitor treatment for MRSA infections.      Labs: Basic Metabolic Panel: Recent Labs  Lab 01/14/17 2343 01/20/17 0100 01/20/17 0347 01/20/17 1257 01/21/17 0531  NA 137 140  --  139 137  K 3.5 3.7  --  3.8 4.1  CL 105 104  --  113* 114*  CO2 24 22  --  20* 17*  GLUCOSE 82 88  --  82 81  BUN 10 10  --  8 7  CREATININE 0.69 0.96  --  0.86 0.78  CALCIUM 9.8 9.7  --  7.7* 8.9  MG  --   --  2.2  --  1.9   Liver Function Tests: Recent Labs  Lab 01/14/17 2343 01/20/17 0100 01/20/17 1257 01/21/17 0531  AST 21 35 27 19  ALT 18 33 25 21  ALKPHOS 80 65 45 47  BILITOT 0.3 0.5 0.4 0.9  PROT 8.2* 8.4* 5.6* 6.0*  ALBUMIN 4.7 4.7 3.3* 3.4*   Recent Labs  Lab 01/20/17 0100  LIPASE 38   Recent Labs  Lab 01/20/17 0347 01/20/17 0655 01/20/17 1257 01/20/17 1916 01/21/17 0045  AMMONIA 159* 56* 171* 153* 53*   CBC: Recent Labs  Lab 01/14/17 2343 01/20/17 0100 01/21/17 0531  WBC 9.4 8.7 7.0  NEUTROABS 4.9 3.7  --   HGB 16.1* 16.7* 14.0  HCT 48.0* 50.1* 42.5  MCV 90.9 92.3 92.8  PLT 241 231 180   Cardiac Enzymes: Recent Labs  Lab 01/14/17 2343  TROPONINI <0.03   BNP: Invalid input(s): POCBNP CBG: Recent Labs  Lab 01/20/17 0254  GLUCAP 82    Time  coordinating discharge:  Greater than 30 minutes  Signed:  Orson Eva, DO [facility] Pager: [phone] 01/21/2017, 1:48 PM

## 2017-01-21 NOTE — Clinical Social Work Note (Signed)
Received CSW consult for insurance application for pt. Discussed pt with RN CM. Pt financial counselor is working with pt on [employer] application. LCSW will be available if other needs arise. Will sign off for now.

## 2017-01-21 NOTE — Care Management Note (Addendum)
Case Management Note  Patient Details  Name: Kristin Braun MRN: [id number] Date of Birth: [birth year]    Expected Discharge Date:  01/21/17               Expected Discharge Plan:  Home/Self Care  In-House Referral:     Discharge planning Services  CM Consult, [facility], [facility] Program  Post Acute Care Choice:  NA Choice offered to:     DME Arranged:    DME Agency:     HH Arranged:    HH Agency:     Status of Service:  Completed, signed off  If discussed at [employer] of Stay Meetings, dates discussed:    Additional Comments: Patient discharging home today. Patient does not have insurance or PCP. She receives her medications from [facility]. She will have new scripts for Depakote and  Topamax. She will take those today to [facility] today to have filled. Appointment made with Patient [place] ([facility]) on Monday 17th at 0900. Added to AVS. Patient will also need  Depakote level checked in a week. She plans to go to [facility] or [facility] lab, she reports she has a friend who can help her pay for this one time lab OOP.  Patient will take her lab result with her to her appointment, if abnormal patient reports she will come to ER.  Financial counselor has met with patient, she has applied for Medicaid and has a disability hearing in February.    Starkeisha Vanwinkle, Chauncey Reading, RN 01/21/2017, 1:59 PM

## 2017-02-01 ENCOUNTER — Encounter: Payer: Self-pay | Admitting: Family Medicine

## 2017-02-01 ENCOUNTER — Ambulatory Visit (INDEPENDENT_AMBULATORY_CARE_PROVIDER_SITE_OTHER): Payer: Self-pay | Admitting: Family Medicine

## 2017-02-01 VITALS — BP 106/70 | HR 80 | Temp 98.7°F | Resp 16 | Ht 63.0 in | Wt 174.0 lb

## 2017-02-01 DIAGNOSIS — F319 Bipolar disorder, unspecified: Secondary | ICD-10-CM

## 2017-02-01 DIAGNOSIS — Z1389 Encounter for screening for other disorder: Secondary | ICD-10-CM

## 2017-02-01 DIAGNOSIS — G43809 Other migraine, not intractable, without status migrainosus: Secondary | ICD-10-CM

## 2017-02-01 DIAGNOSIS — K219 Gastro-esophageal reflux disease without esophagitis: Secondary | ICD-10-CM

## 2017-02-01 DIAGNOSIS — Z3200 Encounter for pregnancy test, result unknown: Secondary | ICD-10-CM

## 2017-02-01 DIAGNOSIS — T426X1D Poisoning by other antiepileptic and sedative-hypnotic drugs, accidental (unintentional), subsequent encounter: Secondary | ICD-10-CM

## 2017-02-01 DIAGNOSIS — R569 Unspecified convulsions: Secondary | ICD-10-CM

## 2017-02-01 DIAGNOSIS — Z131 Encounter for screening for diabetes mellitus: Secondary | ICD-10-CM

## 2017-02-01 LAB — POCT GLYCOSYLATED HEMOGLOBIN (HGB A1C): HEMOGLOBIN A1C: 5.2

## 2017-02-01 LAB — POCT URINE PREGNANCY: PREG TEST UR: NEGATIVE

## 2017-02-01 MED ORDER — OMEPRAZOLE 40 MG PO CPDR: 40.0000 mg | DELAYED_RELEASE_CAPSULE | Freq: Every day | ORAL | 3 refills | Status: DC

## 2017-02-01 NOTE — Progress Notes (Signed)
Kristin Braun, is a 41 y.o. female  DVV:[id number]  GYI:[id number]  DOB - 1975/08/07  CC:  Chief Complaint  Patient presents with  . Establish Care       HPI: Kristin Braun is a 41 y.o. female is here today to establish care and recent hospital follow-up. Medical problems include seizure,  TBI, PTSD, osteoarthritis, GERD, migraine headaches, and  Bipolar disorder. She is followed by [facility] behavioral health services for management of active bipolar disorder and Dr. [name] is her mental health provider. Kristin Braun was recently hospitalized for valproic acid toxicity, seizure, and bipolar disorder. Reports that valproic acid overdose was unintentional. Reports taking medication inconsistently and when she remembers to take medication she takes additional doses at one time to make up for prior missed doses. She reports limited recollection of the events that led up to her hospitalization on 01/20/2017. Per hospital admission summary, patient's son found her lying in the bathroom floor of their home lethargic. Patient arrived to [facility] and was found to have toxic levels of valproic acid in her system and valproic acid  induced hyperammonemia. She was transferred to stepdown unit for which she remained for 38 hours and discharged once mental status normalized.  Patient is uninsured therefore was referred here to [facility] to establish with primary care provide. Due to financial status, at present, she doesn't have a neurologist managing her chronic seizures. TBI. or migraines. She has a history of medication noncompliance and during her visit today, is confused about medications and doses, therefore refers to her last discharge summary to reference current medications prescribed. [name] lives close to family and has a son and boyfriend that resides in her home. [name] admits to severe mental illness including auditory hallucinations (chronic over several years), prior thoughts and suicidal attempts which  resulted in impatient admissions to [facility] facilities.  Although adamantly denies that the recent overdose of Valproic acid was attempt to end her life. She admits to low levels of self esteem as she feels she not smart enough. Last visit at [facility] over 6-8 weeks prior. Reports a follow-up scheduled sometime in January. At present patient  chest pain, abdominal pain, nausea, or new weakness tingling or numbness, cough, or shortness of breath.  Allergies  Allergen Reactions  . Penicillins     UNSPECIFIED REACTION   . Morphine And Related Rash   Past Medical History:  Diagnosis Date  . Anxiety   . Bipolar 1 disorder ([place])    [facility]- Dr. [name].- sees him q 6-8 weeks  . Epilepsy ([place])    TBI- related to seizures - pt. reports that stress flares the seizure activity   . Family history of colon cancer   . Family history of lung cancer   . Family history of ovarian cancer 12/15/2016  . Fibromyalgia   . GERD (gastroesophageal reflux disease)   . Headache    migraines   . History of hiatal hernia   . History of kidney stones    passed spontaneously  . Hypertension    showing ^ BP, pt. relates to anxiety, reports that she has never had tx for BP or any heart related problems.   . Osteoarthritis    everywhere- - hips & hands   . Osteoporosis   . PTSD (post-traumatic stress disorder)   . Sclerosing adenosis of breast, left   . Seizures ([place])    last seizure 10-12-16, petit mal, Dr Roddie Mc aware  . Sleep apnea   . TBI (traumatic brain  injury) ([place])    plate on L side of head   . Thyroid disease    Current Outpatient Medications on File Prior to Visit  Medication Sig Dispense Refill  . busPIRone (BUSPAR) 15 MG tablet Take 10 mg by mouth 3 (three) times daily.     . butorphanol (STADOL) 10 MG/ML nasal spray Place 1 spray into the nose every 4 (four) hours as needed for headache.    . divalproex (DEPAKOTE ER) 500 MG 24 hr tablet Take 1 tablet (500 mg total) by mouth daily. 30  tablet 0  . ibuprofen (ADVIL,MOTRIN) 200 MG tablet Take 200 mg by mouth every 6 (six) hours as needed.    . lurasidone (LATUDA) 40 MG TABS tablet Take 40 mg by mouth daily with supper.     . pregabalin (LYRICA) 225 MG capsule Take 225 mg by mouth daily.    Marland Kitchen topiramate (TOPAMAX) 100 MG tablet Take 1 tablet (100 mg total) by mouth 2 (two) times daily. 60 tablet 0  . traZODone (DESYREL) 100 MG tablet Take 200 mg by mouth at bedtime.     Marland Kitchen venlafaxine XR (EFFEXOR-XR) 75 MG 24 hr capsule Take 75 mg by mouth daily with breakfast.    . [DISCONTINUED] ipratropium (ATROVENT) 0.06 % nasal spray Place 2 sprays into both nostrils 4 (four) times daily. (Patient not taking: Reported on 06/03/2014) 15 mL 1  . [DISCONTINUED] omeprazole (PRILOSEC) 20 MG capsule Take 1 capsule (20 mg total) by mouth daily. (Patient not taking: Reported on 06/03/2014) 30 capsule 0   No current facility-administered medications on file prior to visit.    Family History  Problem Relation Age of Onset  . Ovarian cancer Mother 41       had hysterectomy  . Lung cancer Mother 4  . Diabetes Mellitus II Father   . Other Brother        bladder problem (bleeding), lung scarring  . Ovarian cancer Maternal Aunt 20       had hysterectomy  . Colon cancer Maternal Aunt 73       previously had polyps  . Ovarian cancer Maternal Grandmother 24       'some cells left benind' recurred at 74 and died at 66  . Lung cancer Paternal Grandfather 45       Asbestos exposure  . Colon polyps Cousin 70       had precancerous polyps identified in 73's   Social History   Socioeconomic History  . Marital status: Single    Spouse name: Not on file  . Number of children: Not on file  . Years of education: Not on file  . Highest education level: Not on file  Social Needs  . Financial resource strain: Not on file  . Food insecurity - worry: Not on file  . Food insecurity - inability: Not on file  . Transportation needs - medical: Not on file  .  Transportation needs - non-medical: Not on file  Occupational History  . Not on file  Tobacco Use  . Smoking status: Never Smoker  . Smokeless tobacco: Never Used  Substance and Sexual Activity  . Alcohol use: Yes    Alcohol/week: 0.6 oz    Types: 1 Standard drinks or equivalent per week    Comment: 1 per day  . Drug use: Yes    Types: Cocaine    Comment: 11/08/2016- last use   . Sexual activity: Yes    Partners: Male    Birth  control/protection: Surgical  Other Topics Concern  . Not on file  Social History Narrative   Drinks rare caffeine.    Review of Systems: Constitutional: Negative for fever, chills, diaphoresis, activity change, appetite change and fatigue. HENT: Negative for ear pain, nosebleeds, congestion, facial swelling, rhinorrhea, neck pain, neck stiffness and ear discharge.  Eyes: Negative for pain, discharge, redness, itching and visual disturbance. Respiratory: Negative for cough, choking, chest tightness, shortness of breath, wheezing and stridor.  Cardiovascular: Negative for chest pain, palpitations and leg swelling. Gastrointestinal: Negative for abdominal distention. Genitourinary: Negative for dysuria, urgency, frequency, hematuria, flank pain, decreased urine volume, difficulty urinating and dyspareunia.  Musculoskeletal: Negative for back pain, joint swelling, arthralgia and gait problem. Neurological: Negative for dizziness, tremors, seizures, syncope, facial asymmetry, speech difficulty, weakness, light-headedness, numbness and headaches.  Hematological: Negative for adenopathy. Does not bruise/bleed easily. Psychiatric/Behavioral: Positive for hallucinations, behavioral problems, confusion, positive dysphoric mood, positive decreased concentration and positive agitation.  Objective:   Vitals:   02/01/17 0854  BP: 106/70  Pulse: 80  Resp: 16  Temp: 98.7 F (37.1 C)  SpO2: 98%    Physical Exam: Constitutional: Patient appears well-developed and  well-nourished. No distress. HENT: Normocephalic, atraumatic, External right and left ear normal. Oropharynx is clear and moist.  Eyes: Conjunctivae and EOM are normal. PERRLA, no scleral icterus. Neck: Normal ROM. Neck supple. No JVD. No tracheal deviation. No thyromegaly. CVS: RRR, S1/S2 +, no murmurs, no gallops, no carotid bruit.  Pulmonary: Effort and breath sounds normal, no stridor, rhonchi, wheezes, rales.  Abdominal: Soft. BS +, no distension, tenderness, rebound or guarding.  Musculoskeletal: Normal range of motion. No edema and no tenderness.  Lymphadenopathy: No lymphadenopathy noted, cervical, inguinal or axillary Neuro: Alert. Normal reflexes, muscle tone coordination. No cranial nerve deficit. Skin: Skin is warm and dry. No rash noted. Not diaphoretic. No erythema. No pallor. Psychiatric:Flat affect. Behavior appropriate. Judgment and thought content inappropriate for age. Expressive active auditory hallucination which call her name and awaken her from sleep. Denies voices are frightening or instruct her to perform self harm or harm to others.  Lab Results  Component Value Date   CREATININE 0.78 01/21/2017   BUN 7 01/21/2017   NA 137 01/21/2017   K 4.1 01/21/2017   CL 114 (H) 01/21/2017   CO2 17 (L) 01/21/2017    No results found for: HGBA1C Lipid Panel  No results found for: CHOL, TRIG, HDL, CHOLHDL, VLDL, LDLCALC      Assessment and plan:  1. Valproic acid toxicity, accidental or unintentional, subsequent encounter, last valproic acid level 102 and ammonia 53 at discharge 01/21/2017.  Repeating a valproic acid level, ammonia, and BMP.  Patient does not display any altered mental status, or other neurological abnormalities during today's visit.  Neurological exam was normal.  2. Bipolar 1 disorder (HCC) Currently active, chronic.  No psychotic features observed during today's visit, although patient has mixed mania as well as depressive symptoms with auditory  hallucinations which per her account has been present for several years.  She requires close management and evaluation by a psychiatric specialist.  Advised patient to call [name] to see if a follow-up could be scheduled sooner than January to ensure appropriate medication adherence and management of symptoms.  3. Seizures ([place]), chronic, unspecified status.  Patient would benefit from neurology management and follow-up.  Patient has been given a [place] financial assistance application in order to be referred to a [facility] neurology service.  4. Screening for diabetes  mellitus, A1C 5.2 normal. No recheck indicated for 12 months  5. Encounter for pregnancy test, result unknown, negative urine pregnancy   6. Screening for blood or protein in urine, - Urinalysis, small amount of RBCs present in urine, cloudy appearance with trace of ketones. Patient is not a diabetic.   7. Migraine Headaches, controlled with current medication regimen, Topomax. No changes today.  8. GERD, chronic, on-going,  symptoms previously controlled with omeprazole, will resume omeprazole 40 mg by mouth once daily.   Meds ordered this encounter  Medications  . omeprazole (PRILOSEC) 40 MG capsule    Sig: Take 1 capsule (40 mg total) by mouth daily.    Dispense:  30 capsule    Refill:  3    Order Specific Question:   Supervising Provider    Answer:   Tresa Garter [id number]   Orders Placed This Encounter  Procedures  . Basic metabolic panel  . Ammonia  . Valproic acid level  . Urinalysis  . POCT glycosylated hemoglobin (Hb A1C)  . POCT urine pregnancy    Return in about 6 weeks (around 03/15/2017), BH f/u, check Depakote level, or PAP and Health Maintenance .  The patient was given clear instructions to go to ER or return to medical center if symptoms don't improve, worsen or new problems develop. The patient verbalized understanding. The patient was told to call to get lab results if they haven't  heard anything in the next week.    A total of 45 minutes spent, greater than 50 % of this time was spent reviewing prior medical history, reviewing medications and indications of treatment, prior labs and diagnostic tests, discussing current plan of treatment, health promotion, and goals of treatment.  Carroll Sage. Kenton Kingfisher, MSN, FNP-C [facility]  [address] C[address] [place], [place] [address] [phone]  This note has been created with Dragon speech recognition software and smart phrase technology. Any transcriptional errors are unintentional.

## 2017-02-01 NOTE — Patient Instructions (Addendum)
Complete [place] Patient Assistance and [employer]. Once [facility] Application is completed, I will refer you to Gastroenterology.   Local mental health resources:  [facility]-[address], [place] -go over to there walk-in clinic to be evaluated. If you opt not to go to [facility], schedule a follow-up with Dr. [name] in order to have your Depakote refilled and current dose evaluated.  [facility]-[address], [place], [place] Schedule appointment with early January to follow-up on current symptoms of depression and management of bi-polar disorder.     Suicidal Feelings: How to Help Yourself Suicide is the taking of one's own life. If you feel as though life is getting too tough to handle and are thinking about suicide, get help right away. To get help:  Call your local emergency services (911 in the [place]  Call a suicide hotline to speak with a trained counselor who understands how you are feeling. The following is a list of suicide hotlines in the [place]. For a list of hotlines in [place], visit FindSkins.pl. ? 1-800-273-TALK (641)062-2929). ? 1-800-SUICIDE (518)191-0845). ? 310-206-3888. This is a hotline for Spanish speakers. ? 1-800-799-4TTY 817-595-5002). This is a hotline for TTY users. ? 1-866-4-U-TREVOR (469) 056-1781). This is a hotline for lesbian, gay, bisexual, transgender, or questioning youth.  Contact a crisis center or a local suicide prevention center. To find a crisis center or suicide prevention center: ? Call your local hospital, clinic, community service organization, mental health center, social service provider, or health department. Ask for assistance in connecting to a crisis center. ? Visit BankingRep.com.au for a list of crisis centers in the [place], or visit  www.suicideprevention.ca/thinking-about-suicide/find-a-crisis-centre for a list of centers in [place].  Visit the following websites: ? National Suicide Prevention Lifeline: www.suicidepreventionlifeline.org ? Hopeline: www.hopeline.com ? [employer] for Suicide Prevention: PromotionalLoans.co.za ? The [employer] (for lesbian, gay, bisexual, transgender, or questioning youth): www.thetrevorproject.org  How can I help myself feel better?  Promise yourself that you will not do anything drastic when you have suicidal feelings. Remember, there is hope. Many people have gotten through suicidal thoughts and feelings, and you will, too. You may have gotten through them before, and this proves that you can get through them again.  Let family, friends, teachers, or counselors know how you are feeling. Try not to isolate yourself from those who care about you. Remember, they will want to help you. Talk with someone every day, even if you do not feel sociable. Face-to-face conversation is best.  Call a mental health professional and see one regularly.  Visit your primary health care provider every year.  Eat a well-balanced diet, and space your meals so you eat regularly.  Get plenty of rest.  Avoid alcohol and drugs, and remove them from your home. They will only make you feel worse.  If you are thinking of taking a lot of medicine, give your medicine to someone who can give it to you one day at a time. If you are on antidepressants and are concerned you will overdose, let your health care provider know so he or she can give you safer medicines. Ask your mental health professional about the possible side effects of any medicines you are taking.  Remove weapons, poisons, knives, and anything else that could harm you from your home.  Try to stick to routines. Follow a schedule every day. Put self-care on your schedule.  Make a list of realistic goals, and cross  them off when you achieve them.  Accomplishments give a sense of worth.  Wait until you are feeling better before doing the things you find difficult or unpleasant.  Exercise if you are able. You will feel better if you exercise for even a half hour each day.  Go out in the sun or into nature. This will help you recover from depression faster. If you have a favorite place to walk, go there.  Do the things that have always given you pleasure. Play your favorite music, read a good book, paint a picture, play your favorite instrument, or do anything else that takes your mind off your depression if it is safe to do.  Keep your living space well lit.  When you are feeling well, write yourself a letter about tips and support that you can read when you are not feeling well.  Remember that life's difficulties can be sorted out with help. Conditions can be treated. You can work on thoughts and strategies that serve you well. This information is not intended to replace advice given to you by your health care provider. Make sure you discuss any questions you have with your health care provider. Document Released: 08/09/2002 Document Revised: 10/02/2015 Document Reviewed: 05/30/2013 Elsevier Interactive Patient Education  2017 [employer].

## 2017-02-02 LAB — URINALYSIS
Bilirubin, UA: NEGATIVE
Glucose, UA: NEGATIVE
LEUKOCYTES UA: NEGATIVE
NITRITE UA: NEGATIVE
PH UA: 5.5 (ref 5.0–7.5)
PROTEIN UA: NEGATIVE
Specific Gravity, UA: 1.021 (ref 1.005–1.030)
Urobilinogen, Ur: 1 mg/dL (ref 0.2–1.0)

## 2017-02-02 LAB — BASIC METABOLIC PANEL
BUN / CREAT RATIO: 12 (ref 9–23)
BUN: 11 mg/dL (ref 6–24)
CHLORIDE: 112 mmol/L — AB (ref 96–106)
CO2: 18 mmol/L — ABNORMAL LOW (ref 20–29)
Calcium: 8.8 mg/dL (ref 8.7–10.2)
Creatinine, Ser: 0.91 mg/dL (ref 0.57–1.00)
GFR, EST AFRICAN AMERICAN: 90 mL/min/{1.73_m2} (ref >59)
GFR, EST NON AFRICAN AMERICAN: 78 mL/min/{1.73_m2} (ref >59)
Glucose: 83 mg/dL (ref 65–99)
POTASSIUM: 4 mmol/L (ref 3.5–5.2)
SODIUM: 140 mmol/L (ref 134–144)

## 2017-02-02 LAB — VALPROIC ACID LEVEL: Valproic Acid Lvl: 93 ug/mL (ref 50–100)

## 2017-02-02 LAB — AMMONIA: Ammonia: 58 ug/dL (ref 19–87)

## 2017-02-03 ENCOUNTER — Encounter: Payer: Self-pay | Admitting: Family Medicine

## 2017-02-11 ENCOUNTER — Telehealth: Payer: Self-pay | Admitting: Neurology

## 2017-02-11 ENCOUNTER — Other Ambulatory Visit: Payer: Self-pay | Admitting: Neurology

## 2017-02-11 MED ORDER — LAMOTRIGINE 25 MG PO TABS: ORAL_TABLET | ORAL | 3 refills | Status: DC

## 2017-02-11 NOTE — Telephone Encounter (Signed)
Called the pt ans spoke with her regarding her recent ED visit. Pt states since her dc from hospital she has resumed meds Depakote 500 mg once a day and topamax 100mg  BID. Dr Brett Fairy would like the patient to have valporic acid level and ammonia level drawn. If these come back fine then we can refill these medications. The patient unfortunately is not able to be seen here due to not having insurance. I have requested that she ask if the FNP will be willing to order these test for her so that they can be completed. Pt states she plans to go there on Saturday and she will ask. I made sure she wrote this information down. I have informed her that Dr Brett Fairy isn't familiar with the seizure watch and doesn't feel comfortable ordering that. We will dc the lamictal since the pt is already on those medications.

## 2017-02-11 NOTE — Telephone Encounter (Signed)
Pt has called to inform RN Myriam Jacobson that she has been admitted to [facility] earlier this month because of an accidental over dose of divalproex (DEPAKOTE ER) 500 MG 24 hr tablet.  Pt states Dr took her off all medications, she is currently on nothing at all but has had 4 seziures this month.  She is unable to come in and see Dr Dohmeier due to not having any insurance but she would like to know if there is any kind of assistance that can be offered to her by Dr Brett Fairy  Re: her getting a Seizure watch.  Please call

## 2017-02-11 NOTE — Telephone Encounter (Addendum)
Seizure watch - not something I ma familiar with.  accidental overdose- depakote- she has been on Topiramate , too- which also is a seizure medication.  I will need to review her hospital records.  I would suggest changing to Lamictal, starting titration at 25 mg tabs, I typed the titration schedule. ultimate goal is  100 mg bid and stay on that medication- better for her liver.  Still works for bipolar and mood disorders.

## 2017-02-11 NOTE — Addendum Note (Signed)
Addended by: Larey Seat on: 02/11/2017 01:44 PM   Modules accepted: Orders

## 2017-02-12 ENCOUNTER — Other Ambulatory Visit: Payer: Self-pay

## 2017-02-12 ENCOUNTER — Encounter (HOSPITAL_COMMUNITY): Payer: Self-pay | Admitting: Emergency Medicine

## 2017-02-12 ENCOUNTER — Emergency Department (HOSPITAL_COMMUNITY)
Admission: EM | Admit: 2017-02-12 | Discharge: 2017-02-13 | Disposition: A | Discharge status: Other Acute Inpatient Hospital | Payer: Self-pay | Attending: Emergency Medicine | Admitting: Emergency Medicine

## 2017-02-12 DIAGNOSIS — R45851 Suicidal ideations: Secondary | ICD-10-CM | POA: Insufficient documentation

## 2017-02-12 DIAGNOSIS — F339 Major depressive disorder, recurrent, unspecified: Secondary | ICD-10-CM | POA: Insufficient documentation

## 2017-02-12 DIAGNOSIS — Z79899 Other long term (current) drug therapy: Secondary | ICD-10-CM | POA: Insufficient documentation

## 2017-02-12 DIAGNOSIS — I10 Essential (primary) hypertension: Secondary | ICD-10-CM | POA: Insufficient documentation

## 2017-02-12 LAB — CBC
HCT: 44.1 % (ref 36.0–46.0)
HEMOGLOBIN: 14.8 g/dL (ref 12.0–15.0)
MCH: 31 pg (ref 26.0–34.0)
MCHC: 33.6 g/dL (ref 30.0–36.0)
MCV: 92.5 fL (ref 78.0–100.0)
Platelets: 238 10*3/uL (ref 150–400)
RBC: 4.77 MIL/uL (ref 3.87–5.11)
RDW: 14.1 % (ref 11.5–15.5)
WBC: 10.7 10*3/uL — ABNORMAL HIGH (ref 4.0–10.5)

## 2017-02-12 LAB — RAPID URINE DRUG SCREEN, HOSP PERFORMED
AMPHETAMINES: NOT DETECTED
BARBITURATES: NOT DETECTED
Benzodiazepines: NOT DETECTED
Cocaine: POSITIVE — AB
Opiates: NOT DETECTED
TETRAHYDROCANNABINOL: NOT DETECTED

## 2017-02-12 LAB — I-STAT BETA HCG BLOOD, ED (MC, WL, AP ONLY): I-stat hCG, quantitative: <5 m[IU]/mL (ref <5)
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
## 2017-02-12 NOTE — H&P (Signed)
Behavioral Health Medical Screening Exam  Kristin Braun is an 41 y.o. female.  Total Time spent with patient: 30 minutes  Psychiatric Specialty Exam: Physical Exam  Constitutional: She is oriented to person, place, and time. She appears well-developed and well-nourished. No distress.  HENT:  Head: Normocephalic and atraumatic.  Right Ear: External ear normal.  Left Ear: External ear normal.  Eyes: Conjunctivae are normal. Right eye exhibits no discharge. Left eye exhibits no discharge. No scleral icterus.  Cardiovascular: Normal rate, regular rhythm and normal heart sounds.  Respiratory: Effort normal and breath sounds normal. No respiratory distress.  Musculoskeletal: Normal range of motion.  Neurological: She is alert and oriented to person, place, and time.  Skin: Skin is warm and dry. She is not diaphoretic.  Psychiatric: Her speech is normal. Her mood appears anxious. Her affect is blunt. She is not withdrawn and not actively hallucinating. Thought content is not paranoid and not delusional. Cognition and memory are normal. She expresses impulsivity and inappropriate judgment. She exhibits a depressed mood. She expresses suicidal ideation. She expresses no homicidal ideation. She expresses no suicidal plans.    Review of Systems  Neurological: Positive for seizures. Negative for dizziness and headaches.  Psychiatric/Behavioral: Positive for depression, hallucinations and suicidal ideas. Negative for memory loss and substance abuse. The patient is nervous/anxious and has insomnia.   All other systems reviewed and are negative.   Blood pressure 134/77, pulse 92, temperature 98.4 F (36.9 C), resp. rate 16, last menstrual period 02/01/2017, SpO2 97 %.There is no height or weight on file to calculate BMI.  General Appearance: Casual and Fairly Groomed  Eye Contact:  Fair  Speech:  Clear and Coherent and Normal Rate  Volume:  Normal  Mood:  Anxious, Depressed, Dysphoric, Hopeless,  Irritable and Worthless  Affect:  Blunt and Tearful  Thought Process:  Coherent, Goal Directed and Descriptions of Associations: Intact  Orientation:  Full (Time, Place, and Person)  Thought Content:  Logical and Hallucinations: Auditory  Suicidal Thoughts:  yes, no specific plan. recently overdosed on depakote. Denies that the overdose was intentional.  Homicidal Thoughts:  No  Memory:  Immediate;   Fair Recent;   Fair  Judgement:  Impaired  Insight:  Fair  Psychomotor Activity:  Normal  Concentration: Concentration: Poor and Attention Span: Poor  Recall:  [employer] of Knowledge:Fair  Language: Good  Akathisia:  No  Handed:  Right  AIMS (if indicated):     Assets:  Communication Skills Desire for Improvement Housing Intimacy Leisure Time  Sleep:       Musculoskeletal: Strength & Muscle Tone: within normal limits Gait & Station: normal   Blood pressure 134/77, pulse 92, temperature 98.4 F (36.9 C), resp. rate 16, last menstrual period 02/01/2017, SpO2 97 %.  Recommendations:  Based on my evaluation the patient does not appear to have an emergency medical condition.Due to patient's history of recent seizures and recent admission for Depakote toxicity, will send to [facility] for medical clearance.  Rozetta Nunnery, NP 02/12/2017, 9:23 PM

## 2017-02-12 NOTE — BHH Counselor (Addendum)
Pt accepted to [facility] room 406-1 when medically cleared.  The pt can arrive in the AM.

## 2017-02-12 NOTE — ED Triage Notes (Signed)
Patient states she is feeling suicidal. Patient does not have a plan. Patient states she has been seen at [facility] for a year and the doctor has been upping her medication but it is not working. Patient states she is getting angry and mad. She also states she has been yelling at everybody.

## 2017-02-13 ENCOUNTER — Encounter (HOSPITAL_COMMUNITY): Payer: Self-pay

## 2017-02-13 ENCOUNTER — Other Ambulatory Visit: Payer: Self-pay

## 2017-02-13 ENCOUNTER — Inpatient Hospital Stay (HOSPITAL_COMMUNITY)
Admission: RE | Admit: 2017-02-13 | Discharge: 2017-02-18 | DRG: 885 | Disposition: A | Discharge status: Home / Self Care | Payer: Medicaid Other | Source: Intra-hospital | Attending: Psychiatry | Admitting: Psychiatry

## 2017-02-13 ENCOUNTER — Emergency Department (HOSPITAL_COMMUNITY): Admission: EM | Admit: 2017-02-13 | Discharge: 2017-02-13 | Discharge status: Absent without leave | Payer: MEDICAID

## 2017-02-13 DIAGNOSIS — R45851 Suicidal ideations: Secondary | ICD-10-CM | POA: Diagnosis present

## 2017-02-13 DIAGNOSIS — M797 Fibromyalgia: Secondary | ICD-10-CM | POA: Diagnosis present

## 2017-02-13 DIAGNOSIS — F332 Major depressive disorder, recurrent severe without psychotic features: Principal | ICD-10-CM | POA: Diagnosis present

## 2017-02-13 DIAGNOSIS — Z801 Family history of malignant neoplasm of trachea, bronchus and lung: Secondary | ICD-10-CM | POA: Diagnosis not present

## 2017-02-13 DIAGNOSIS — G4700 Insomnia, unspecified: Secondary | ICD-10-CM | POA: Diagnosis present

## 2017-02-13 DIAGNOSIS — F419 Anxiety disorder, unspecified: Secondary | ICD-10-CM

## 2017-02-13 DIAGNOSIS — K219 Gastro-esophageal reflux disease without esophagitis: Secondary | ICD-10-CM | POA: Diagnosis present

## 2017-02-13 DIAGNOSIS — F3161 Bipolar disorder, current episode mixed, mild: Secondary | ICD-10-CM | POA: Diagnosis present

## 2017-02-13 DIAGNOSIS — Z87820 Personal history of traumatic brain injury: Secondary | ICD-10-CM

## 2017-02-13 DIAGNOSIS — Z79899 Other long term (current) drug therapy: Secondary | ICD-10-CM

## 2017-02-13 DIAGNOSIS — G4730 Sleep apnea, unspecified: Secondary | ICD-10-CM | POA: Diagnosis present

## 2017-02-13 DIAGNOSIS — F3160 Bipolar disorder, current episode mixed, unspecified: Secondary | ICD-10-CM | POA: Diagnosis not present

## 2017-02-13 DIAGNOSIS — R440 Auditory hallucinations: Secondary | ICD-10-CM | POA: Diagnosis present

## 2017-02-13 DIAGNOSIS — F3162 Bipolar disorder, current episode mixed, moderate: Secondary | ICD-10-CM | POA: Diagnosis not present

## 2017-02-13 DIAGNOSIS — R443 Hallucinations, unspecified: Secondary | ICD-10-CM

## 2017-02-13 DIAGNOSIS — Z818 Family history of other mental and behavioral disorders: Secondary | ICD-10-CM | POA: Diagnosis not present

## 2017-02-13 DIAGNOSIS — Z23 Encounter for immunization: Secondary | ICD-10-CM | POA: Diagnosis not present

## 2017-02-13 DIAGNOSIS — F4310 Post-traumatic stress disorder, unspecified: Secondary | ICD-10-CM | POA: Diagnosis present

## 2017-02-13 DIAGNOSIS — I10 Essential (primary) hypertension: Secondary | ICD-10-CM | POA: Diagnosis present

## 2017-02-13 DIAGNOSIS — M810 Age-related osteoporosis without current pathological fracture: Secondary | ICD-10-CM | POA: Diagnosis present

## 2017-02-13 DIAGNOSIS — G40409 Other generalized epilepsy and epileptic syndromes, not intractable, without status epilepticus: Secondary | ICD-10-CM | POA: Diagnosis present

## 2017-02-13 DIAGNOSIS — F1410 Cocaine abuse, uncomplicated: Secondary | ICD-10-CM | POA: Diagnosis not present

## 2017-02-13 DIAGNOSIS — R450 Nervousness: Secondary | ICD-10-CM | POA: Diagnosis not present

## 2017-02-13 DIAGNOSIS — Z8041 Family history of malignant neoplasm of ovary: Secondary | ICD-10-CM

## 2017-02-13 DIAGNOSIS — T426X1A Poisoning by other antiepileptic and sedative-hypnotic drugs, accidental (unintentional), initial encounter: Secondary | ICD-10-CM

## 2017-02-13 DIAGNOSIS — Z800 Family history of malignant neoplasm of digestive organs: Secondary | ICD-10-CM | POA: Diagnosis not present

## 2017-02-13 DIAGNOSIS — Z811 Family history of alcohol abuse and dependence: Secondary | ICD-10-CM | POA: Diagnosis not present

## 2017-02-13 DIAGNOSIS — F39 Unspecified mood [affective] disorder: Secondary | ICD-10-CM | POA: Diagnosis not present

## 2017-02-13 LAB — VALPROIC ACID LEVEL
VALPROIC ACID LVL: 397 ug/mL — AB (ref 50.0–100.0)
Valproic Acid Lvl: 146 ug/mL — ABNORMAL HIGH (ref 50.0–100.0)

## 2017-02-13 LAB — COMPREHENSIVE METABOLIC PANEL
ALBUMIN: 4.1 g/dL (ref 3.5–5.0)
ALBUMIN: 3.7 g/dL (ref 3.5–5.0)
ALK PHOS: 53 U/L (ref 38–126)
ALT: 16 U/L (ref 14–54)
ALT: 18 U/L (ref 14–54)
ANION GAP: 10 (ref 5–15)
AST: 23 U/L (ref 15–41)
AST: 21 U/L (ref 15–41)
Alkaline Phosphatase: 46 U/L (ref 38–126)
Anion gap: 9 (ref 5–15)
BILIRUBIN TOTAL: 0.4 mg/dL (ref 0.3–1.2)
BILIRUBIN TOTAL: 0.8 mg/dL (ref 0.3–1.2)
BUN: 9 mg/dL (ref 6–20)
BUN: 12 mg/dL (ref 6–20)
CALCIUM: 8.7 mg/dL — AB (ref 8.9–10.3)
CHLORIDE: 110 mmol/L (ref 101–111)
CO2: 19 mmol/L — AB (ref 22–32)
CO2: 19 mmol/L — ABNORMAL LOW (ref 22–32)
CREATININE: 1.09 mg/dL — AB (ref 0.44–1.00)
Calcium: 8.3 mg/dL — ABNORMAL LOW (ref 8.9–10.3)
Chloride: 108 mmol/L (ref 101–111)
Creatinine, Ser: 1.03 mg/dL — ABNORMAL HIGH (ref 0.44–1.00)
GFR calc Af Amer: >60 mL/min (ref >60)
GFR calc Af Amer: >60 mL/min (ref >60)
GFR calc non Af Amer: >60 mL/min (ref >60)
GFR calc non Af Amer: >60 mL/min (ref >60)
GLUCOSE: 92 mg/dL (ref 65–99)
GLUCOSE: 87 mg/dL (ref 65–99)
POTASSIUM: 3.8 mmol/L (ref 3.5–5.1)
Potassium: 3.7 mmol/L (ref 3.5–5.1)
SODIUM: 137 mmol/L (ref 135–145)
Sodium: 138 mmol/L (ref 135–145)
TOTAL PROTEIN: 7.4 g/dL (ref 6.5–8.1)
Total Protein: 6.4 g/dL — ABNORMAL LOW (ref 6.5–8.1)

## 2017-02-13 LAB — CBC WITH DIFFERENTIAL/PLATELET
BASOS ABS: 0 10*3/uL (ref 0.0–0.1)
BASOS PCT: 0 %
EOS ABS: 0 10*3/uL (ref 0.0–0.7)
EOS PCT: 0 %
HCT: 41.6 % (ref 36.0–46.0)
Hemoglobin: 14.1 g/dL (ref 12.0–15.0)
LYMPHS PCT: 60 %
Lymphs Abs: 3.1 10*3/uL (ref 0.7–4.0)
MCH: 31.3 pg (ref 26.0–34.0)
MCHC: 33.9 g/dL (ref 30.0–36.0)
MCV: 92.4 fL (ref 78.0–100.0)
Monocytes Absolute: 0.1 10*3/uL (ref 0.1–1.0)
Monocytes Relative: 2 %
Neutro Abs: 1.9 10*3/uL (ref 1.7–7.7)
Neutrophils Relative %: 38 %
PLATELETS: 185 10*3/uL (ref 150–400)
RBC: 4.5 MIL/uL (ref 3.87–5.11)
RDW: 14.1 % (ref 11.5–15.5)
WBC: 5.1 10*3/uL (ref 4.0–10.5)

## 2017-02-13 LAB — SALICYLATE LEVEL
Salicylate Lvl: <7 mg/dL (ref 2.8–30.0)
Salicylate Lvl: <7 mg/dL (ref 2.8–30.0)

## 2017-02-13 LAB — ACETAMINOPHEN LEVEL: Acetaminophen (Tylenol), Serum: 11 ug/mL (ref 10–30)

## 2017-02-13 LAB — I-STAT BETA HCG BLOOD, ED (MC, WL, AP ONLY)

## 2017-02-13 LAB — LIPID PANEL
Cholesterol: 141 mg/dL (ref 0–200)
HDL: 40 mg/dL — AB (ref >40)
LDL CALC: 83 mg/dL (ref 0–99)
TRIGLYCERIDES: 91 mg/dL (ref <150)
Total CHOL/HDL Ratio: 3.5 RATIO
VLDL: 18 mg/dL (ref 0–40)

## 2017-02-13 LAB — HEMOGLOBIN A1C
HEMOGLOBIN A1C: 5.1 % (ref 4.8–5.6)
MEAN PLASMA GLUCOSE: 99.67 mg/dL

## 2017-02-13 LAB — TSH: TSH: 1 u[IU]/mL (ref 0.350–4.500)

## 2017-02-13 LAB — AMMONIA: AMMONIA: 48 umol/L — AB (ref 9–35)

## 2017-02-13 LAB — ETHANOL: Alcohol, Ethyl (B): <10 mg/dL (ref <10)

## 2017-02-13 MED ORDER — ALUM & MAG HYDROXIDE-SIMETH 200-200-20 MG/5ML PO SUSP: 30.0000 mL | Freq: Four times a day (QID) | ORAL | Status: DC | PRN

## 2017-02-13 MED ORDER — DIVALPROEX SODIUM ER 500 MG PO TB24
500.0000 mg | ORAL_TABLET | Freq: Every day | ORAL | Status: DC
  Administered 2017-02-13: 500 mg via ORAL
  Filled 2017-02-13 (×2): qty 1

## 2017-02-13 MED ORDER — VENLAFAXINE HCL ER 75 MG PO CP24
75.0000 mg | ORAL_CAPSULE | Freq: Every day | ORAL | Status: DC
  Administered 2017-02-13: 75 mg via ORAL
  Filled 2017-02-13 (×2): qty 1

## 2017-02-13 MED ORDER — LURASIDONE HCL 40 MG PO TABS: 40.0000 mg | ORAL_TABLET | Freq: Every day | ORAL | Status: DC

## 2017-02-13 MED ORDER — SODIUM CHLORIDE 0.9 % IV BOLUS (SEPSIS)
1000.0000 mL | Freq: Once | INTRAVENOUS | Status: AC
  Administered 2017-02-13: 1000 mL via INTRAVENOUS

## 2017-02-13 MED ORDER — METOCLOPRAMIDE HCL 5 MG/ML IJ SOLN
10.0000 mg | Freq: Once | INTRAMUSCULAR | Status: AC
  Administered 2017-02-13: 10 mg via INTRAVENOUS
  Filled 2017-02-13: qty 2

## 2017-02-13 MED ORDER — INFLUENZA VAC SPLIT QUAD 0.5 ML IM SUSY
0.5000 mL | PREFILLED_SYRINGE | INTRAMUSCULAR | Status: AC
  Administered 2017-02-15: 0.5 mL via INTRAMUSCULAR
  Filled 2017-02-13: qty 0.5

## 2017-02-13 MED ORDER — LURASIDONE HCL 40 MG PO TABS
40.0000 mg | ORAL_TABLET | Freq: Every day | ORAL | Status: DC
  Filled 2017-02-13: qty 1

## 2017-02-13 MED ORDER — ACETAMINOPHEN 325 MG PO TABS: 650.0000 mg | ORAL_TABLET | ORAL | Status: DC | PRN

## 2017-02-13 MED ORDER — MAGNESIUM HYDROXIDE 400 MG/5ML PO SUSP
30.0000 mL | Freq: Every day | ORAL | Status: DC | PRN
  Administered 2017-02-16 – 2017-02-17 (×2): 30 mL via ORAL
  Filled 2017-02-13 (×2): qty 30

## 2017-02-13 MED ORDER — DIPHENHYDRAMINE HCL 25 MG PO CAPS
50.0000 mg | ORAL_CAPSULE | Freq: Once | ORAL | Status: AC
  Administered 2017-02-13: 50 mg via ORAL
  Filled 2017-02-13: qty 2

## 2017-02-13 MED ORDER — ALUM & MAG HYDROXIDE-SIMETH 200-200-20 MG/5ML PO SUSP
30.0000 mL | ORAL | Status: DC | PRN
  Administered 2017-02-17: 30 mL via ORAL
  Filled 2017-02-13: qty 30

## 2017-02-13 MED ORDER — BUSPIRONE HCL 10 MG PO TABS
10.0000 mg | ORAL_TABLET | Freq: Three times a day (TID) | ORAL | Status: DC
  Administered 2017-02-13: 10 mg via ORAL
  Filled 2017-02-13 (×4): qty 1

## 2017-02-13 MED ORDER — BUSPIRONE HCL 10 MG PO TABS: 10.0000 mg | ORAL_TABLET | Freq: Three times a day (TID) | ORAL | Status: DC

## 2017-02-13 MED ORDER — HYDROXYZINE HCL 25 MG PO TABS
25.0000 mg | ORAL_TABLET | Freq: Three times a day (TID) | ORAL | Status: DC | PRN
  Administered 2017-02-17: 25 mg via ORAL
  Filled 2017-02-13: qty 10
  Filled 2017-02-13 (×2): qty 1

## 2017-02-13 MED ORDER — TOPIRAMATE 100 MG PO TABS
100.0000 mg | ORAL_TABLET | Freq: Two times a day (BID) | ORAL | Status: DC
  Administered 2017-02-13: 100 mg via ORAL
  Filled 2017-02-13: qty 1

## 2017-02-13 MED ORDER — ACETAMINOPHEN 325 MG PO TABS
650.0000 mg | ORAL_TABLET | Freq: Four times a day (QID) | ORAL | Status: DC | PRN
  Administered 2017-02-13 – 2017-02-18 (×2): 650 mg via ORAL
  Filled 2017-02-13 (×2): qty 2

## 2017-02-13 MED ORDER — TOPIRAMATE 100 MG PO TABS
100.0000 mg | ORAL_TABLET | Freq: Two times a day (BID) | ORAL | Status: DC
  Administered 2017-02-13 – 2017-02-18 (×12): 100 mg via ORAL
  Filled 2017-02-13 (×11): qty 1
  Filled 2017-02-13: qty 14
  Filled 2017-02-13 (×4): qty 1
  Filled 2017-02-13: qty 14
  Filled 2017-02-13 (×3): qty 1

## 2017-02-13 MED ORDER — TRAZODONE HCL 50 MG PO TABS: 50.0000 mg | ORAL_TABLET | Freq: Every evening | ORAL | Status: DC | PRN

## 2017-02-13 MED ORDER — VENLAFAXINE HCL ER 75 MG PO CP24: 75.0000 mg | ORAL_CAPSULE | Freq: Every day | ORAL | Status: DC

## 2017-02-13 MED ORDER — DIVALPROEX SODIUM ER 500 MG PO TB24: 500.0000 mg | ORAL_TABLET | Freq: Every day | ORAL | Status: DC

## 2017-02-13 NOTE — Progress Notes (Signed)
D  Per instruction from Dr. Parke Poisson, arrangements made to send pt non emergency to [facility] ED for elevated depakote level ( 397). Per pt's request, writer called her mother to notify  [place] [phone])  and no one answered.Writer left voicemail message at this number requesting they call [phone] for update.  Writer notified AC Brunetta Jeans) of poc, as well as [facility] to accompany pt ( JW). Pt's nurse called verbal report to ED charge and called non emergency 911 for transport. Per Dr. Parke Poisson, he spoke with [facility] MD Dr. Wilson Singer. Pt tearful, anxious, contracts with Probation officer for safety.

## 2017-02-13 NOTE — Progress Notes (Signed)
Patient has returned from the ED. Patient is ambulating steady on her feet and in no acute distress. Patient is resting in bed and denies concerns at this time. Will continue to monitor.
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
## 2017-02-13 NOTE — Progress Notes (Signed)
Per Langley Gauss from [employer], patient has been cleared.

## 2017-02-13 NOTE — Progress Notes (Signed)
Patient did not attend wrap up group. 

## 2017-02-13 NOTE — Tx Team (Signed)
Initial Treatment Plan 02/13/2017 4:07 AM Kristin Braun TWK:[id number]    PATIENT STRESSORS: Financial difficulties Health problems Medication change or noncompliance Substance abuse   PATIENT STRENGTHS: Communication skills Supportive family/friends   PATIENT IDENTIFIED PROBLEMS: "feel better about myself"  "not need to depend on everyone"  Suicide Risk  Substance Abuse- cocaine  Depression             DISCHARGE CRITERIA:  Ability to meet basic life and health needs Adequate post-discharge living arrangements Improved stabilization in mood, thinking, and/or behavior Medical problems require only outpatient monitoring Motivation to continue treatment in a less acute level of care Need for constant or close observation no longer present Reduction of life-threatening or endangering symptoms to within safe limits Safe-care adequate arrangements made Verbal commitment to aftercare and medication compliance Withdrawal symptoms are absent or subacute and managed without 24-hour nursing intervention  PRELIMINARY DISCHARGE PLAN: Outpatient therapy  PATIENT/FAMILY INVOLVEMENT: This treatment plan has been presented to and reviewed with the patient, Kristin Braun.  The patient and family have been given the opportunity to ask questions and make suggestions.  Annia Friendly, RN 02/13/2017, 4:07 AM

## 2017-02-13 NOTE — Progress Notes (Signed)
Patient - ambulatory- transported via EMS to [facility] ED

## 2017-02-13 NOTE — ED Notes (Signed)
ED Provider at bedside. EDP [place] PRESENT

## 2017-02-13 NOTE — Progress Notes (Signed)
D. Pt in bed resting with eyes closed upon initial approach. Pt presents with an anxious affect and congruent behavior- complaining of migraine 8/10 -unrelieved by tylenol. Lab called with critical value for valproic acid level of 397. Dr notified of critical value. Pt completed self inventory- rating her depression, hopelessness and anxiety an 8/8/8, respectively. Pt reports that her most important goal today is "being able to relax". "Not feel scared or anxious". Patient currently denies SI/HI.  A. Labs and vitals monitored. Pt compliant with medications. Pt supported emotionally and encouraged to express concerns and ask questions.   R. Pt remains safe with 15 minute checks. Will continue POC.

## 2017-02-13 NOTE — ED Notes (Signed)
WARM COMPRESS GIVEN FOR COMFORT 

## 2017-02-13 NOTE — ED Triage Notes (Signed)
Per [facility]. Pt is currently at [facility] being treated for high anxiety. Denies SI/HI. Abnormal lab Depakote 397. C/o of migraine today. Pt received there yesterday. VSS.
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
## 2017-02-13 NOTE — ED Provider Notes (Signed)
Was seen by Dr. Regenia Skeeter earlier.  Please see his note.  Patient's repeat Depakote level still pending.  It is down to 146 now.  He discussed with poison control earlier.  No further treatment is necessary.  Patient is stable for discharge back to behavioral health.   Dorie Rank, MD 02/13/17 [phone]

## 2017-02-13 NOTE — Progress Notes (Signed)
Patient presents a letter to Probation officer she would like the doctor to have. Writer made a copy. Original letter given back to patient and placed in her folder. Copy of letter placed in chart.

## 2017-02-13 NOTE — ED Notes (Signed)
[facility] HERE TO TRANSFER PT BACK TO [facility].

## 2017-02-13 NOTE — Progress Notes (Signed)
Spoke with EMS- non emergency - on their way. ED Charge nurse, Patty,  notified of patient coming

## 2017-02-13 NOTE — ED Notes (Signed)
Report given to [facility], [place] called for transport, will take VS prior to transfer. Caralynn Gelber P Creig Landin

## 2017-02-13 NOTE — Progress Notes (Signed)
Nursing Progress Note [id number]  D) Patient presents with sad/sullen mood and is anxious. Patient denies concerns for writer this evening. Patient did not attend group. Patient is isolative to room. Patient denies SI/HI/AVH or pain this evening. Patient contracts for safety on the unit. Patient vital signs obtained and WNL. No medications scheduled/requested this evening. Patient to have valproic acid level drawn in the morning. PO fluids pushed.  A) Emotional support given. 1:1 interaction and active listening provided. Snacks and fluids provided. Opportunities for questions or concerns presented to patient. Patient encouraged to continue to work on treatment goals. Labs, vital signs and patient behavior monitored throughout shift. Patient safety maintained with q15 min safety checks. High fall risk precautions in place and reviewed with patient; patient verbalized understanding.  R) Patient receptive to interaction with nurse. Patient remains safe on the unit at this time. Patient is resting in bed without complaints. Will continue to monitor.

## 2017-02-13 NOTE — [facility] Group Notes (Signed)
[facility] Group Notes: (Clinical Social Work)   02/13/2017      Type of Therapy:  Group Therapy   Participation Level:  Did Not Attend despite MHT prompting   Selmer Dominion, LCSW 02/13/2017, 11:25 AM
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
## 2017-02-14 LAB — AMMONIA: AMMONIA: 27 umol/L (ref 9–35)

## 2017-02-14 LAB — VALPROIC ACID LEVEL: VALPROIC ACID LVL: 80 ug/mL (ref 50.0–100.0)

## 2017-02-14 MED ORDER — ARIPIPRAZOLE 5 MG PO TABS
5.0000 mg | ORAL_TABLET | Freq: Every day | ORAL | Status: DC
  Administered 2017-02-14 – 2017-02-15 (×2): 5 mg via ORAL
  Filled 2017-02-14 (×4): qty 1

## 2017-02-14 MED ORDER — MIRTAZAPINE 7.5 MG PO TABS
7.5000 mg | ORAL_TABLET | Freq: Every day | ORAL | Status: DC
  Administered 2017-02-14: 7.5 mg via ORAL
  Filled 2017-02-14 (×2): qty 1

## 2017-02-14 NOTE — Progress Notes (Signed)
Adult Psychoeducational Group Note  Date:  02/14/2017 Time:  10:26 PM  Group Topic/Focus:  Wrap-Up Group:   The focus of this group is to help patients review their daily goal of treatment and discuss progress on daily workbooks.  Participation Level:  Active  Participation Quality:  Appropriate  Affect:  Appropriate  Cognitive:  Appropriate  Insight: Appropriate  Engagement in Group:  Engaged  Modes of Intervention:  Discussion, Education and Socialization  Additional Comments:  Pt attended this evening's wrap up group and rated her day at a 7 out of 10. Pt's goal was to participate more throughout the day. Pt stated she was talking and sharing with others, which contributed to having a good day.  Rocky Crafts 02/14/2017, 10:26 PM

## 2017-02-14 NOTE — Progress Notes (Signed)
D. Pt observed interacting appropriately in milieu and actively participating in group. Per pt's self inventory- pt rates her depression, hopelessness and anxiety a 2/2/2, respectively. Pt reports having slept poorly last night and writes that her most important goal of the day is "to be active" and to "stay up".  Pt currently denies SI/HI and AVH. A. Labs and vitals monitored. Pt compliant with medications. Pt supported emotionally and encouraged to express concerns and ask questions.   R. Pt remains safe with 15 minute checks. Will continue POC.

## 2017-02-14 NOTE — [facility] Group Notes (Signed)
[name] LCSW Group Therapy Note  Date/Time:  02/14/2017 10:00-11:00AM  Type of Therapy and Topic:  Group Therapy:  Healthy and Unhealthy Supports  Participation Level:  Active   Description of Group:  Patients in this group were introduced to the idea of adding a variety of healthy supports to address the various needs in their lives. The picture on the front of Sunday's workbook was used to demonstrate why more supports are needed in every patient's life.  Patients identified and described healthy supports versus unhealthy supports in general, then gave examples of each in their own lives.   They discussed what additional healthy supports could be helpful in their recovery and wellness after discharge in order to prevent future hospitalizations.   An emphasis was placed on using counselor, doctor, therapy groups, 12-step groups, and problem-specific support groups to expand supports.  We also talked about how to deal with unhealthy supports through boundary-setting, psychoeducation with loved ones, and even termination of relationships.   Therapeutic Goals:   1)  discuss importance of adding supports to stay well once out of the hospital  2)  compare healthy versus unhealthy supports and identify some examples of each  3)  generate ideas and descriptions of healthy supports that can be added  4)  offer mutual support about how to address unhealthy supports  5)  encourage active participation in and adherence to discharge plan    Summary of Patient Progress:  The patient shared that she feels guilty about needing support from her adult children, because she should be the mother and not the one in need.  She listened attentively but with tears at all that was said throughout group.  She particularly responded to the idea of setting boundaries.   Therapeutic Modalities:   Motivational Interviewing Brief Solution-Focused Therapy  Selmer Dominion, LCSW
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
## 2017-02-14 NOTE — Plan of Care (Signed)
  Progressing Safety: Periods of time without injury will increase 02/14/2017 2313 - Progressing by Karie Kirks, RN Note Pt has not harmed self or others tonight.  She denies SI/HI and verbally contracts for safety.
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
## 2017-02-14 NOTE — Progress Notes (Signed)
D: Pt was in bed in her room upon initial approach.  Pt presents with depressed affect and mood.  She describes her day as "so so, I didn't sleep good at all last night."  Her goal today was to "stay awake, participate" and pt reports she met goal.  Pt denies SI/HI, denies hallucinations, denies pain.  Pt has been visible in milieu with few peer interactions.  Pt attended evening group.    A: Introduced self to pt.  Actively listened to pt and offered support and encouragement. Medication administered per order.  Q15 minute safety checks maintained.  R: Pt is safe on the unit.  Pt is compliant with medication.  Pt verbally contracts for safety.  Will continue to monitor and assess.

## 2017-02-15 DIAGNOSIS — F332 Major depressive disorder, recurrent severe without psychotic features: Principal | ICD-10-CM

## 2017-02-15 DIAGNOSIS — F39 Unspecified mood [affective] disorder: Secondary | ICD-10-CM

## 2017-02-15 DIAGNOSIS — G4700 Insomnia, unspecified: Secondary | ICD-10-CM

## 2017-02-15 MED ORDER — QUETIAPINE FUMARATE 50 MG PO TABS
50.0000 mg | ORAL_TABLET | Freq: Every day | ORAL | Status: DC
  Administered 2017-02-15: 50 mg via ORAL
  Filled 2017-02-15 (×2): qty 1

## 2017-02-15 NOTE — Progress Notes (Signed)
D: Patient states she continues to have some insomnia and rates her sleep here "fair."  She has complained of some agitation today and her medications were changed by the provider.  Patient has been isolative to her room today.  Her goal today is "to be involved."  She rates her depression, hopelessness and anxiety as a 4.  She is pleasant with staff.  She denies any thoughts of self harm.    A: Continue to monitor medication management and MD orders.  Safety checks continued every 15 minutes per protocol.  Offer support and encouragement as needed.  R: Patient is receptive to staff; her behavior is appropriate.
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
## 2017-02-15 NOTE — Progress Notes (Signed)
Recreation Therapy Notes  Date: 02/15/17 Time: 0930 Location: [address] Dayroom  Group Topic: Stress Management  Goal Area(s) Addresses:  Patient will verbalize importance of using healthy stress management.  Patient will identify positive emotions associated with healthy stress management.   Behavioral Response: Engaged  Intervention: Stress Management  Activity :  Guided Imagery.  LRT introduced the stress management technique of guided imagery.  LRT read a script on letting go of things that hold [name] back.  Patients were to listen and follow along as LRT read script to engage in activity.  Education:  Stress Management, Discharge Planning.   Education Outcome: Acknowledges edcuation/In group clarification offered/Needs additional education  Clinical Observations/Feedback: Pt attended group.     Kristin Braun, LRT/CTRS         Victorino Sparrow A 02/15/2017 11:25 AM

## 2017-02-15 NOTE — Tx Team (Signed)
Interdisciplinary Treatment and Diagnostic Plan Update  02/15/2017 Time of Session: [place] MRN: [id number]  Principal Diagnosis: <principal problem not specified>  Secondary Diagnoses: Active Problems:   Severe recurrent major depression without psychotic features (HCC)   Current Medications:  Current Facility-Administered Medications  Medication Dose Route Frequency Provider Last Rate Last Dose  . acetaminophen (TYLENOL) tablet 650 mg  650 mg Oral Q6H PRN Lindon Romp A, NP   650 mg at 02/13/17 0854  . alum & mag hydroxide-simeth (MAALOX/MYLANTA) 200-200-20 MG/5ML suspension 30 mL  30 mL Oral Q4H PRN Lindon Romp A, NP      . hydrOXYzine (ATARAX/VISTARIL) tablet 25 mg  25 mg Oral TID PRN Lindon Romp A, NP      . magnesium hydroxide (MILK OF MAGNESIA) suspension 30 mL  30 mL Oral Daily PRN Lindon Romp A, NP      . QUEtiapine (SEROQUEL) tablet 50 mg  50 mg Oral QHS Money, Travis B, FNP      . topiramate (TOPAMAX) tablet 100 mg  100 mg Oral BID Lindon Romp A, NP   100 mg at 02/15/17 0840   PTA Medications: Medications Prior to Admission  Medication Sig Dispense Refill Last Dose  . busPIRone (BUSPAR) 15 MG tablet Take 10 mg by mouth 3 (three) times daily.    02/04/2017  . butorphanol (STADOL) 10 MG/ML nasal spray Place 1 spray into the nose every 4 (four) hours as needed for headache.   Past Month at Unknown time  . divalproex (DEPAKOTE ER) 500 MG 24 hr tablet Take 1 tablet (500 mg total) by mouth daily. 30 tablet 0 02/11/2017 at Unknown time  . ibuprofen (ADVIL,MOTRIN) 200 MG tablet Take 800 mg by mouth every 6 (six) hours as needed for headache, mild pain or moderate pain.    02/12/2017 at Unknown time  . lurasidone (LATUDA) 40 MG TABS tablet Take 40 mg by mouth daily with supper.    02/10/2017  . omeprazole (PRILOSEC) 40 MG capsule Take 1 capsule (40 mg total) by mouth daily. 30 capsule 3 02/12/2017 at Unknown time  . topiramate (TOPAMAX) 100 MG tablet Take 1 tablet  (100 mg total) by mouth 2 (two) times daily. 60 tablet 0 02/12/2017 at Unknown time  . traZODone (DESYREL) 100 MG tablet Take 200 mg by mouth at bedtime.    02/10/2017  . venlafaxine XR (EFFEXOR-XR) 75 MG 24 hr capsule Take 75 mg by mouth daily with breakfast.   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   Physician Treatment Plan for Primary Diagnosis: <principal problem not specified> Long Term Goal(s): Improvement in symptoms so as ready for discharge Improvement in symptoms so as ready for discharge   Short Term Goals: Ability to identify changes in lifestyle to reduce recurrence of condition will improve Ability to maintain clinical measurements within normal limits will improve patient will be transported to [facility] ED for appropriate management  Medication Management: Evaluate patient's response, side effects, and tolerance of medication regimen.  Therapeutic Interventions: 1 to 1 sessions, Unit Group sessions and Medication administration.  Evaluation of Outcomes: Progressing  Physician Treatment Plan for Secondary Diagnosis: Active Problems:   Severe recurrent major depression without psychotic features ([place])  Long Term Goal(s): Improvement in symptoms so as ready for discharge Improvement in  symptoms so as ready for discharge   Short Term Goals: Ability to identify changes in lifestyle to reduce recurrence of condition will improve Ability to maintain clinical measurements within normal limits will improve patient will be transported to [facility] ED for appropriate management     Medication Management: Evaluate patient's response, side effects, and tolerance of medication  regimen.  Therapeutic Interventions: 1 to 1 sessions, Unit Group sessions and Medication administration.  Evaluation of Outcomes: Progressing   RN Treatment Plan for Primary Diagnosis: <principal problem not specified> Long Term Goal(s): Knowledge of disease and therapeutic regimen to maintain health will improve  Short Term Goals: Ability to identify and develop effective coping behaviors will improve and Compliance with prescribed medications will improve  Medication Management: RN will administer medications as ordered by provider, will assess and evaluate patient's response and provide education to patient for prescribed medication. RN will report any adverse and/or side effects to prescribing provider.  Therapeutic Interventions: 1 on 1 counseling sessions, Psychoeducation, Medication administration, Evaluate responses to treatment, Monitor vital signs and CBGs as ordered, Perform/monitor CIWA, COWS, AIMS and Fall Risk screenings as ordered, Perform wound care treatments as ordered.  Evaluation of Outcomes: Progressing   LCSW Treatment Plan for Primary Diagnosis: <principal problem not specified> Long Term Goal(s): Safe transition to appropriate next level of care at discharge, Engage patient in therapeutic group addressing interpersonal concerns.  Short Term Goals: Engage patient in aftercare planning with referrals and resources, Increase social support and Increase skills for wellness and recovery  Therapeutic Interventions: Assess for all discharge needs, 1 to 1 time with Social worker, Explore available resources and support systems, Assess for adequacy in community support network, Educate family and significant other(s) on suicide prevention, Complete Psychosocial Assessment, Interpersonal group therapy.  Evaluation of Outcomes: Progressing   Progress in Treatment: Attending groups: Yes. Participating in groups: Yes. Taking medication as prescribed: Yes. Toleration  medication: Yes. Family/Significant other contact made: No, will contact:  mother Patient understands diagnosis: Yes. Discussing patient identified problems/goals with staff: Yes. Medical problems stabilized or resolved: Yes. Denies suicidal/homicidal ideation: Yes. Issues/concerns per patient self-inventory: No. Other: none  New problem(s) identified: No, Describe:  none  New Short Term/Long Term Goal(s):  Discharge Plan or Barriers:   Reason for Continuation of Hospitalization: Depression Medication stabilization  Estimated Length of Stay: 3-5 days.  Attendees: Patient: 02/15/2017   Physician: Dr. Parke Poisson, MD 02/15/2017   Nursing: Mayra Neer, RN 02/15/2017   RN Care Manager: 02/15/2017   Social Worker: Lurline Idol, LCSW 02/15/2017   Recreational Therapist:  02/15/2017   Other:  02/15/2017   Other:  02/15/2017   Other: 02/15/2017        Scribe for Treatment Team: Joanne Chars, [place] 02/15/2017 11:06 AM

## 2017-02-16 MED ORDER — QUETIAPINE FUMARATE 100 MG PO TABS
100.0000 mg | ORAL_TABLET | Freq: Every day | ORAL | Status: DC
  Administered 2017-02-16: 100 mg via ORAL
  Filled 2017-02-16 (×3): qty 1

## 2017-02-16 MED ORDER — ONDANSETRON 4 MG PO TBDP
4.0000 mg | ORAL_TABLET | Freq: Three times a day (TID) | ORAL | Status: DC | PRN
  Administered 2017-02-16 – 2017-02-17 (×2): 4 mg via ORAL
  Filled 2017-02-16 (×2): qty 1

## 2017-02-16 NOTE — [facility] Suicide Risk Assessment (Signed)
Kristin Braun INPATIENT:  Family/Significant Other Suicide Prevention Education  Suicide Preven tion Education:  Contact Attempts:  York Ram, mom, [phone], has been identified by the patient as the family member/significant other with whom the patient will be residing, and identified as the person(s) who will aid the patient in the event of a mental health crisis.  With written consent from the patient, two attempts were made to provide suicide prevention education, prior to and/or following the patient's discharge.  We were unsuccessful in providing suicide prevention education.  A suicide education pamphlet was given to the patient to share with family/significant other.  Date and time of first attempt:02/16/17, 1241 Date and time of second attempt:  Joanne Chars, LCSW 02/16/2017, 12:43 PM
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
## 2017-02-16 NOTE — Progress Notes (Signed)
D: Pt denies SI/HI/AVH. Pt is pleasant and cooperative. Pt stated she was doing better, she talked with her mom and she has plan in place to help her deal with stress . Pt plans to possibly get disability, this has pt feeling better so she could possibly afford her medications  A: Pt was offered support and encouragement. Pt was given scheduled medications. Pt was encourage to attend groups. Q 15 minute checks were done for safety.   R:Pt attends groups and interacts well with peers and staff. Pt is taking medication. Pt has no complaints.Pt receptive to treatment and safety maintained on unit.

## 2017-02-16 NOTE — Plan of Care (Signed)
  Progressing Activity: Sleeping patterns will improve 02/16/2017 1521 - Progressing by Joice Lofts, RN Education: Emotional status will improve 02/16/2017 1521 - Progressing by Joice Lofts, RN Mental status will improve 02/16/2017 1521 - Progressing by Joice Lofts, RN Coping: Ability to verbalize frustrations and anger appropriately will improve 02/16/2017 1521 - Progressing by Joice Lofts, RN Coping: Ability to identify and develop effective coping behavior will improve 02/16/2017 1521 - Progressing by Joice Lofts, RN Ability to interact with others will improve 02/16/2017 1521 - Progressing by Joice Lofts, RN Participation in decision-making will improve 02/16/2017 1521 - Progressing by Joice Lofts, RN Self-Concept: Ability to verbalize positive feelings about self will improve 02/16/2017 1521 - Progressing by Joice Lofts, RN
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
## 2017-02-16 NOTE — Progress Notes (Signed)
Child/Adolescent Psychoeducational Group Note  Date:  02/16/2017 Time:  10:52 PM  Group Topic/Focus:  Wrap-Up Group:   The focus of this group is to help patients review their daily goal of treatment and discuss progress on daily workbooks.  Participation Level:  Active  Participation Quality:  Appropriate, Attentive and Sharing  Affect:  Appropriate  Cognitive:  Alert and Appropriate  Insight:  Appropriate  Engagement in Group:  Engaged  Modes of Intervention:  Discussion and Support  Additional Comments: Pt states she didn't know about morning group. Pt states she didn't have a goal and had a migraine earlier. Pt rates her day 7/10. Pt states she found out about her d/c date.   Terrial Rhodes 02/16/2017, 10:52 PM

## 2017-02-16 NOTE — Progress Notes (Signed)
D: Patient is bright and pleasant this morning.  She states she slept well last night.  Her depressive symptoms have improved and her goal is to "go home."  She rates her depression and hopelessness as a 2; anxiety as a 7.  Patient denies any thoughts of self harm.  Patient states that her father is a good support for her.    A: Continue to monitor medication management and MD orders.  Safety checks continued every 15 minutes per protocol.  Offer support and encouragement as needed.  R: Patient is receptive to staff; her behavior is appropriate.

## 2017-02-17 MED ORDER — QUETIAPINE FUMARATE 200 MG PO TABS
200.0000 mg | ORAL_TABLET | Freq: Every day | ORAL | Status: DC
Start: 2017-02-17 — End: 2017-02-18
  Administered 2017-02-17: 200 mg via ORAL
  Filled 2017-02-17: qty 7
  Filled 2017-02-17 (×4): qty 1

## 2017-02-17 NOTE — Progress Notes (Signed)
Pt cx of indigestion. She says she has been told she has GERD and has taken "something that starts with an O" in the past with no relief. Maalox requested and given.

## 2017-02-17 NOTE — Progress Notes (Signed)
Patient with labile mood; in AM she said that she felt much better but then became anxious and upset in response to the agitation of a peer. Later complained of heartburn and nausea which were somewhat relieved by medication. Denies SI/HI/AVH and is looking forward to implementing discharge plan.

## 2017-02-17 NOTE — B[facility] Suicide Risk Assessment (Signed)
[place] INPATIENT:  Family/Significant Other Suicide Prevention Education  Suicide Prevention Education:  Education Completed; York Ram, mother, [phone], has been identified by the patient as the family member/significant other with whom the patient will be residing, and identified as the person(s) who will aid the patient in the event of a mental health crisis (suicidal ideations/suicide attempt).  With written consent from the patient, the family member/significant other has been provided the following suicide prevention education, prior to the and/or following the discharge of the patient.  The suicide prevention education provided includes the following:  Suicide risk factors  Suicide prevention and interventions  National Suicide Hotline telephone number  [facility] assessment telephone number  [facility] Emergency Assistance [place] and/or Residential Mobile Crisis Unit telephone number  Request made of family/significant other to:  Remove weapons (e.g., guns, rifles, knives), all items previously/currently identified as safety concern.  No guns in the home, per Maudie Mercury.  Remove drugs/medications (over-the-counter, prescriptions, illicit drugs), all items previously/currently identified as a safety concern.   Maudie Mercury will check on medication supply and see if there is old or excess around.  The family member/significant other verbalizes understanding of the suicide prevention education information provided.  The family member/significant other agrees to remove the items of safety concern listed above.  Pt lives in a house right next door to her mother, who has lots of contact and will continue to monitor.  Joanne Chars , LCSW 02/17/2017, 2:48 PM

## 2017-02-17 NOTE — Progress Notes (Signed)
The patient expressed in group that she learned a great deal today and that she can't help everyone. She did not elaborate any further. Her goal for tomorrow is to get discharged and if necessary, wait until she is ready.

## 2017-02-17 NOTE — Progress Notes (Signed)
Recreation Therapy Notes  Date: 02/17/17 Time: 0930 Location: [address] Dayroom  Group Topic: Stress Management  Goal Area(s) Addresses:  Patient will verbalize importance of using healthy stress management.  Patient will identify positive emotions associated with healthy stress management.   Intervention: Stress Management  Activity :  Body Scan Meditation.  LRT introduced the stress management technique of meditation.  LRT played a script that guided patients through a body scan that allowed patients to become aware of any sensations they may have been experiencing.  Education:  Stress Management, Discharge Planning.   Education Outcome: Acknowledges edcuation/In group clarification offered/Needs additional education  Clinical Observations/Feedback: Pt did not attend group.     Victorino Sparrow, LRT/CTRS         Victorino Sparrow A 02/17/2017 1:09 PM

## 2017-02-17 NOTE — Progress Notes (Signed)
D: Pt was in the dayroom upon initial approach.  Pt presents with appropriate affect and mood.  She reports her day was "good" and she is "ready to go."  Her goal today was "to find out when I was going home, they said to see how things go and if I do well I think I'll leave the next day or so."  She reports she feels safe to discharge tomorrow and "everything is set up at home" for aftercare.  Pt denies SI/HI, denies hallucinations, denies pain.  Pt has been visible in milieu interacting with peers and staff appropriately.    A: Introduced self to pt.  Actively listened to pt and offered support and encouragement. Medication administered per order.  Q15 minute safety checks maintained.  R: Pt is safe on the unit.  Pt is compliant with medication.  Pt verbally contracts for safety.  Will continue to monitor and assess.

## 2017-02-17 NOTE — Plan of Care (Signed)
  Progressing Safety: Ability to remain free from injury will improve 02/17/2017 2255 - Progressing by Karie Kirks, RN Note Pt has not harmed self or others tonight.  She denies SI/HI and verbally contracts for safety.

## 2017-02-17 NOTE — B[facility] Group Notes (Signed)
[facility] Group Therapy 02/17/2017 1:15pm  Type of Therapy: Mental Health Association Presentation  Participation Level: Active  Participation Quality: Attentive  Affect: Appropriate  Cognitive: Oriented  Insight: Developing/Improving  Engagement in Therapy: Engaged  Modes of Intervention: Discussion, Education and Socialization  Summary of Progress/Problems: [place] Speaker came to talk about his personal journey with mental health. The pt processed ways by which to relate to the speaker. [place] speaker provided handouts and educational information pertaining to groups and services offered by the [facility]. Pt was engaged in speaker's presentation and was receptive to resources provided.    Joanne Chars, LCSW 02/17/2017 4:13 PM
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
## 2017-02-18 DIAGNOSIS — F3160 Bipolar disorder, current episode mixed, unspecified: Secondary | ICD-10-CM

## 2017-02-18 DIAGNOSIS — F1410 Cocaine abuse, uncomplicated: Secondary | ICD-10-CM

## 2017-02-18 MED ORDER — TOPIRAMATE 100 MG PO TABS: 100.0000 mg | ORAL_TABLET | Freq: Two times a day (BID) | ORAL | 0 refills | Status: DC

## 2017-02-18 MED ORDER — HYDROXYZINE HCL 25 MG PO TABS: 25.0000 mg | ORAL_TABLET | Freq: Three times a day (TID) | ORAL | 0 refills | Status: DC | PRN

## 2017-02-18 MED ORDER — BISACODYL 5 MG PO TBEC
5.0000 mg | DELAYED_RELEASE_TABLET | Freq: Every day | ORAL | Status: DC | PRN
  Administered 2017-02-18: 5 mg via ORAL
  Filled 2017-02-18: qty 1

## 2017-02-18 MED ORDER — QUETIAPINE FUMARATE 200 MG PO TABS: 200.0000 mg | ORAL_TABLET | Freq: Every day | ORAL | 0 refills | Status: DC

## 2017-02-18 NOTE — Progress Notes (Signed)
Pt discharged home with her mother. Pt was ambulatory, stable and appreciative at that time. All papers and prescriptions were given and valuables returned. Verbal understanding expressed. Denies SI/HI and A/VH. Pt given opportunity to express concerns and ask questions.

## 2017-02-18 NOTE — Progress Notes (Signed)
Pt had a seizure on the hallway on  her way back from the cafeteria for lunch. V/S taken; BP 131/92; P 105; Temp 98.5 and R. 20. Pt assessed, stated this happens a lot since she has seizure disorder. Pt stated she was fine after having her seat down for awhile and was assisted to her room. Will continue to monitor.
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
## 2017-02-18 NOTE — Progress Notes (Signed)
  [facility] Adult Case Management Discharge Plan :  Will you be returning to the same living situation after discharge:  Yes,  own home At discharge, do you have transportation home?: Yes,  parents Do you have the ability to pay for your medications: No. Will use [facility] pharmacy.  Release of information consent forms completed and in the chart;  Patient's signature needed at discharge.  Patient to Follow up at: Follow-up Information    [facility]. Go on 02/26/2017.   Why:  Please attend your follow up appointment on Friday, 02/27/16 at 3:00pm.  Please bring a copy of your hospital discharge paperwork. Contact information: [address] [place] [address] [phone]           Next level of care provider has access to Dent and Suicide Prevention discussed: Yes,  with mother  Have you used any form of tobacco in the last 30 days? (Cigarettes, Smokeless Tobacco, Cigars, and/or Pipes): No  Has patient been referred to the Quitline?: N/A patient is not a smoker  Patient has been referred for addiction treatment: Yes  Joanne Chars, [place] 02/18/2017, 11:33 AM

## 2017-02-18 NOTE — Progress Notes (Signed)
CSW spoke with pt regarding follow up provider.  CSW had contacted [facility] outpt regarding the [facility] financial assistance/charity care request by pt.  CSW was informed by [place] that once pt is approved she will get a letter which she needs to bring them which will detail how much of the bill will be covered and how much pt will be responsible for.  CSW discussed this with pt who reports she has applied but has not heard yet from financial assistance if she has been approved of not.  Pt agreeable to go to [facility] for follow up now and she can contact [facility] outpt once she is approved. Winferd Humphrey, MSW, LCSW Clinical Social Worker 02/18/2017 8:25 AM

## 2017-02-18 NOTE — [facility] Suicide Risk Assessment (Signed)
[facility] Discharge Suicide Risk Assessment   Principal Problem: Bipolar I disorder, most recent episode mixed [facility]) Discharge Diagnoses:  Patient Active Problem List   Diagnosis Date Noted  . Bipolar I disorder, most recent episode mixed ([place]) [F31.60] 02/13/2017  . 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 GERD (gastroesophageal reflux disease) [K21.9] 01/20/2017  . Hyperammonemia ([place]) [E72.20] 01/20/2017  . Acute gastroenteritis [K52.9] 01/20/2017  . Syncope and collapse [R55] 01/20/2017  . Dehydration [E86.0]   . Diarrhea [R19.7]   . Genetic testing [Z13.79] 12/24/2016  . 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 Family history of ovarian cancer [Z80.41] 12/15/2016  . Family history of colon cancer [Z80.0]   . Family history of lung cancer [Z80.1]   . Bipolar I disorder ([place]) [F31.9] 11/02/2015  . Chronic migraine [G43.709] 11/02/2015  . Pre-syncope [R55] 11/01/2015  . Orthostatic hypotension [I95.1] 11/01/2015  . TBI (traumatic brain injury) ([place]) [S06.9X9A]   . Seizures ([place]) [R56.9]   . Headache [R51]     Total Time spent with patient: 45 minutes  Musculoskeletal: Strength & Muscle Tone: within normal limits Gait & Station: normal Patient leans: N/A  Psychiatric Specialty Exam: Review of Systems  Constitutional: Negative.   HENT: Negative.   Eyes: Negative.   Respiratory: Negative.   Cardiovascular: Negative.   Gastrointestinal: Negative.   Genitourinary: Negative.   Musculoskeletal: Negative.   Skin: Negative.   Neurological: Negative.   Endo/Heme/Allergies: Negative.   Psychiatric/Behavioral: Negative for depression, hallucinations, memory loss, substance abuse and suicidal ideas. The patient is not nervous/anxious and does not have insomnia.     Blood pressure 109/79, pulse 93, temperature 98 F (36.7 C), temperature source Oral, resp. rate 16, height '5\' 3"'$  (1.6 m), weight 79 kg (174 lb 2.6 oz), last menstrual period 02/01/2017, SpO2 100  %.Body mass index is 30.85 kg/m.  General Appearance: Neatly dressed, pleasant, engaging well and cooperative. Appropriate behavior. Not in any distress. Good relatedness. Not internally stimulated  Eye Contact::  Good  Speech:  Spontaneous, normal prosody. Normal tone and rate.   Volume:  Normal  Mood:  Euthymic  Affect:  Appropriate and Full Range  Thought Process:  Linear  Orientation:  Full (Time, Place, and Person)  Thought Content:  Future oriented. No delusional theme. No preoccupation with violent thoughts. No negative ruminations. No obsession.  No hallucination in any modality.   Suicidal Thoughts:  No  Homicidal Thoughts:  No  Memory:  Immediate;   Good Recent;   Good Remote;   Good  Judgement:  Good  Insight:  Good  Psychomotor Activity:  Normal  Concentration:  Good  Recall:  Good  Fund of Knowledge:Good  Language: Good  Akathisia:  Negative   Handed:    AIMS (if indicated):     Assets:  Communication Skills Desire for Improvement Housing Resilience  Sleep:  Number of Hours: 6.5  Cognition: WNL  ADL's:  Intact   Clinical  Assessment::    42 y.o Caucasian female, single, lives with her son. Not able to work due to seizure disorder. Background history of TBI associated with seizure disorder and mood disorder.  Referred by her PCP on account of mood swings associated with suicidal thoughts. She was not able to sleep at home she had been picking her skin as she was very anxious. She reported emotionally charged relationship with her son. Patient was switched to a mood stabilizer during her hospital stay.   Chart reviewed today. Patient discussed at team today. She  is reported to have done well with medication adjustment. She is now sleeping well. She is interacting well with peers. . No behavioral issues. Patient has not voiced any suicidal thoughts. Patient has not been observed to be internally stimulated. No seizures during her hospital stay.  I met with her today.  Patient tells me that she feels remarkably well. Says she is no longer irritable. She is now sleeping well at night. Says her thoughts are no longer all over the place. She is now able to think clearly. Says she has not had any suicidal thoughts. She is hoping to get her disability approved later this month. Seen today. Reports that she is in good spirits. Not feeling depressed. Reports normal energy and interest. She has been maintaining normal biological functions. No dejavu phenomena. No hallucination in any modality. He is not making any delusional statement. No passivity of will/thought. She is fully in touch with reality. No thoughts of homicide. No violent thoughts. No overwhelming anxiety.no access to weapons.    Demographic Factors:  Caucasian and Unemployed  Loss Factors: Decline in physical health and Financial problems/change in socioeconomic status  Historical Factors: Impulsivity  Risk Reduction Factors:   Sense of responsibility to family, Living with another person, especially a relative, Positive social support, Positive therapeutic relationship and Positive coping skills or problem solving skills  Continued Clinical Symptoms:  As above   Cognitive Features That Contribute To Risk:  None    Suicide Risk:  Minimal: No identifiable suicidal ideation.  Patient is not having any thoughts of suicide at this time. Modifiable risk factors targeted during this admission includes mood swings. Demographical and historical risk factors cannot be modified. Patient is now engaging well. Patient is reliable and is future oriented. We have buffered patient's support structures. At this point, patient is at low risk of suicide. Patient is aware of the effects of psychoactive substances on decision making process. Patient has been provided with emergency contacts. Patient acknowledges to use resources provided if unforseen circumstances changes their current risk stratification.    Follow-up  Information    [facility]. Go on 02/26/2017.   Why:  Please attend your follow up appointment on Friday, 02/27/16 at 3:00pm.  Please bring a copy of your hospital discharge paperwork. Contact information: [address] [place] [address] [phone]           Plan Of Care/Follow-up recommendations:  1. Continue current psychotropic medications 2. Mental health and addiction follow up as arranged.  3. Discharge in care of her family 4. Provided limited quantity of prescriptions   [place], MD 02/18/2017, 11:35 AM

## 2017-02-19 ENCOUNTER — Telehealth: Payer: Self-pay | Admitting: Neurology

## 2017-02-19 NOTE — Telephone Encounter (Signed)
Pt has called for RN Myriam Jacobson to inform there that on 12-05 she was admitted to [facility] and in order for the medical records to be submitted to Dr Dohmeier they need a release of medical records sent to them.  Pt also wanted RN Myriam Jacobson to be advised she was admitted to [facility] on 12-29 and her Depakote levels were checked at that time.  Pt is asking for a call back

## 2017-03-08 ENCOUNTER — Telehealth: Payer: Self-pay | Admitting: Neurology

## 2017-03-08 NOTE — Telephone Encounter (Signed)
Pt request refill for butorphanol sent to [facility]

## 2017-03-08 NOTE — Telephone Encounter (Signed)
accidential overdose was with depakote.  Still her liver enzymes were highly elevated.  I would not like to fill stadol after this event.

## 2017-03-15 ENCOUNTER — Ambulatory Visit: Payer: Self-pay | Admitting: Family Medicine

## 2017-03-23 ENCOUNTER — Encounter: Payer: Self-pay | Admitting: Neurology

## 2017-03-31 ENCOUNTER — Encounter: Payer: Self-pay | Admitting: Family Medicine

## 2017-03-31 ENCOUNTER — Telehealth: Payer: Self-pay | Admitting: Neurology

## 2017-03-31 ENCOUNTER — Ambulatory Visit (INDEPENDENT_AMBULATORY_CARE_PROVIDER_SITE_OTHER): Payer: Self-pay | Admitting: Family Medicine

## 2017-03-31 VITALS — BP 110/86 | HR 100 | Temp 98.0°F | Resp 16 | Ht 63.0 in | Wt 181.0 lb

## 2017-03-31 DIAGNOSIS — Z23 Encounter for immunization: Secondary | ICD-10-CM

## 2017-03-31 DIAGNOSIS — F319 Bipolar disorder, unspecified: Secondary | ICD-10-CM

## 2017-03-31 DIAGNOSIS — Z01419 Encounter for gynecological examination (general) (routine) without abnormal findings: Secondary | ICD-10-CM

## 2017-03-31 DIAGNOSIS — R102 Pelvic and perineal pain: Secondary | ICD-10-CM

## 2017-03-31 LAB — POCT URINALYSIS DIP (DEVICE)
Bilirubin Urine: NEGATIVE
Glucose, UA: NEGATIVE mg/dL
KETONES UR: NEGATIVE mg/dL
LEUKOCYTES UA: NEGATIVE
Nitrite: NEGATIVE
Protein, ur: NEGATIVE mg/dL
Urobilinogen, UA: 0.2 mg/dL (ref 0.0–1.0)
pH: 5.5 (ref 5.0–8.0)
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
## 2017-04-04 ENCOUNTER — Telehealth: Payer: Self-pay | Admitting: Family Medicine

## 2017-04-04 NOTE — Telephone Encounter (Signed)
Please schedule vaginal and pelvic ultrasound

## 2017-04-05 LAB — PAPIG, HPV, RFX 16/18: PAP SMEAR COMMENT: 0

## 2017-04-05 LAB — PAPIG HPV, CTNG AGE GDLN ACOG

## 2017-04-05 LAB — HPV, LOW VOLUME (REFLEX): HPV, LOW VOL REFLEX: NEGATIVE

## 2017-04-05 NOTE — Telephone Encounter (Signed)
Patient notified and will make appointment on 04/08/2017 for [name].

## 2017-04-06 ENCOUNTER — Encounter: Payer: Self-pay | Admitting: Family Medicine

## 2017-04-07 ENCOUNTER — Ambulatory Visit (HOSPITAL_COMMUNITY)
Admission: RE | Admit: 2017-04-07 | Discharge: 2017-04-07 | Disposition: A | Discharge status: Home / Self Care | Payer: Self-pay | Source: Ambulatory Visit | Attending: Family Medicine | Admitting: Family Medicine

## 2017-04-07 ENCOUNTER — Telehealth: Payer: Self-pay | Admitting: Family Medicine

## 2017-04-07 DIAGNOSIS — N83209 Unspecified ovarian cyst, unspecified side: Secondary | ICD-10-CM | POA: Insufficient documentation

## 2017-04-07 DIAGNOSIS — Z01419 Encounter for gynecological examination (general) (routine) without abnormal findings: Secondary | ICD-10-CM | POA: Insufficient documentation

## 2017-04-07 DIAGNOSIS — N941 Unspecified dyspareunia: Secondary | ICD-10-CM | POA: Insufficient documentation

## 2017-04-07 DIAGNOSIS — N926 Irregular menstruation, unspecified: Secondary | ICD-10-CM | POA: Insufficient documentation

## 2017-04-07 NOTE — Telephone Encounter (Signed)
Contact patient to advise that recent vaginal ultrasound did not reveal a reason for her complaint of pain with sexual intercourse. Overall her vaginal ultrasound was completely normal however there was a benign cyst noted on her endometrium this requires no further evaluation, treatment ,or follow-up.  I do recommend follow-up with a gynecologist if this problem continues to persist however she should notify us once she has health insurance or received approval for the [facility] assistance program as I can then refer her to a gynecology office.   Carroll Sage. Kenton Kingfisher, MSN, FNP-C [facility]  [address] [place], [place] [address] [phone]

## 2017-04-08 ENCOUNTER — Telehealth: Payer: Self-pay

## 2017-04-08 NOTE — Telephone Encounter (Signed)
Patient notified

## 2017-04-08 NOTE — Telephone Encounter (Signed)
Left a vm for patient to callback 

## 2017-04-14 NOTE — Telephone Encounter (Signed)
Error

## 2017-04-16 ENCOUNTER — Telehealth: Payer: Self-pay

## 2017-04-16 NOTE — Telephone Encounter (Signed)
Patient has the [facility] assistance and would like to be referred to gastro.

## 2017-04-20 NOTE — Addendum Note (Signed)
Addended by: Scot Jun on: 04/20/2017 01:11 PM   Modules accepted: Orders

## 2017-04-20 NOTE — Telephone Encounter (Signed)
Patient has been referred to gynecology for further evaluation of chronic lower pelvic pain. Please notify patient

## 2017-04-22 NOTE — Telephone Encounter (Signed)
Referral processed.

## 2017-05-28 ENCOUNTER — Encounter: Payer: Self-pay | Admitting: Obstetrics & Gynecology

## 2017-07-15 ENCOUNTER — Ambulatory Visit: Payer: Medicaid Other | Admitting: Family Medicine

## 2017-07-20 ENCOUNTER — Ambulatory Visit (INDEPENDENT_AMBULATORY_CARE_PROVIDER_SITE_OTHER): Payer: Self-pay | Admitting: Family Medicine

## 2017-07-20 ENCOUNTER — Encounter: Payer: Self-pay | Admitting: Family Medicine

## 2017-07-20 VITALS — BP 130/92 | HR 94 | Temp 97.7°F | Ht 63.0 in | Wt 190.0 lb

## 2017-07-20 DIAGNOSIS — Z0000 Encounter for general adult medical examination without abnormal findings: Secondary | ICD-10-CM

## 2017-07-20 DIAGNOSIS — Z8669 Personal history of other diseases of the nervous system and sense organs: Secondary | ICD-10-CM

## 2017-07-20 DIAGNOSIS — K5900 Constipation, unspecified: Secondary | ICD-10-CM

## 2017-07-20 DIAGNOSIS — R635 Abnormal weight gain: Secondary | ICD-10-CM

## 2017-07-20 DIAGNOSIS — I10 Essential (primary) hypertension: Secondary | ICD-10-CM

## 2017-07-20 DIAGNOSIS — G43909 Migraine, unspecified, not intractable, without status migrainosus: Secondary | ICD-10-CM

## 2017-07-20 DIAGNOSIS — F419 Anxiety disorder, unspecified: Secondary | ICD-10-CM

## 2017-07-20 DIAGNOSIS — R1011 Right upper quadrant pain: Secondary | ICD-10-CM

## 2017-07-20 DIAGNOSIS — M797 Fibromyalgia: Secondary | ICD-10-CM

## 2017-07-20 DIAGNOSIS — R232 Flushing: Secondary | ICD-10-CM

## 2017-07-20 DIAGNOSIS — G2581 Restless legs syndrome: Secondary | ICD-10-CM

## 2017-07-20 DIAGNOSIS — K219 Gastro-esophageal reflux disease without esophagitis: Secondary | ICD-10-CM

## 2017-07-20 DIAGNOSIS — Z09 Encounter for follow-up examination after completed treatment for conditions other than malignant neoplasm: Secondary | ICD-10-CM

## 2017-07-20 LAB — POCT URINALYSIS DIP (MANUAL ENTRY)
Bilirubin, UA: NEGATIVE
Blood, UA: NEGATIVE
Glucose, UA: NEGATIVE mg/dL
Ketones, POC UA: NEGATIVE mg/dL
Leukocytes, UA: NEGATIVE
Nitrite, UA: NEGATIVE
Protein Ur, POC: NEGATIVE mg/dL
Spec Grav, UA: >=1.03 — AB (ref 1.010–1.025)
Urobilinogen, UA: 0.2 E.U./dL
pH, UA: 5 (ref 5.0–8.0)

## 2017-07-20 LAB — POCT GLYCOSYLATED HEMOGLOBIN (HGB A1C): Hemoglobin A1C: 5.1 % (ref 4.0–5.6)

## 2017-07-20 MED ORDER — OMEPRAZOLE 20 MG PO CPDR: 20.0000 mg | DELAYED_RELEASE_CAPSULE | Freq: Every day | ORAL | 0 refills | Status: DC

## 2017-07-20 MED ORDER — SENNOSIDES-DOCUSATE SODIUM 8.6-50 MG PO TABS: 1.0000 | ORAL_TABLET | Freq: Every day | ORAL | 0 refills | Status: DC
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
## 2017-07-21 LAB — CBC WITH DIFFERENTIAL/PLATELET
Basophils Absolute: 0 10*3/uL (ref 0.0–0.2)
Basos: 0 %
EOS (ABSOLUTE): 0.2 10*3/uL (ref 0.0–0.4)
Eos: 2 %
Hematocrit: 44.4 % (ref 34.0–46.6)
Hemoglobin: 14.6 g/dL (ref 11.1–15.9)
Immature Grans (Abs): 0 10*3/uL (ref 0.0–0.1)
Immature Granulocytes: 0 %
Lymphocytes Absolute: 3.4 10*3/uL — ABNORMAL HIGH (ref 0.7–3.1)
Lymphs: 43 %
MCH: 28.9 pg (ref 26.6–33.0)
MCHC: 32.9 g/dL (ref 31.5–35.7)
MCV: 88 fL (ref 79–97)
Monocytes Absolute: 0.3 10*3/uL (ref 0.1–0.9)
Monocytes: 4 %
Neutrophils Absolute: 4 10*3/uL (ref 1.4–7.0)
Neutrophils: 51 %
Platelets: 227 10*3/uL (ref 150–450)
RBC: 5.05 x10E6/uL (ref 3.77–5.28)
RDW: 14.4 % (ref 12.3–15.4)
WBC: 7.9 10*3/uL (ref 3.4–10.8)

## 2017-07-21 LAB — LIPID PANEL
Chol/HDL Ratio: 3.8 ratio (ref 0.0–4.4)
Cholesterol, Total: 196 mg/dL (ref 100–199)
HDL: 52 mg/dL (ref >39)
LDL Calculated: 112 mg/dL — ABNORMAL HIGH (ref 0–99)
Triglycerides: 161 mg/dL — ABNORMAL HIGH (ref 0–149)
VLDL Cholesterol Cal: 32 mg/dL (ref 5–40)

## 2017-07-21 LAB — COMPREHENSIVE METABOLIC PANEL
ALT: 39 IU/L — ABNORMAL HIGH (ref 0–32)
AST: 25 IU/L (ref 0–40)
Albumin/Globulin Ratio: 2 (ref 1.2–2.2)
Albumin: 4.4 g/dL (ref 3.5–5.5)
Alkaline Phosphatase: 65 IU/L (ref 39–117)
BUN/Creatinine Ratio: 20 (ref 9–23)
BUN: 18 mg/dL (ref 6–24)
Bilirubin Total: 0.3 mg/dL (ref 0.0–1.2)
CO2: 16 mmol/L — ABNORMAL LOW (ref 20–29)
Calcium: 9.3 mg/dL (ref 8.7–10.2)
Chloride: 107 mmol/L — ABNORMAL HIGH (ref 96–106)
Creatinine, Ser: 0.92 mg/dL (ref 0.57–1.00)
GFR calc Af Amer: 89 mL/min/{1.73_m2} (ref >59)
GFR calc non Af Amer: 77 mL/min/{1.73_m2} (ref >59)
Globulin, Total: 2.2 g/dL (ref 1.5–4.5)
Glucose: 98 mg/dL (ref 65–99)
Potassium: 4.6 mmol/L (ref 3.5–5.2)
Sodium: 138 mmol/L (ref 134–144)
Total Protein: 6.6 g/dL (ref 6.0–8.5)

## 2017-07-21 LAB — TSH: TSH: 4.76 u[IU]/mL — ABNORMAL HIGH (ref 0.450–4.500)

## 2017-07-23 ENCOUNTER — Other Ambulatory Visit: Payer: Medicaid Other

## 2017-07-23 ENCOUNTER — Other Ambulatory Visit: Payer: Self-pay

## 2017-07-23 ENCOUNTER — Encounter: Payer: Self-pay | Admitting: Neurology

## 2017-07-23 ENCOUNTER — Ambulatory Visit (INDEPENDENT_AMBULATORY_CARE_PROVIDER_SITE_OTHER): Payer: Self-pay | Admitting: Neurology

## 2017-07-23 VITALS — BP 118/84 | HR 92 | Ht 63.0 in | Wt 188.0 lb

## 2017-07-23 DIAGNOSIS — Z87820 Personal history of traumatic brain injury: Secondary | ICD-10-CM

## 2017-07-23 DIAGNOSIS — G629 Polyneuropathy, unspecified: Secondary | ICD-10-CM

## 2017-07-23 DIAGNOSIS — G43109 Migraine with aura, not intractable, without status migrainosus: Secondary | ICD-10-CM

## 2017-07-23 DIAGNOSIS — G40209 Localization-related (focal) (partial) symptomatic epilepsy and epileptic syndromes with complex partial seizures, not intractable, without status epilepticus: Secondary | ICD-10-CM

## 2017-07-23 IMAGING — MG DIGITAL SCREENING BILATERAL MAMMOGRAM WITH CAD
4 series · 4 of 4 positions shown · non-contrast
Comparison: None.

CLINICAL DATA: Screening.

EXAM:
DIGITAL SCREENING BILATERAL MAMMOGRAM WITH CAD

[L CC]
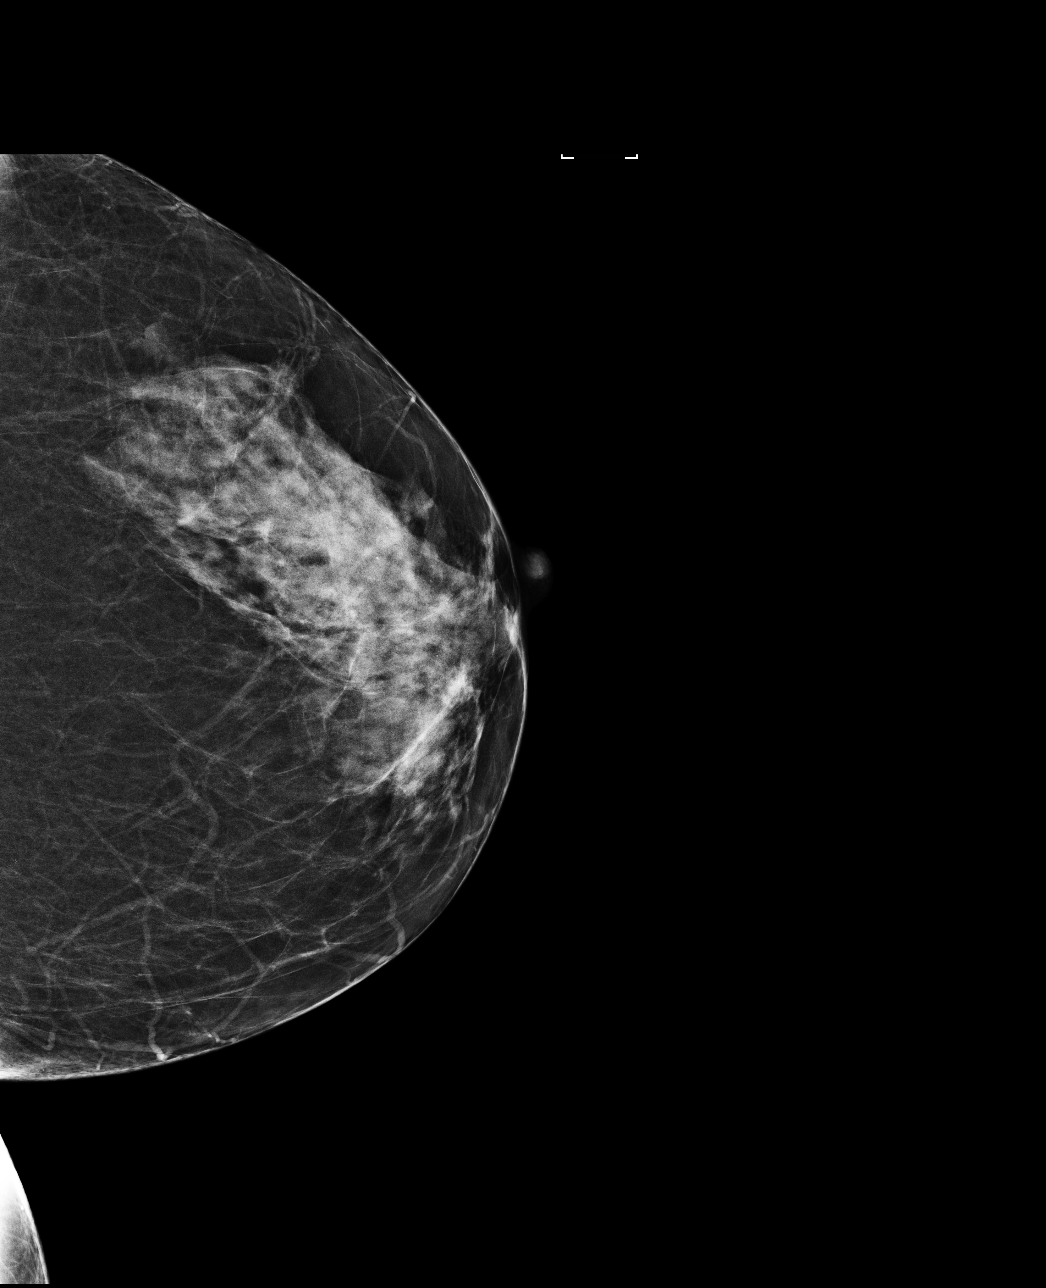

[R MLO]
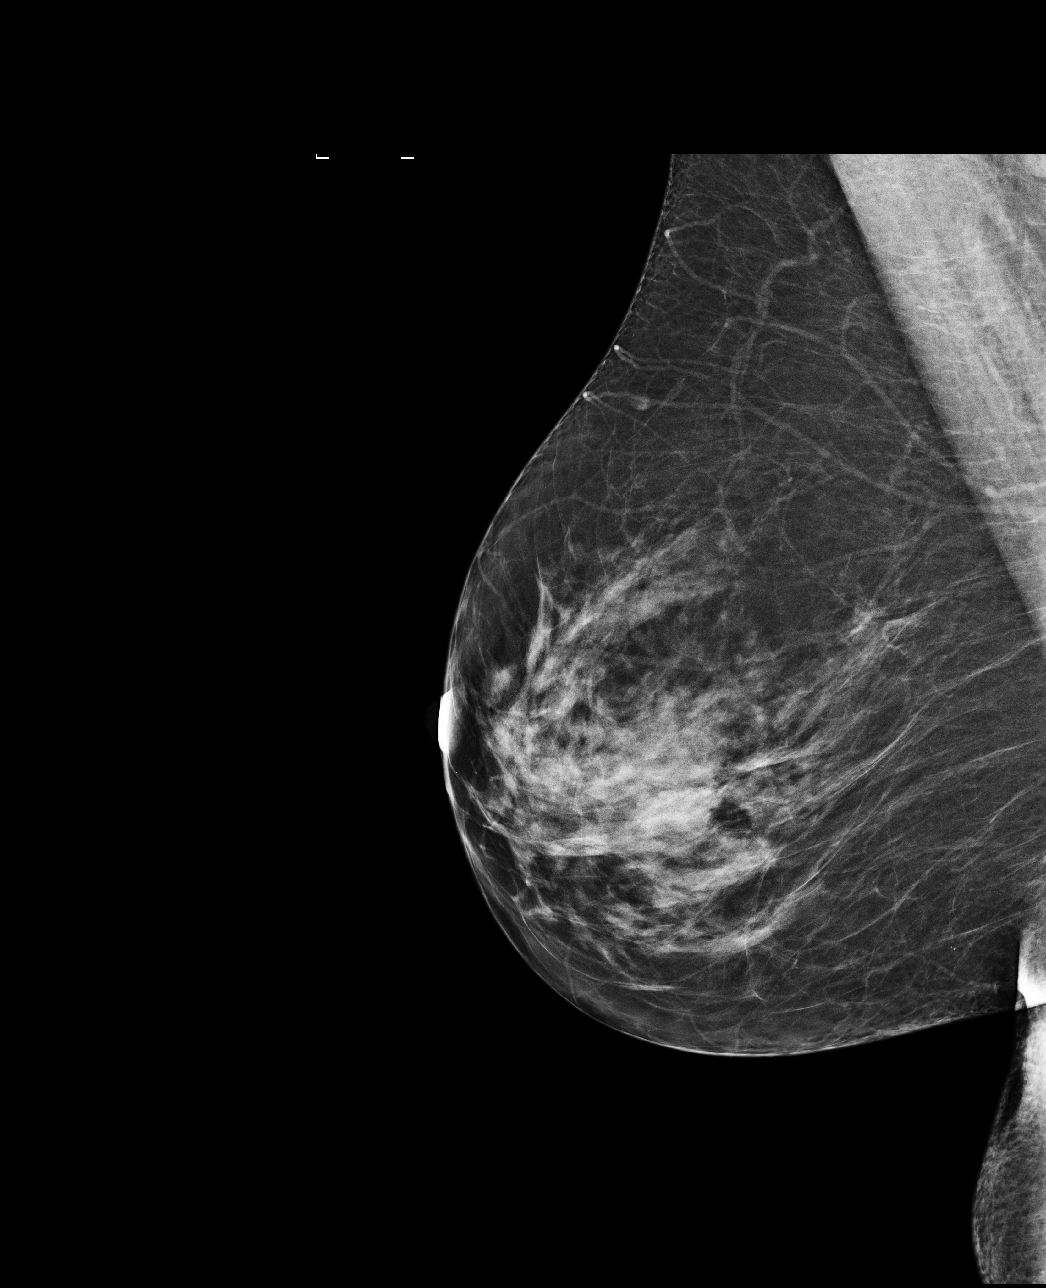

[R CC]
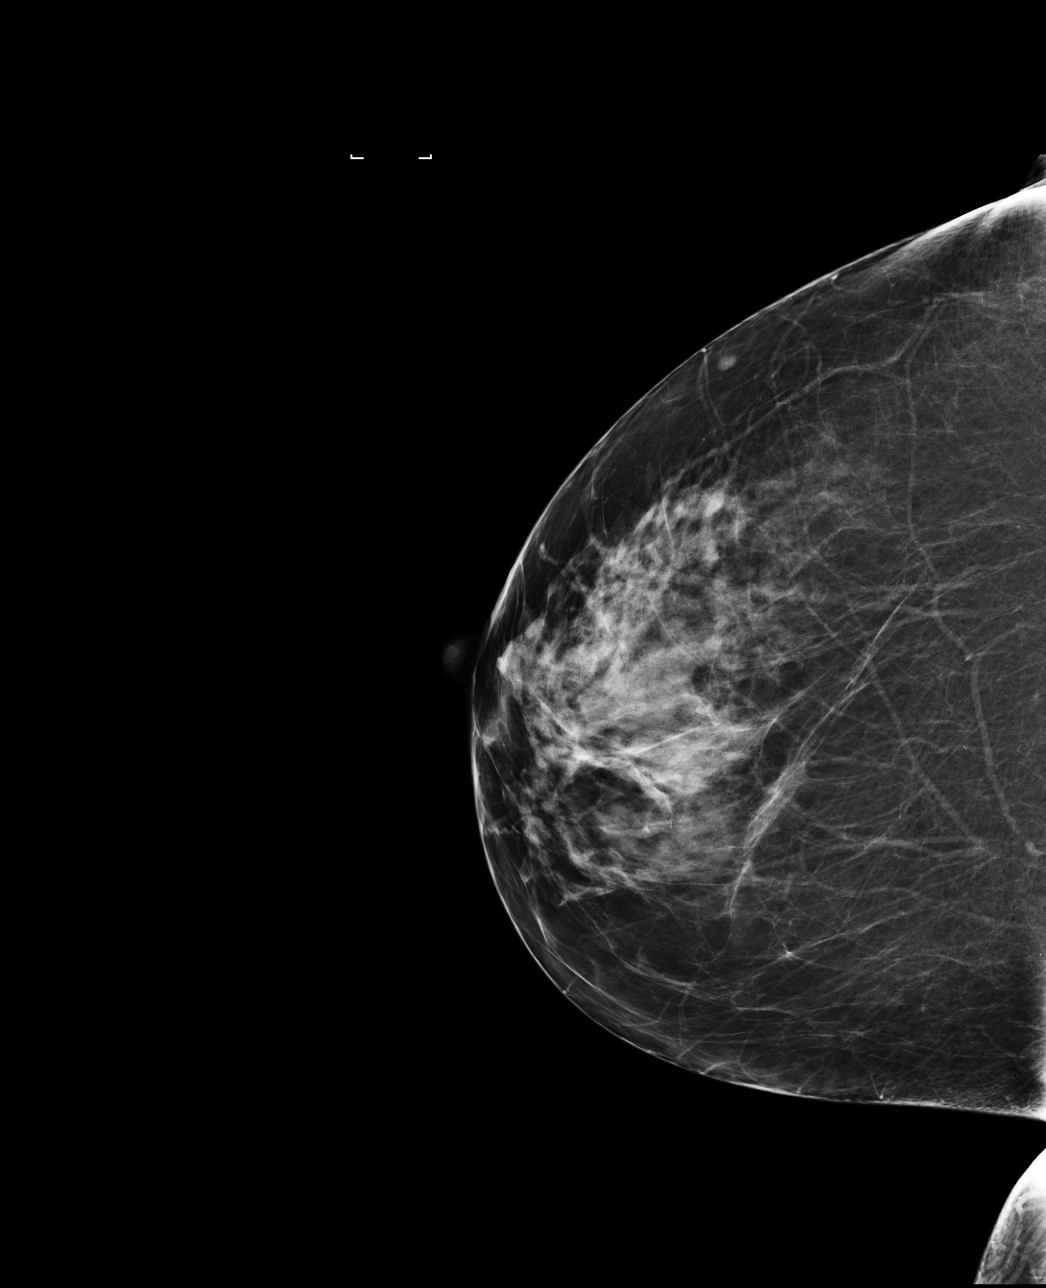

[L MLO]
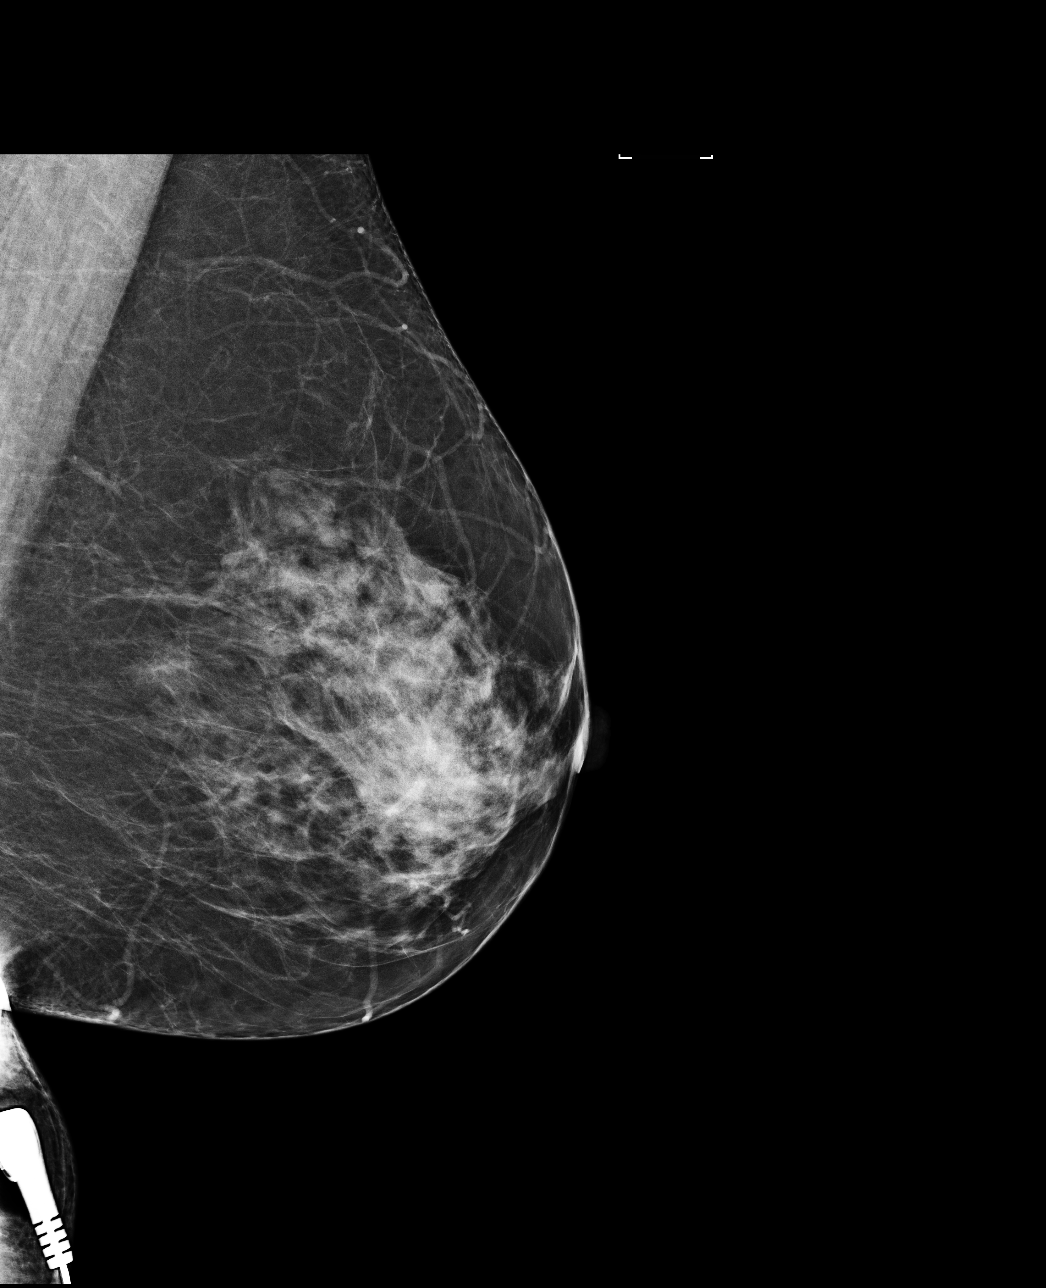

[4 of 4 positions shown; findings below may reference images not displayed]

ACR Breast Density Category c: The breast tissue is heterogeneously
dense, which may obscure small masses
FINDINGS: There are no findings suspicious for malignancy. Images were
processed with CAD.
IMPRESSION: No mammographic evidence of malignancy. A result letter of this
screening mammogram will be mailed directly to the patient.

RECOMMENDATION:
Screening mammogram in one year. (Code:[id number])

BI-RADS CATEGORY  1: Negative.

## 2017-07-23 MED ORDER — BUTORPHANOL TARTRATE 10 MG/ML NA SOLN: NASAL | 5 refills | Status: DC

## 2017-07-23 MED ORDER — TOPIRAMATE 100 MG PO TABS: ORAL_TABLET | ORAL | 3 refills | Status: DC

## 2017-07-23 MED ORDER — SUMATRIPTAN SUCCINATE 25 MG PO TABS: ORAL_TABLET | ORAL | 6 refills | Status: DC
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
## 2017-07-24 LAB — VITAMIN B12: Vitamin B-12: 669 pg/mL (ref 200–1100)

## 2017-07-28 ENCOUNTER — Encounter: Payer: Self-pay | Admitting: Obstetrics & Gynecology

## 2017-08-23 ENCOUNTER — Encounter: Payer: Self-pay | Admitting: Family Medicine

## 2017-08-23 ENCOUNTER — Ambulatory Visit (INDEPENDENT_AMBULATORY_CARE_PROVIDER_SITE_OTHER): Payer: Medicaid Other | Admitting: Family Medicine

## 2017-08-23 VITALS — BP 118/80 | HR 98 | Temp 98.3°F | Ht 63.0 in | Wt 185.0 lb

## 2017-08-23 DIAGNOSIS — Z09 Encounter for follow-up examination after completed treatment for conditions other than malignant neoplasm: Secondary | ICD-10-CM

## 2017-08-23 DIAGNOSIS — R519 Headache, unspecified: Secondary | ICD-10-CM

## 2017-08-23 DIAGNOSIS — M797 Fibromyalgia: Secondary | ICD-10-CM

## 2017-08-23 DIAGNOSIS — G629 Polyneuropathy, unspecified: Secondary | ICD-10-CM

## 2017-08-23 DIAGNOSIS — R51 Headache: Secondary | ICD-10-CM

## 2017-08-23 DIAGNOSIS — R7989 Other specified abnormal findings of blood chemistry: Secondary | ICD-10-CM | POA: Diagnosis not present

## 2017-08-23 DIAGNOSIS — R1011 Right upper quadrant pain: Secondary | ICD-10-CM

## 2017-08-23 DIAGNOSIS — D571 Sickle-cell disease without crisis: Secondary | ICD-10-CM

## 2017-08-23 LAB — POCT URINALYSIS DIP (MANUAL ENTRY)
Glucose, UA: NEGATIVE mg/dL
Nitrite, UA: NEGATIVE
Protein Ur, POC: >=300 mg/dL — AB
Spec Grav, UA: 1.02 (ref 1.010–1.025)
Urobilinogen, UA: 1 E.U./dL
pH, UA: 5 (ref 5.0–8.0)
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
## 2017-08-24 LAB — TSH: TSH: 3.77 u[IU]/mL (ref 0.450–4.500)

## 2017-08-26 ENCOUNTER — Encounter: Payer: Self-pay | Admitting: Obstetrics & Gynecology

## 2017-08-26 NOTE — Progress Notes (Deleted)
   Patient did not show up today for her scheduled appointment.   UGONNA  ANYANWU, MD, FACOG Obstetrician & Gynecologist, [facility],  [facility]  

## 2017-08-27 ENCOUNTER — Telehealth: Payer: Self-pay

## 2017-08-27 NOTE — Telephone Encounter (Signed)
Patient notified of results and of [name] appointment

## 2017-08-27 NOTE — Telephone Encounter (Signed)
-----   Message from [name], [place] sent at 08/26/2017  3:26 PM EDT ----- Regarding: "Lab Results" Kristin Braun,   Please inform patient that her Thyroid levels are in normal range, so we will not initiate medication at this time.   Also, I have placed order for her to get Abdominal Ultrasound for her upper right quadrant pain. Let her know that she should schedule to get scan as soon as possible.  Thank you!

## 2017-09-01 NOTE — Telephone Encounter (Signed)
-----   Message from [name], [place] sent at 08/31/2017  7:04 PM EDT ----- Regarding: "Lab Results" Morey Hummingbird,  Inform patient that labs results are stable. TSH levels is within therapeutic range.    Thanks.

## 2017-09-01 NOTE — Telephone Encounter (Signed)
Left a vm for patient to callback 

## 2017-09-02 ENCOUNTER — Ambulatory Visit (HOSPITAL_COMMUNITY)
Admission: RE | Admit: 2017-09-02 | Discharge: 2017-09-02 | Disposition: A | Discharge status: Home / Self Care | Payer: Medicaid Other | Source: Ambulatory Visit | Attending: Family Medicine | Admitting: Family Medicine

## 2017-09-02 DIAGNOSIS — R1011 Right upper quadrant pain: Secondary | ICD-10-CM | POA: Insufficient documentation

## 2017-09-02 DIAGNOSIS — K769 Liver disease, unspecified: Secondary | ICD-10-CM | POA: Diagnosis not present

## 2017-09-02 DIAGNOSIS — K824 Cholesterolosis of gallbladder: Secondary | ICD-10-CM | POA: Diagnosis not present

## 2017-09-02 NOTE — Telephone Encounter (Signed)
Patient notified

## 2017-09-02 NOTE — Telephone Encounter (Signed)
-----   Message from [name], [place] sent at 08/31/2017  7:04 PM EDT ----- Regarding: "Lab Results" Kristin Braun,  Inform patient that labs results are stable. TSH levels is within therapeutic range.    Thanks.

## 2017-09-08 ENCOUNTER — Telehealth: Payer: Self-pay

## 2017-09-10 ENCOUNTER — Telehealth: Payer: Self-pay | Admitting: Family Medicine

## 2017-09-10 NOTE — Telephone Encounter (Signed)
Pt called and requests information on results of ultrasound; states that she continues to experience pain related to last office visit, has episodes of nausea and diarrhea, and some itching; requests a call back from provider

## 2017-09-13 ENCOUNTER — Other Ambulatory Visit: Payer: Self-pay | Admitting: Family Medicine

## 2017-09-13 DIAGNOSIS — R110 Nausea: Secondary | ICD-10-CM

## 2017-09-13 MED ORDER — ONDANSETRON HCL 4 MG PO TABS: 4.0000 mg | ORAL_TABLET | Freq: Every day | ORAL | 1 refills | Status: DC | PRN

## 2017-09-13 NOTE — Telephone Encounter (Signed)
Rx for Zofran to pharmacy today. Please inform patient.   Thanks,.

## 2017-09-14 NOTE — Telephone Encounter (Signed)
Patient notified

## 2017-09-14 NOTE — Telephone Encounter (Signed)
Left a vm for patient to callback 

## 2017-09-20 ENCOUNTER — Telehealth: Payer: Self-pay | Admitting: Family Medicine

## 2017-09-20 NOTE — Telephone Encounter (Signed)
Message left for patient to return call to office

## 2017-09-20 NOTE — Telephone Encounter (Signed)
Left message to call office for scan results.

## 2017-09-29 ENCOUNTER — Encounter: Payer: Self-pay | Admitting: Family Medicine

## 2017-09-29 ENCOUNTER — Ambulatory Visit (INDEPENDENT_AMBULATORY_CARE_PROVIDER_SITE_OTHER): Payer: Self-pay | Admitting: Family Medicine

## 2017-09-29 VITALS — BP 120/76 | HR 98 | Temp 97.9°F | Ht 63.0 in | Wt 187.0 lb

## 2017-09-29 DIAGNOSIS — Z23 Encounter for immunization: Secondary | ICD-10-CM

## 2017-09-29 DIAGNOSIS — R1011 Right upper quadrant pain: Secondary | ICD-10-CM

## 2017-09-29 DIAGNOSIS — F319 Bipolar disorder, unspecified: Secondary | ICD-10-CM

## 2017-09-29 DIAGNOSIS — K5900 Constipation, unspecified: Secondary | ICD-10-CM

## 2017-09-29 DIAGNOSIS — Z09 Encounter for follow-up examination after completed treatment for conditions other than malignant neoplasm: Secondary | ICD-10-CM

## 2017-09-29 MED ORDER — POLYETHYLENE GLYCOL 3350 17 GM/SCOOP PO POWD: 17.0000 g | Freq: Two times a day (BID) | ORAL | 1 refills | Status: DC | PRN

## 2017-09-29 MED ORDER — HYDROXYZINE HCL 50 MG PO TABS: 50.0000 mg | ORAL_TABLET | Freq: Three times a day (TID) | ORAL | 2 refills | Status: DC
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
## 2017-09-29 NOTE — Patient Instructions (Signed)
Polyethylene Glycol powder What is this medicine? POLYETHYLENE GLYCOL 3350 (pol ee ETH i leen; GLYE col) powder is a laxative used to treat constipation. It increases the amount of water in the stool. Bowel movements become easier and more frequent. This medicine may be used for other purposes; ask your health care provider or pharmacist if you have questions. COMMON BRAND NAME(S): Sharlyn Bologna, GlycoLax, MiraLax, Smooth LAX, [facility] What should I tell my health care provider before I take this medicine? They need to know if you have any of these conditions: -a history of blockage of the stomach or intestine -current abdomen distension or pain -difficulty swallowing -diverticulitis, ulcerative colitis, or other chronic bowel disease -phenylketonuria -an unusual or allergic reaction to polyethylene glycol, other medicines, dyes, or preservatives -pregnant or trying to get pregnant -breast-feeding How should I use this medicine? Take this medicine by mouth. The bottle has a measuring cap that is marked with a line. Pour the powder into the cap up to the marked line (the dose is about 1 heaping tablespoon). Add the powder in the cap to a full glass (4 to 8 ounces or 120 to 240 ml) of water, juice, soda, coffee or tea. Mix the powder well. Drink the solution. Take exactly as directed. Do not take your medicine more often than directed. Talk to your pediatrician regarding the use of this medicine in children. Special care may be needed. Overdosage: If you think you have taken too much of this medicine contact a poison control center or emergency room at once. NOTE: This medicine is only for you. Do not share this medicine with others. What if I miss a dose? If you miss a dose, take it as soon as you can. If it is almost time for your next dose, take only that dose. Do not take double or extra doses. What may interact with this medicine? Interactions are not expected. This list may not  describe all possible interactions. Give your health care provider a list of all the medicines, herbs, non-prescription drugs, or dietary supplements you use. Also tell them if you smoke, drink alcohol, or use illegal drugs. Some items may interact with your medicine. What should I watch for while using this medicine? Do not use for more than 2 weeks without advice from your doctor or health care professional. It can take 2 to 4 days to have a bowel movement and to experience improvement in constipation. See your health care professional for any changes in bowel habits, including constipation, that are severe or last longer than three weeks. Always take this medicine with plenty of water. What side effects may I notice from receiving this medicine? Side effects that you should report to your doctor or health care professional as soon as possible: -diarrhea -difficulty breathing -itching of the skin, hives, or skin rash -severe bloating, pain, or distension of the stomach -vomiting Side effects that usually do not require medical attention (report to your doctor or health care professional if they continue or are bothersome): -bloating or gas -lower abdominal discomfort or cramps -nausea This list may not describe all possible side effects. Call your doctor for medical advice about side effects. You may report side effects to FDA at 1-800-FDA-1088. Where should I keep my medicine? Keep out of the reach of children. Store between 15 and 30 degrees C (59 and 86 degrees F). Throw away any unused medicine after the expiration date. NOTE: This sheet is a summary. It may not cover all  possible information. If you have questions about this medicine, talk to your doctor, pharmacist, or health care provider.  2018 Elsevier/Gold Standard (2007-09-05 16:50:45)

## 2017-10-11 ENCOUNTER — Other Ambulatory Visit: Payer: Self-pay | Admitting: Family Medicine

## 2017-10-11 ENCOUNTER — Telehealth: Payer: Self-pay

## 2017-10-11 DIAGNOSIS — N644 Mastodynia: Secondary | ICD-10-CM

## 2017-10-11 NOTE — Progress Notes (Signed)
Order in for Mammogram today.

## 2017-10-12 ENCOUNTER — Other Ambulatory Visit (HOSPITAL_COMMUNITY): Payer: Self-pay | Admitting: *Deleted

## 2017-10-12 DIAGNOSIS — N6310 Unspecified lump in the right breast, unspecified quadrant: Secondary | ICD-10-CM

## 2017-10-12 DIAGNOSIS — N6320 Unspecified lump in the left breast, unspecified quadrant: Secondary | ICD-10-CM

## 2017-10-28 ENCOUNTER — Ambulatory Visit
Admission: RE | Admit: 2017-10-28 | Discharge: 2017-10-28 | Disposition: A | Discharge status: Home / Self Care | Payer: No Typology Code available for payment source | Source: Ambulatory Visit | Attending: Obstetrics and Gynecology | Admitting: Obstetrics and Gynecology

## 2017-10-28 ENCOUNTER — Ambulatory Visit: Payer: Self-pay

## 2017-10-28 ENCOUNTER — Ambulatory Visit (HOSPITAL_COMMUNITY)
Admission: RE | Admit: 2017-10-28 | Discharge: 2017-10-28 | Disposition: A | Discharge status: Home / Self Care | Payer: Medicaid Other | Source: Ambulatory Visit | Attending: Obstetrics and Gynecology | Admitting: Obstetrics and Gynecology

## 2017-10-28 ENCOUNTER — Other Ambulatory Visit (HOSPITAL_COMMUNITY): Payer: Self-pay | Admitting: Obstetrics and Gynecology

## 2017-10-28 ENCOUNTER — Encounter (HOSPITAL_COMMUNITY): Payer: Self-pay

## 2017-10-28 VITALS — BP 110/64 | Ht 64.0 in

## 2017-10-28 DIAGNOSIS — Z1239 Encounter for other screening for malignant neoplasm of breast: Secondary | ICD-10-CM

## 2017-10-28 DIAGNOSIS — N6310 Unspecified lump in the right breast, unspecified quadrant: Secondary | ICD-10-CM

## 2017-10-28 DIAGNOSIS — N6452 Nipple discharge: Secondary | ICD-10-CM

## 2017-10-28 DIAGNOSIS — N6320 Unspecified lump in the left breast, unspecified quadrant: Secondary | ICD-10-CM

## 2017-10-28 DIAGNOSIS — N6311 Unspecified lump in the right breast, upper outer quadrant: Secondary | ICD-10-CM

## 2017-10-28 NOTE — Progress Notes (Signed)
Complaints of a right breast lump since June 2019. Patient stated she expressed a brownish colored clear discharge from her right breast in June 2019. Complaints of left nipple changes.  Pap Smear: Pap smear not completed today. Last Pap smear was 03/31/2017 at [facility] and normal. Per patient has no history of an abnormal Pap smear. Last Pap smear result is in Epic.  Physical exam: Breasts Breast Symmetrical. No skin abnormalities bilateral breasts. No nipple retraction bilateral breasts. No abnormal nipple changes observed on exam within left breast. No nipple discharge left breast. Expressed a clear colored nipple discharge from the right breast on exam. Sample of discharge sent to Cytology for evaluation. No lymphadenopathy. No lumps palpated left breast. Palpated a bb sized lump within the right breast at 11 o'clock 8 cm from the nipple. Complaints of left outer breast tenderness on exam. Referred patient to the [facility] for diagnostic mammogram and right breast ultrasound. Appointment scheduled for Thursday, October 28, 2017 at 0920.      Pelvic/Bimanual No Pap smear completed today since last Pap smear was 03/31/2017. Pap smear not indicated per BCCCP guidelines.   Smoking History: Patient has never smoked.  Patient Navigation: Patient education provided. Access to services provided for patient through BCCCP program.   Breast and Cervical Cancer Risk Assessment: Patient has no family history of breast cancer, known genetic mutations, or radiation treatment to the chest before age 86. Patient has no history of cervical dysplasia, immunocompromised, or DES exposure in-utero.  Risk Assessment    Risk Scores      10/28/2017   Last edited by: Armond Hang, LPN   5-year risk: 0.9 %   Lifetime risk: 10.8 %

## 2017-10-28 NOTE — Patient Instructions (Signed)
Explained breast self awareness with Carolynn Sayers. Patient did not need a Pap smear today due to last Pap smear was 03/31/2017. Let her know BCCCP will cover Pap smears every 3 years unless has a history of abnormal Pap smears. Referred patient to the [facility] for diagnostic mammogram and right breast ultrasound. Appointment scheduled for Thursday, October 28, 2017 at 0920. Carolynn Sayers verbalized understanding.  Santos Sollenberger, Arvil Chaco, RN 10:33 AM

## 2017-10-29 ENCOUNTER — Encounter (HOSPITAL_COMMUNITY): Payer: Self-pay | Admitting: *Deleted

## 2017-11-01 ENCOUNTER — Ambulatory Visit: Payer: Self-pay | Admitting: Family Medicine

## 2017-11-04 ENCOUNTER — Ambulatory Visit: Payer: Self-pay | Admitting: Neurology

## 2017-11-22 ENCOUNTER — Telehealth (HOSPITAL_COMMUNITY): Payer: Self-pay | Admitting: *Deleted

## 2017-11-22 NOTE — Telephone Encounter (Signed)
Patient called me back and left voicemail. Called patient back and let her know that her breast discharge was negative. Let patient know that her mammogram was benign and that a screening mammogram is recommended in one year. Patient verbalized understanding.

## 2017-11-22 NOTE — Telephone Encounter (Signed)
Attempted to call patient to discuss breast discharge results. No one answered phone. Left voicemail for patient to call me back.

## 2017-11-22 NOTE — Telephone Encounter (Signed)
No not needed

## 2017-11-23 ENCOUNTER — Ambulatory Visit (INDEPENDENT_AMBULATORY_CARE_PROVIDER_SITE_OTHER): Payer: Medicaid Other | Admitting: Family Medicine

## 2017-11-23 ENCOUNTER — Encounter: Payer: Self-pay | Admitting: Family Medicine

## 2017-11-23 VITALS — BP 118/80 | HR 92 | Temp 98.0°F | Ht 64.0 in | Wt 179.0 lb

## 2017-11-23 DIAGNOSIS — F419 Anxiety disorder, unspecified: Secondary | ICD-10-CM

## 2017-11-23 DIAGNOSIS — G43709 Chronic migraine without aura, not intractable, without status migrainosus: Secondary | ICD-10-CM | POA: Diagnosis not present

## 2017-11-23 DIAGNOSIS — IMO0002 Reserved for concepts with insufficient information to code with codable children: Secondary | ICD-10-CM

## 2017-11-23 DIAGNOSIS — R1084 Generalized abdominal pain: Secondary | ICD-10-CM | POA: Diagnosis not present

## 2017-11-23 DIAGNOSIS — Z09 Encounter for follow-up examination after completed treatment for conditions other than malignant neoplasm: Secondary | ICD-10-CM

## 2017-11-23 DIAGNOSIS — G4700 Insomnia, unspecified: Secondary | ICD-10-CM

## 2017-11-23 DIAGNOSIS — R569 Unspecified convulsions: Secondary | ICD-10-CM | POA: Diagnosis not present

## 2017-11-23 LAB — POCT URINALYSIS DIP (MANUAL ENTRY)
Bilirubin, UA: NEGATIVE
Blood, UA: NEGATIVE
Glucose, UA: NEGATIVE mg/dL
Ketones, POC UA: NEGATIVE mg/dL
Leukocytes, UA: NEGATIVE
Nitrite, UA: NEGATIVE
Protein Ur, POC: NEGATIVE mg/dL
Spec Grav, UA: >=1.03 — AB (ref 1.010–1.025)
Urobilinogen, UA: 0.2 E.U./dL
pH, UA: 5.5 (ref 5.0–8.0)

## 2017-11-23 MED ORDER — TRAZODONE HCL 50 MG PO TABS: 25.0000 mg | ORAL_TABLET | Freq: Every evening | ORAL | 3 refills | Status: DC | PRN

## 2017-11-23 NOTE — Progress Notes (Signed)
Follow Up  Subjective:    Patient ID: Kristin Braun, female    DOB: Dec 06, 1975, 42 y.o.   MRN: [id number]   Chief Complaint  Patient presents with  . Follow-up    Chronic condition    HPI  Kristin Braun is a 42 year female with a past medical history of Thyroid Disease, TBI, Sleep Apnea, Seizures, PTSD, Osteoporosis, Osteoarthritis, Hypertension.   Current Status: Since her last office visit, she is doing well. She continues to follow up at [facility] with 'Mr. [name]', who increased her Seroquel dosage to 800 mg because patient state that she has been hearing voices. Her initial appointment with [facility] on 11/30/2017 for Fibromyalgia. She states that she has been noticing dark urine and dysuria. No reports of GI problems such as nausea, vomiting, diarrhea, and constipation. She has no reports of blood in stools, and hematuria. She reports that her periods are heavy.   She denies fevers, chills, fatigue, recent infections, weight loss, and night sweats. She has not had any headaches, visual changes, dizziness, and falls. No chest pain, heart palpitations, cough and shortness of breath reported. No depression or anxiety reported. She denies pain today.   Review of Systems  Constitutional: Negative.   HENT: Negative.   Gastrointestinal: Negative.   Genitourinary: Negative.   Musculoskeletal: Negative.   Skin: Negative.   Neurological: Negative.   Psychiatric/Behavioral: Negative.    Objective:   Physical Exam  Constitutional: She is oriented to person, place, and time.  Cardiovascular: Normal rate, regular rhythm, normal heart sounds and intact distal pulses.  Pulmonary/Chest: Effort normal and breath sounds normal.  Abdominal: Soft. Bowel sounds are normal.  Neurological: She is alert and oriented to person, place, and time.  Skin: Skin is warm and dry.  Psychiatric: She has a normal mood and affect. Her behavior is normal. Judgment and thought content normal.   Assessment &  Plan:   1. Seizures ([place]) - Ambulatory referral to Neurology  2. Generalized abdominal pain Stable. Not worsening.   3. Chronic migraine Continue Imitrex and Topamax as prescribed.  - Ambulatory referral to Neurology  4. Insomnia, unspecified type We will initiate Trazodone today.  - traZODone (DESYREL) 50 MG tablet; Take 0.5-1 tablets (25-50 mg total) by mouth at bedtime as needed for sleep.  Dispense: 30 tablet; Refill: 3  5. Anxiety Stable today. Continue Seroquel as prescribed. Continue to follow up with mental health counselor as needed.   6. Follow up She will follow up in 2 months.  Meds ordered this encounter  Medications  . traZODone (DESYREL) 50 MG tablet    Sig: Take 0.5-1 tablets (25-50 mg total) by mouth at bedtime as needed for sleep.    Dispense:  30 tablet    Refill:  [place],  MSN, FNP-C [facility] [address] [place], [place] [address] [phone]

## 2017-11-23 NOTE — Patient Instructions (Signed)
Trazodone extended release oral tablets What is this medicine? TRAZODONE (TRAZ oh done) is used to treat depression. This medicine may be used for other purposes; ask your health care provider or pharmacist if you have questions. COMMON BRAND NAME(S): Oleptro What should I tell my health care provider before I take this medicine? They need to know if you have any of these conditions: -attempted suicide or thinking about it -bipolar disorder -bleeding problems -glaucoma -heart disease, or previous heart attack -irregular heart beat -kidney disease -liver disease -low levels of sodium in the blood -an unusual or allergic reaction to trazodone, other medicines, foods, dyes or preservatives -pregnant or trying to get pregnant -breast-feeding How should I use this medicine? Take this medicine by mouth with a glass of water. Follow the directions on the prescription label. Take this medicine on an empty stomach, at least 30 minutes before or 2 hours after food. Do not take with food. Do not crush or chew this medicine. You may break in half along the score line. Take your medicine at bedtime everyday. Do not take your medicine more often than directed. Do not stop taking this medicine suddenly except upon the advice of your doctor. Stopping this medicine too quickly may cause serious side effects or your condition may worsen. A special MedGuide will be given to you by the pharmacist with each prescription and refill. Be sure to read this information carefully each time. Talk to your pediatrician regarding the use of this medicine in children. Special care may be needed. Overdosage: If you think you have taken too much of this medicine contact a poison control center or emergency room at once. NOTE: This medicine is only for you. Do not share this medicine with others. What if I miss a dose? If you miss a dose, take it as soon as you can. If it is almost time for your next dose, take only that  dose. Do not take double or extra doses. What may interact with this medicine? Do not take this medicine with any of the following medications: -certain medicines for fungal infections like fluconazole, itraconazole, ketoconazole, posaconazole, voriconazole -cisapride -dofetilide -dronedarone -linezolid -MAOIs like Carbex, Eldepryl, Marplan, Nardil, and Parnate -mesoridazine -methylene blue (injected into a vein) -pimozide -saquinavir -thioridazine -ziprasidone This medicine may also interact with the following medications: -alcohol -antiviral medicines for HIV or AIDS -aspirin and aspirin-like medicines -barbiturates like phenobarbital -certain medicines for blood pressure, heart disease, irregular heart beat -certain medicines for depression, anxiety, or psychotic disturbances -certain medicines for migraine headache like almotriptan, eletriptan, frovatriptan, naratriptan, rizatriptan, sumatriptan, zolmitriptan -certain medicines for seizures like carbamazepine, phenytoin -certain medicines for sleep -certain medicines that treat or prevent blood clots like dalteparin, enoxaparin, warfarin -digoxin -fentanyl -lithium -NSAIDS, medicines for pain and inflammation, like ibuprofen or naproxen -other medicines that prolong the QT interval (cause an abnormal heart rhythm) -rasagiline -supplements like St. John's wort, kava kava, valerian -tramadol -tryptophan This list may not describe all possible interactions. Give your health care provider a list of all the medicines, herbs, non-prescription drugs, or dietary supplements you use. Also tell them if you smoke, drink alcohol, or use illegal drugs. Some items may interact with your medicine. What should I watch for while using this medicine? Tell your doctor if your symptoms do not get better or if they get worse. Visit your doctor or health care professional for regular checks on your progress. Because it may take several weeks to  see the full effects of this medicine,   it is important to continue your treatment as prescribed by your doctor. Patients and their families should watch out for new or worsening thoughts of suicide or depression. Also watch out for sudden changes in feelings such as feeling anxious, agitated, panicky, irritable, hostile, aggressive, impulsive, severely restless, overly excited and hyperactive, or not being able to sleep. If this happens, especially at the beginning of treatment or after a change in dose, call your health care professional. You may get drowsy or dizzy. Do not drive, use machinery, or do anything that needs mental alertness until you know how this medicine affects you. Do not stand or sit up quickly, especially if you are an older patient. This reduces the risk of dizzy or fainting spells. Alcohol may interfere with the effect of this medicine. Avoid alcoholic drinks. This medicine may cause dry eyes and blurred vision. If you wear contact lenses you may feel some discomfort. Lubricating drops may help. See your eye doctor if the problem does not go away or is severe. Your mouth may get dry. Chewing sugarless gum, sucking hard candy and drinking plenty of water may help. Contact your doctor if the problem does not go away or is severe. What side effects may I notice from receiving this medicine? Side effects that you should report to your doctor or health care professional as soon as possible: -allergic reactions like skin rash, itching or hives, swelling of the face, lips, or tongue -elevated mood, decreased need for sleep, racing thoughts, impulsive behavior -confusion -fast, irregular heartbeat -feeling faint or lightheaded, falls -feeling agitated, angry, or irritable -loss of balance or coordination -painful or prolonged erections -restlessness, pacing, inability to keep still -suicidal thoughts or other mood changes -tremors -trouble sleeping -seizures -unusual bleeding or  bruising Side effects that usually do not require medical attention (report to your doctor or health care professional if they continue or are bothersome): -change in sex drive or performance -change in appetite or weight -constipation -headache -muscle aches or pains -nausea This list may not describe all possible side effects. Call your doctor for medical advice about side effects. You may report side effects to FDA at 1-800-FDA-1088. Where should I keep my medicine? Keep out of the reach of children. Store at room temperature between 15 and 30 degrees C (59 to 86 degrees F). Protect from light. Keep container tightly closed. Throw away any unused medicine after the expiration date. NOTE: This sheet is a summary. It may not cover all possible information. If you have questions about this medicine, talk to your doctor, pharmacist, or health care provider.  2018 [employer] (2015-07-04 16:55:11)  

## 2017-12-19 ENCOUNTER — Other Ambulatory Visit: Payer: Self-pay | Admitting: Family Medicine

## 2017-12-19 DIAGNOSIS — R110 Nausea: Secondary | ICD-10-CM

## 2017-12-29 ENCOUNTER — Other Ambulatory Visit: Payer: Self-pay | Admitting: Family Medicine

## 2017-12-29 DIAGNOSIS — R110 Nausea: Secondary | ICD-10-CM

## 2018-01-20 ENCOUNTER — Institutional Professional Consult (permissible substitution): Payer: Self-pay | Admitting: Neurology

## 2018-01-24 ENCOUNTER — Ambulatory Visit: Payer: Self-pay | Admitting: Family Medicine

## 2018-02-21 ENCOUNTER — Ambulatory Visit: Payer: Self-pay | Admitting: Family Medicine

## 2018-02-22 ENCOUNTER — Ambulatory Visit (INDEPENDENT_AMBULATORY_CARE_PROVIDER_SITE_OTHER): Payer: Medicare Other | Admitting: Neurology

## 2018-02-22 ENCOUNTER — Encounter: Payer: Self-pay | Admitting: Neurology

## 2018-02-22 VITALS — BP 141/96 | HR 86 | Ht 64.0 in | Wt 186.0 lb

## 2018-02-22 DIAGNOSIS — G43109 Migraine with aura, not intractable, without status migrainosus: Secondary | ICD-10-CM

## 2018-02-22 DIAGNOSIS — G40A19 Absence epileptic syndrome, intractable, without status epilepticus: Secondary | ICD-10-CM

## 2018-02-22 DIAGNOSIS — Z87820 Personal history of traumatic brain injury: Secondary | ICD-10-CM

## 2018-02-22 DIAGNOSIS — G40209 Localization-related (focal) (partial) symptomatic epilepsy and epileptic syndromes with complex partial seizures, not intractable, without status epilepticus: Secondary | ICD-10-CM | POA: Diagnosis not present

## 2018-02-22 DIAGNOSIS — S062X4S Diffuse traumatic brain injury with loss of consciousness of 6 hours to 24 hours, sequela: Secondary | ICD-10-CM

## 2018-02-22 DIAGNOSIS — F99 Mental disorder, not otherwise specified: Secondary | ICD-10-CM

## 2018-02-22 DIAGNOSIS — G43709 Chronic migraine without aura, not intractable, without status migrainosus: Secondary | ICD-10-CM

## 2018-02-22 DIAGNOSIS — IMO0002 Reserved for concepts with insufficient information to code with codable children: Secondary | ICD-10-CM

## 2018-02-22 DIAGNOSIS — F5105 Insomnia due to other mental disorder: Secondary | ICD-10-CM | POA: Diagnosis not present

## 2018-02-22 MED ORDER — TOPIRAMATE 200 MG PO TABS: 200.0000 mg | ORAL_TABLET | Freq: Two times a day (BID) | ORAL | 3 refills | Status: DC
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
## 2018-02-23 LAB — COMPREHENSIVE METABOLIC PANEL
ALT: 48 IU/L — ABNORMAL HIGH (ref 0–32)
AST: 27 IU/L (ref 0–40)
Albumin/Globulin Ratio: 1.9 (ref 1.2–2.2)
Albumin: 4.7 g/dL (ref 3.5–5.5)
Alkaline Phosphatase: 98 IU/L (ref 39–117)
BUN/Creatinine Ratio: 11 (ref 9–23)
BUN: 9 mg/dL (ref 6–24)
Bilirubin Total: 0.4 mg/dL (ref 0.0–1.2)
CO2: 23 mmol/L (ref 20–29)
CREATININE: 0.82 mg/dL (ref 0.57–1.00)
Calcium: 9.8 mg/dL (ref 8.7–10.2)
Chloride: 103 mmol/L (ref 96–106)
GFR calc Af Amer: 101 mL/min/{1.73_m2} (ref >59)
GFR calc non Af Amer: 88 mL/min/{1.73_m2} (ref >59)
Globulin, Total: 2.5 g/dL (ref 1.5–4.5)
Glucose: 89 mg/dL (ref 65–99)
Potassium: 4.5 mmol/L (ref 3.5–5.2)
Sodium: 141 mmol/L (ref 134–144)
Total Protein: 7.2 g/dL (ref 6.0–8.5)

## 2018-02-23 LAB — CBC WITH DIFFERENTIAL/PLATELET
Basophils Absolute: 0.1 10*3/uL (ref 0.0–0.2)
Basos: 1 %
EOS (ABSOLUTE): 0.1 10*3/uL (ref 0.0–0.4)
Eos: 2 %
HEMOGLOBIN: 15.9 g/dL (ref 11.1–15.9)
Hematocrit: 46.4 % (ref 34.0–46.6)
IMMATURE GRANS (ABS): 0 10*3/uL (ref 0.0–0.1)
Immature Granulocytes: 0 %
LYMPHS: 42 %
Lymphocytes Absolute: 3.9 10*3/uL — ABNORMAL HIGH (ref 0.7–3.1)
MCH: 29.9 pg (ref 26.6–33.0)
MCHC: 34.3 g/dL (ref 31.5–35.7)
MCV: 87 fL (ref 79–97)
Monocytes Absolute: 0.4 10*3/uL (ref 0.1–0.9)
Monocytes: 4 %
NEUTROS PCT: 51 %
Neutrophils Absolute: 4.8 10*3/uL (ref 1.4–7.0)
Platelets: 306 10*3/uL (ref 150–450)
RBC: 5.32 x10E6/uL — ABNORMAL HIGH (ref 3.77–5.28)
RDW: 13 % (ref 11.7–15.4)
WBC: 9.3 10*3/uL (ref 3.4–10.8)

## 2018-02-25 ENCOUNTER — Encounter: Payer: Self-pay | Admitting: Neurology

## 2018-02-25 ENCOUNTER — Telehealth: Payer: Self-pay | Admitting: Neurology

## 2018-02-25 NOTE — Telephone Encounter (Signed)
error 

## 2018-02-25 NOTE — Telephone Encounter (Signed)
-----   Message from Larey Seat, MD sent at 02/24/2018  4:47 PM EST ----- Slight elevation of liver enzymes, not concerning ( ALT is not 100% elevated) , RBC was elevated , WBC in normal range . This constellation allows continuation of current  medication.

## 2018-02-25 NOTE — Telephone Encounter (Signed)
Attempted to call the number on file and it states not in service. Called the number that was listed on the most recent DPR and there was no answer. LVM informing that lab work was fine, Dr Brett Fairy had no concerns and advised the patient if she had questions to call.  If patient calls back please make her aware we were just calling with lab results and that they were stable, which means she should continue current medications. There was nothing of any concern.

## 2018-03-01 ENCOUNTER — Other Ambulatory Visit: Payer: Medicare Other

## 2018-03-02 ENCOUNTER — Encounter: Payer: Self-pay | Admitting: Neurology

## 2018-03-22 ENCOUNTER — Ambulatory Visit: Payer: Self-pay | Admitting: Family Medicine

## 2018-03-30 ENCOUNTER — Ambulatory Visit (INDEPENDENT_AMBULATORY_CARE_PROVIDER_SITE_OTHER): Payer: Medicare Other | Admitting: Family Medicine

## 2018-03-30 ENCOUNTER — Telehealth: Payer: Self-pay | Admitting: Neurology

## 2018-03-30 ENCOUNTER — Encounter: Payer: Self-pay | Admitting: Family Medicine

## 2018-03-30 VITALS — BP 122/84 | HR 78 | Temp 97.8°F | Ht 64.0 in | Wt 187.0 lb

## 2018-03-30 DIAGNOSIS — F419 Anxiety disorder, unspecified: Secondary | ICD-10-CM

## 2018-03-30 DIAGNOSIS — M79605 Pain in left leg: Secondary | ICD-10-CM

## 2018-03-30 DIAGNOSIS — N644 Mastodynia: Secondary | ICD-10-CM | POA: Diagnosis not present

## 2018-03-30 DIAGNOSIS — M79604 Pain in right leg: Secondary | ICD-10-CM

## 2018-03-30 DIAGNOSIS — Z09 Encounter for follow-up examination after completed treatment for conditions other than malignant neoplasm: Secondary | ICD-10-CM

## 2018-03-30 LAB — POCT URINALYSIS DIP (MANUAL ENTRY)
Bilirubin, UA: NEGATIVE
Blood, UA: NEGATIVE
Glucose, UA: NEGATIVE mg/dL
Ketones, POC UA: NEGATIVE mg/dL
Nitrite, UA: NEGATIVE
Protein Ur, POC: NEGATIVE mg/dL
Spec Grav, UA: >=1.03 — AB (ref 1.010–1.025)
Urobilinogen, UA: 0.2 E.U./dL
pH, UA: 5.5 (ref 5.0–8.0)

## 2018-03-30 MED ORDER — GABAPENTIN 100 MG PO CAPS: 100.0000 mg | ORAL_CAPSULE | Freq: Three times a day (TID) | ORAL | 3 refills | Status: DC
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
## 2018-03-30 NOTE — Telephone Encounter (Signed)
Called the patient and informed her that Dr Brett Fairy brought to Dr Jaynee Eagles who is our migraine specialist. Dr Jaynee Eagles reviewed her chart and agreed to see the patient to assist with migraine management. Patient states that she is having >14 a month. Was able to get the patient scheduled with Dr Jaynee Eagles for Friday at 11:30 am with check in of 11 am. Advised the patient to make a list of medications she has tried and failed for her headaches and migraines. Also encouraged the patient to keep a diary and write down the recent migraines she has been dealing with and how often they are happening to help offer information to Dr Jaynee Eagles so that we can come in with a treatment plan for her. Pt verbalized understanding and was very appreciative

## 2018-03-30 NOTE — Telephone Encounter (Signed)
Pt called asking if there is any other medication she can take for her headaches. Pt states she is out of the Imitrex and will not be refilling that medication since it really does not work well for her and she can not take the stadol since it causes her to itch real bad on face and arms and it knocks her out. Please advise.

## 2018-03-30 NOTE — Telephone Encounter (Signed)
Can we ask Dr Jaynee Eagles for a second opinion. May be triggerpoints, etc/

## 2018-03-30 NOTE — Telephone Encounter (Signed)
We have the new tosymra, also we can send toradol injections outpatient, or the new Ubrelvy. Stop by we have copay cards

## 2018-03-30 NOTE — Telephone Encounter (Signed)
For prevention or for acute management?

## 2018-04-01 ENCOUNTER — Ambulatory Visit (INDEPENDENT_AMBULATORY_CARE_PROVIDER_SITE_OTHER): Payer: Medicare Other | Admitting: Neurology

## 2018-04-01 ENCOUNTER — Encounter: Payer: Self-pay | Admitting: Neurology

## 2018-04-01 VITALS — BP 131/87 | HR 82 | Ht 64.0 in | Wt 188.0 lb

## 2018-04-01 DIAGNOSIS — G43711 Chronic migraine without aura, intractable, with status migrainosus: Secondary | ICD-10-CM | POA: Diagnosis not present

## 2018-04-01 MED ORDER — METOCLOPRAMIDE HCL 10 MG PO TABS: 10.0000 mg | ORAL_TABLET | Freq: Three times a day (TID) | ORAL | 1 refills | Status: DC | PRN

## 2018-04-01 MED ORDER — GALCANEZUMAB-GNLM 120 MG/ML ~~LOC~~ SOAJ: 120.0000 mg | SUBCUTANEOUS | 11 refills | Status: DC
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
## 2018-04-01 NOTE — Patient Instructions (Signed)
Start Emgality Decrease dose of ibuprofen to only 2-3x a week Take Regan (Metoclopramide) See you back for botox  Galcanezumab injection What is this medicine? GALCANEZUMAB (gal ka NEZ ue mab) is used to prevent migraines and treat cluster headaches. This medicine may be used for other purposes; ask your health care provider or pharmacist if you have questions. COMMON BRAND NAME(S): Emgality What should I tell my health care provider before I take this medicine? They need to know if you have any of these conditions: -an unusual or allergic reaction to galcanezumab, other medicines, foods, dyes, or preservatives -pregnant or trying to get pregnant -breast-feeding How should I use this medicine? This medicine is for injection under the skin. You will be taught how to prepare and give this medicine. Use exactly as directed. Take your medicine at regular intervals. Do not take your medicine more often than directed. It is important that you put your used needles and syringes in a special sharps container. Do not put them in a trash can. If you do not have a sharps container, call your pharmacist or healthcare provider to get one. Talk to your pediatrician regarding the use of this medicine in children. Special care may be needed. Overdosage: If you think you have taken too much of this medicine contact a poison control center or emergency room at once. NOTE: This medicine is only for you. Do not share this medicine with others. What if I miss a dose? If you miss a dose, take it as soon as you can. If it is almost time for your next dose, take only that dose. Do not take double or extra doses. What may interact with this medicine? Interactions are not expected. This list may not describe all possible interactions. Give your health care provider a list of all the medicines, herbs, non-prescription drugs, or dietary supplements you use. Also tell them if you smoke, drink alcohol, or use illegal  drugs. Some items may interact with your medicine. What should I watch for while using this medicine? Tell your doctor or healthcare professional if your symptoms do not start to get better or if they get worse. What side effects may I notice from receiving this medicine? Side effects that you should report to your doctor or health care professional as soon as possible: -allergic reactions like skin rash, itching or hives, swelling of the face, lips, or tongue Side effects that usually do not require medical attention (report these to your doctor or health care professional if they continue or are bothersome): -pain, redness, or irritation at site where injected This list may not describe all possible side effects. Call your doctor for medical advice about side effects. You may report side effects to FDA at 1-800-FDA-1088. Where should I keep my medicine? Keep out of the reach of children. You will be instructed on how to store this medicine. Throw away any unused medicine after the expiration date on the label. NOTE: This sheet is a summary. It may not cover all possible information. If you have questions about this medicine, talk to your doctor, pharmacist, or health care provider.  2019 Elsevier/Gold Standard (2017-07-21 12:03:23)  OnabotulinumtoxinA injection (Medical Use) What is this medicine? ONABOTULINUMTOXINA (o na BOTT you lye num tox in eh) is a neuro-muscular blocker. This medicine is used to treat crossed eyes, eyelid spasms, severe neck muscle spasms, ankle and toe muscle spasms, and elbow, wrist, and finger muscle spasms. It is also used to treat excessive underarm sweating, to  prevent chronic migraine headaches, and to treat loss of bladder control due to neurologic conditions such as multiple sclerosis or spinal cord injury. This medicine may be used for other purposes; ask your health care provider or pharmacist if you have questions. COMMON BRAND NAME(S): Botox What should I  tell my health care provider before I take this medicine? They need to know if you have any of these conditions: -breathing problems -cerebral palsy spasms -difficulty urinating -heart problems -history of surgery where this medicine is going to be used -infection at the site where this medicine is going to be used -myasthenia gravis or other neurologic disease -nerve or muscle disease -surgery plans -take medicines that treat or prevent blood clots -thyroid problems -an unusual or allergic reaction to botulinum toxin, albumin, other medicines, foods, dyes, or preservatives -pregnant or trying to get pregnant -breast-feeding How should I use this medicine? This medicine is for injection into a muscle. It is given by a health care professional in a hospital or clinic setting. Talk to your pediatrician regarding the use of this medicine in children. While this drug may be prescribed for children as young as 33 years old for selected conditions, precautions do apply. Overdosage: If you think you have taken too much of this medicine contact a poison control center or emergency room at once. NOTE: This medicine is only for you. Do not share this medicine with others. What if I miss a dose? This does not apply. What may interact with this medicine? -aminoglycoside antibiotics like gentamicin, neomycin, tobramycin -muscle relaxants -other botulinum toxin injections This list may not describe all possible interactions. Give your health care provider a list of all the medicines, herbs, non-prescription drugs, or dietary supplements you use. Also tell them if you smoke, drink alcohol, or use illegal drugs. Some items may interact with your medicine. What should I watch for while using this medicine? Visit your doctor for regular check ups. This medicine will cause weakness in the muscle where it is injected. Tell your doctor if you feel unusually weak in other muscles. Get medical help right away  if you have problems with breathing, swallowing, or talking. This medicine might make your eyelids droop or make you see blurry or double. If you have weak muscles or trouble seeing do not drive a car, use machinery, or do other dangerous activities. This medicine contains albumin from human blood. It may be possible to pass an infection in this medicine, but no cases have been reported. Talk to your doctor about the risks and benefits of this medicine. If your activities have been limited by your condition, go back to your regular routine slowly after treatment with this medicine. What side effects may I notice from receiving this medicine? Side effects that you should report to your doctor or health care professional as soon as possible: -allergic reactions like skin rash, itching or hives, swelling of the face, lips, or tongue -breathing problems -changes in vision -chest pain or tightness -eye irritation, pain -fast, irregular heartbeat -infection -numbness -speech problems -swallowing problems -unusual weakness Side effects that usually do not require medical attention (report to your doctor or health care professional if they continue or are bothersome): -bruising or pain at site where injected -drooping eyelid -dry eyes or mouth -headache -muscles aches, pains -sensitivity to light -tearing This list may not describe all possible side effects. Call your doctor for medical advice about side effects. You may report side effects to FDA at 1-800-FDA-1088. Where should  I keep my medicine? This drug is given in a hospital or clinic and will not be stored at home. NOTE: This sheet is a summary. It may not cover all possible information. If you have questions about this medicine, talk to your doctor, pharmacist, or health care provider.  2019 Elsevier/Gold Standard (2017-08-09 14:21:42)   Metoclopramide tablets What is this medicine? METOCLOPRAMIDE (met oh kloe PRA mide) is used to  treat the symptoms of gastroesophageal reflux disease (GERD) like heartburn. It is also used to treat people with slow emptying of the stomach and intestinal tract. This medicine may be used for other purposes; ask your health care provider or pharmacist if you have questions. COMMON BRAND NAME(S): Reglan What should I tell my health care provider before I take this medicine? They need to know if you have any of these conditions: -breast cancer -depression -diabetes -heart failure -high blood pressure -kidney disease -liver disease -Parkinson's disease or a movement disorder -pheochromocytoma -seizures -stomach obstruction, bleeding, or perforation -an unusual or allergic reaction to metoclopramide, procainamide, sulfites, other medicines, foods, dyes, or preservatives -pregnant or trying to get pregnant -breast-feeding How should I use this medicine? Take this medicine by mouth with a glass of water. Follow the directions on the prescription label. Take this medicine on an empty stomach, about 30 minutes before eating. Take your doses at regular intervals. Do not take your medicine more often than directed. Do not stop taking except on the advice of your doctor or health care professional. A special MedGuide will be given to you by the pharmacist with each prescription and refill. Be sure to read this information carefully each time. Talk to your pediatrician regarding the use of this medicine in children. Special care may be needed. Overdosage: If you think you have taken too much of this medicine contact a poison control center or emergency room at once. NOTE: This medicine is only for you. Do not share this medicine with others. What if I miss a dose? If you miss a dose, take it as soon as you can. If it is almost time for your next dose, take only that dose. Do not take double or extra doses. What may interact with this medicine? -acetaminophen -cyclosporine -digoxin -medicines  for blood pressure -medicines for diabetes, including insulin -medicines for hay fever and other allergies -medicines for depression, especially a Monoamine Oxidase Inhibitor (MAOI) -medicines for Parkinson's disease, like levodopa -medicines for sleep or for pain -quinidine -tetracycline This list may not describe all possible interactions. Give your health care provider a list of all the medicines, herbs, non-prescription drugs, or dietary supplements you use. Also tell them if you smoke, drink alcohol, or use illegal drugs. Some items may interact with your medicine. What should I watch for while using this medicine? It may take a few weeks for your stomach condition to start to get better. However, do not take this medicine for longer than 12 weeks. The longer you take this medicine, and the more you take it, the greater your chances are of developing serious side effects. If you are an elderly patient, a female patient, or you have diabetes, you may be at an increased risk for side effects from this medicine. Contact your doctor immediately if you start having movements you cannot control such as lip smacking, rapid movements of the tongue, involuntary or uncontrollable movements of the eyes, head, arms and legs, or muscle twitches and spasms. Patients and their families should watch out for worsening depression  or thoughts of suicide. Also watch out for any sudden or severe changes in feelings such as feeling anxious, agitated, panicky, irritable, hostile, aggressive, impulsive, severely restless, overly excited and hyperactive, or not being able to sleep. If this happens, especially at the beginning of treatment or after a change in dose, call your doctor. Do not treat yourself for high fever. Ask your doctor or health care professional for advice. You may get drowsy or dizzy. Do not drive, use machinery, or do anything that needs mental alertness until you know how this drug affects you. Do not  stand or sit up quickly, especially if you are an older patient. This reduces the risk of dizzy or fainting spells. Alcohol can make you more drowsy and dizzy. Avoid alcoholic drinks. What side effects may I notice from receiving this medicine? Side effects that you should report to your doctor or health care professional as soon as possible: -allergic reactions like skin rash, itching or hives, swelling of the face, lips, or tongue -abnormal production of milk in females -breast enlargement in both males and females -change in the way you walk -difficulty moving, speaking or swallowing -drooling, lip smacking, or rapid movements of the tongue -excessive sweating -fever -involuntary or uncontrollable movements of the eyes, head, arms and legs -irregular heartbeat or palpitations -muscle twitches and spasms -unusually weak or tired Side effects that usually do not require medical attention (report to your doctor or health care professional if they continue or are bothersome): -change in sex drive or performance -depressed mood -diarrhea -difficulty sleeping -headache -menstrual changes -restless or nervous This list may not describe all possible side effects. Call your doctor for medical advice about side effects. You may report side effects to FDA at 1-800-FDA-1088. Where should I keep my medicine? Keep out of the reach of children. Store at room temperature between 20 and 25 degrees C (68 and 77 degrees F). Protect from light. Keep container tightly closed. Throw away any unused medicine after the expiration date. NOTE: This sheet is a summary. It may not cover all possible information. If you have questions about this medicine, talk to your doctor, pharmacist, or health care provider.  2019 Elsevier/Gold Standard (2015-11-20 15:13:45)

## 2018-04-02 ENCOUNTER — Telehealth: Payer: Self-pay | Admitting: Neurology

## 2018-04-02 DIAGNOSIS — G43711 Chronic migraine without aura, intractable, with status migrainosus: Secondary | ICD-10-CM | POA: Insufficient documentation

## 2018-04-02 NOTE — Telephone Encounter (Signed)
Danielle, can you please start the Botox approval process for patient? Thank you!

## 2018-04-04 NOTE — Telephone Encounter (Signed)
I called and scheduled the patient. DW  °

## 2018-04-04 NOTE — Telephone Encounter (Signed)
Noted, pt will need to sign consent at her first botox appointment.

## 2018-04-05 ENCOUNTER — Telehealth: Payer: Self-pay | Admitting: Neurology

## 2018-04-05 NOTE — Telephone Encounter (Signed)
Pt's had migraine since taking emgality injection on 2/14. It is starting to run down the back of the head to the neck. She is gets nauseated with movement, light and sound sensitivity. She has used ice pack with no help. She is wanting to know what she could take? Ibuprofen?? Please call to advise

## 2018-04-05 NOTE — Telephone Encounter (Signed)
I spoke with the patient. She thought the Emgality would have started working already and decreased her frequent migraines that she had been having. We discussed this could take 5-6 months to get the full effect however she may start noticing an improvement soon. I advised that pt was given Reglan by Dr. Jaynee Eagles for her headaches, migraines, nausea, vomiting. Advised she can take this three times daily as needed. Advised pt that if she has a migraine that is not her typical, has neurological symptoms or changes such as slurred speech, weakness, etc to call 911 and go to the hospital. She verbalized appreciation and understanding.

## 2018-04-06 ENCOUNTER — Other Ambulatory Visit (INDEPENDENT_AMBULATORY_CARE_PROVIDER_SITE_OTHER): Payer: Medicare Other | Admitting: Neurology

## 2018-04-06 DIAGNOSIS — G40209 Localization-related (focal) (partial) symptomatic epilepsy and epileptic syndromes with complex partial seizures, not intractable, without status epilepticus: Secondary | ICD-10-CM | POA: Diagnosis not present

## 2018-04-07 ENCOUNTER — Ambulatory Visit (HOSPITAL_COMMUNITY): Payer: Self-pay | Admitting: Psychiatry

## 2018-04-08 ENCOUNTER — Other Ambulatory Visit: Payer: Self-pay | Admitting: Family Medicine

## 2018-04-08 DIAGNOSIS — N644 Mastodynia: Secondary | ICD-10-CM

## 2018-04-11 ENCOUNTER — Telehealth: Payer: Self-pay

## 2018-04-11 DIAGNOSIS — I10 Essential (primary) hypertension: Secondary | ICD-10-CM | POA: Diagnosis not present

## 2018-04-11 DIAGNOSIS — G894 Chronic pain syndrome: Secondary | ICD-10-CM | POA: Diagnosis not present

## 2018-04-11 DIAGNOSIS — Z79899 Other long term (current) drug therapy: Secondary | ICD-10-CM | POA: Diagnosis not present

## 2018-04-11 DIAGNOSIS — M797 Fibromyalgia: Secondary | ICD-10-CM | POA: Diagnosis not present

## 2018-04-11 NOTE — Telephone Encounter (Signed)
Patient would like a referral to gyn for pelvic pain and irregular periods.

## 2018-04-12 NOTE — Telephone Encounter (Signed)
Patient will give [name] a call back to see if she can come in tomorrow

## 2018-04-13 ENCOUNTER — Ambulatory Visit (INDEPENDENT_AMBULATORY_CARE_PROVIDER_SITE_OTHER): Payer: Medicare Other | Admitting: Family Medicine

## 2018-04-13 ENCOUNTER — Encounter: Payer: Self-pay | Admitting: Family Medicine

## 2018-04-13 ENCOUNTER — Ambulatory Visit
Admission: RE | Admit: 2018-04-13 | Discharge: 2018-04-13 | Disposition: A | Discharge status: Home / Self Care | Payer: Medicare Other | Source: Ambulatory Visit | Attending: Family Medicine | Admitting: Family Medicine

## 2018-04-13 VITALS — BP 132/88 | HR 96 | Temp 97.8°F | Ht 64.0 in | Wt 186.0 lb

## 2018-04-13 DIAGNOSIS — R8290 Unspecified abnormal findings in urine: Secondary | ICD-10-CM

## 2018-04-13 DIAGNOSIS — R319 Hematuria, unspecified: Secondary | ICD-10-CM | POA: Diagnosis not present

## 2018-04-13 DIAGNOSIS — R102 Pelvic and perineal pain unspecified side: Secondary | ICD-10-CM

## 2018-04-13 DIAGNOSIS — N644 Mastodynia: Secondary | ICD-10-CM

## 2018-04-13 DIAGNOSIS — N390 Urinary tract infection, site not specified: Secondary | ICD-10-CM | POA: Diagnosis not present

## 2018-04-13 DIAGNOSIS — Z09 Encounter for follow-up examination after completed treatment for conditions other than malignant neoplasm: Secondary | ICD-10-CM | POA: Diagnosis not present

## 2018-04-13 DIAGNOSIS — R928 Other abnormal and inconclusive findings on diagnostic imaging of breast: Secondary | ICD-10-CM | POA: Diagnosis not present

## 2018-04-13 DIAGNOSIS — F419 Anxiety disorder, unspecified: Secondary | ICD-10-CM

## 2018-04-13 DIAGNOSIS — N939 Abnormal uterine and vaginal bleeding, unspecified: Secondary | ICD-10-CM | POA: Diagnosis not present

## 2018-04-13 DIAGNOSIS — Z8719 Personal history of other diseases of the digestive system: Secondary | ICD-10-CM | POA: Diagnosis not present

## 2018-04-13 DIAGNOSIS — N6452 Nipple discharge: Secondary | ICD-10-CM | POA: Diagnosis not present

## 2018-04-13 LAB — POCT URINALYSIS DIP (MANUAL ENTRY)
Bilirubin, UA: NEGATIVE
Glucose, UA: NEGATIVE mg/dL
Ketones, POC UA: NEGATIVE mg/dL
Nitrite, UA: NEGATIVE
Protein Ur, POC: NEGATIVE mg/dL
Spec Grav, UA: >=1.03 — AB (ref 1.010–1.025)
Urobilinogen, UA: 0.2 E.U./dL
pH, UA: 5.5 (ref 5.0–8.0)

## 2018-04-13 MED ORDER — SULFAMETHOXAZOLE-TRIMETHOPRIM 800-160 MG PO TABS: 1.0000 | ORAL_TABLET | Freq: Two times a day (BID) | ORAL | 0 refills | Status: DC
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
## 2018-04-13 NOTE — Patient Instructions (Signed)
Urinary Tract Infection, Adult A urinary tract infection (UTI) is an infection of any part of the urinary tract. The urinary tract includes:  The kidneys.  The ureters.  The bladder.  The urethra. These organs make, store, and get rid of pee (urine) in the body. What are the causes? This is caused by germs (bacteria) in your genital area. These germs grow and cause swelling (inflammation) of your urinary tract. What increases the risk? You are more likely to develop this condition if:  You have a small, thin tube (catheter) to drain pee.  You cannot control when you pee or poop (incontinence).  You are female, and: ? You use these methods to prevent pregnancy: ? A medicine that kills sperm (spermicide). ? A device that blocks sperm (diaphragm). ? You have low levels of a female hormone (estrogen). ? You are pregnant.  You have genes that add to your risk.  You are sexually active.  You take antibiotic medicines.  You have trouble peeing because of: ? A prostate that is bigger than normal, if you are female. ? A blockage in the part of your body that drains pee from the bladder (urethra). ? A kidney stone. ? A nerve condition that affects your bladder (neurogenic bladder). ? Not getting enough to drink. ? Not peeing often enough.  You have other conditions, such as: ? Diabetes. ? A weak disease-fighting system (immune system). ? Sickle cell disease. ? Gout. ? Injury of the spine. What are the signs or symptoms? Symptoms of this condition include:  Needing to pee right away (urgently).  Peeing often.  Peeing small amounts often.  Pain or burning when peeing.  Blood in the pee.  Pee that smells bad or not like normal.  Trouble peeing.  Pee that is cloudy.  Fluid coming from the vagina, if you are female.  Pain in the belly or lower back. Other symptoms include:  Throwing up (vomiting).  No urge to eat.  Feeling mixed up (confused).  Being tired  and grouchy (irritable).  A fever.  Watery poop (diarrhea). How is this treated? This condition may be treated with:  Antibiotic medicine.  Other medicines.  Drinking enough water. Follow these instructions at home:  Medicines  Take over-the-counter and prescription medicines only as told by your doctor.  If you were prescribed an antibiotic medicine, take it as told by your doctor. Do not stop taking it even if you start to feel better. General instructions  Make sure you: ? Pee until your bladder is empty. ? Do not hold pee for a long time. ? Empty your bladder after sex. ? Wipe from front to back after pooping if you are a female. Use each tissue one time when you wipe.  Drink enough fluid to keep your pee pale yellow.  Keep all follow-up visits as told by your doctor. This is important. Contact a doctor if:  You do not get better after 1-2 days.  Your symptoms go away and then come back. Get help right away if:  You have very bad back pain.  You have very bad pain in your lower belly.  You have a fever.  You are sick to your stomach (nauseous).  You are throwing up. Summary  A urinary tract infection (UTI) is an infection of any part of the urinary tract.  This condition is caused by germs in your genital area.  There are many risk factors for a UTI. These include having a small, thin   tube to drain pee and not being able to control when you pee or poop.  Treatment includes antibiotic medicines for germs.  Drink enough fluid to keep your pee pale yellow. This information is not intended to replace advice given to you by your health care provider. Make sure you discuss any questions you have with your health care provider. Document Released: 07/22/2007 Document Revised: 08/12/2017 Document Reviewed: 08/12/2017 Elsevier Interactive Patient Education  2019 [place]. Sulfamethoxazole; Trimethoprim, SMX-TMP oral suspension What is this  medicine? SULFAMETHOXAZOLE; TRIMETHOPRIM or SMX-TMP (suhl fuh meth OK suh zohl; trye METH oh prim) is a combination of a sulfonamide antibiotic and a second antibiotic, trimethoprim. It is used to treat or prevent certain kinds of bacterial infections.It will not work for colds, flu, or other viral infections. This medicine may be used for other purposes; ask your health care provider or pharmacist if you have questions. COMMON BRAND NAME(S): Septra, Sulfatrim, Sulfatrim Pediatric, Sultrex Pediatric What should I tell my health care provider before I take this medicine? They need to know if you have any of these conditions: -anemia -asthma -being treated with anticonvulsants -if you frequently drink alcohol containing drinks -kidney disease -liver disease -low level of folic acid or [id number] dehydrogenase -poor nutrition or malabsorption -porphyria -severe allergies -thyroid disorder -an unusual or allergic reaction to sulfamethoxazole, trimethoprim, sulfa drugs, other medicines, foods, dyes, or preservatives -pregnant or trying to get pregnant -breast-feeding How should I use this medicine? Take this suspension by mouth. Follow the directions on the prescription label. Shake the bottle well before taking. Use a specially marked spoon or container to measure your medicine. Ask your pharmacist if you do not have one. Household spoons are not accurate. Take your doses at regular intervals. Do not take more medicine than directed. Talk to your pediatrician regarding the use of this medicine in children. Special care may be needed. While this drug may be prescribed for children as young as 21 months of age for selected conditions, precautions do apply. Overdosage: If you think you have taken too much of this medicine contact a poison control center or emergency room at once. NOTE: This medicine is only for you. Do not share this medicine with others. What if I miss a dose? If you miss  a dose, take it as soon as you can. If it is almost time for your next dose, take only that dose. Do not take double or extra doses. What may interact with this medicine? Do not take this medicine with any of the following medications -aminobenzoate potassium -dofetilide -metronidazole This medicine may also interact with the following medications -ACE inhibitors like benazepril, enalapril, lisinopril, and ramipril -birth control pills -cyclosporine -digoxin -diuretics -indomethacin -medicines for diabetes -methenamine -methotrexate -phenytoin -potassium supplements -pyrimethamine -sulfinpyrazone -tricyclic antidepressants -warfarin This list may not describe all possible interactions. Give your health care provider a list of all the medicines, herbs, non-prescription drugs, or dietary supplements you use. Also tell them if you smoke, drink alcohol, or use illegal drugs. Some items may interact with your medicine. What should I watch for while using this medicine? Tell your doctor or health care professional if your symptoms do not improve. Drink several glasses of water a day to reduce the risk of kidney problems. Do not treat diarrhea with over the counter products. Contact your doctor if you have diarrhea that lasts more than 2 days or if it is severe and watery. This medicine can make you more sensitive to the sun. Keep  out of the sun. If you cannot avoid being in the sun, wear protective clothing and use a sunscreen. Do not use sun lamps or tanning beds/booths. What side effects may I notice from receiving this medicine? Side effects that you should report to your doctor or health care professional as soon as possible: -allergic reactions like skin rash or hives, swelling of the face, lips, or tongue -breathing problems -fever or chills, sore throat -irregular heartbeat, chest pain -joint or muscle pain -pain or difficulty passing urine -red pinpoint spots on skin -redness,  blistering, peeling or loosening of the skin, including inside the mouth -unusual bleeding or bruising -unusual weakness or tiredness -yellowing of the eyes or skin Side effects that usually do not require medical attention (report to your doctor or health care professional if they continue or are bothersome): -diarrhea -dizziness -headache -loss of appetite -nausea, vomiting -nervousness This list may not describe all possible side effects. Call your doctor for medical advice about side effects. You may report side effects to FDA at 1-800-FDA-1088. Where should I keep my medicine? Keep out of the reach of children. Store at room temperature between 15 and 25 degrees C (59 and 77 degrees F). Protect from light and moisture. Throw away any unused medicine after the expiration date. NOTE: This sheet is a summary. It may not cover all possible information. If you have questions about this medicine, talk to your doctor, pharmacist, or health care provider.  2019 Elsevier/Gold Standard (2012-09-09 14:37:40)

## 2018-04-14 ENCOUNTER — Other Ambulatory Visit: Payer: Self-pay

## 2018-04-14 ENCOUNTER — Ambulatory Visit (HOSPITAL_COMMUNITY): Payer: Self-pay | Admitting: Psychiatry

## 2018-04-14 LAB — IFOBT (OCCULT BLOOD): IFOBT: NEGATIVE

## 2018-04-15 LAB — URINE CULTURE

## 2018-04-16 LAB — NUSWAB VAGINITIS PLUS (VG+)
Atopobium vaginae: HIGH Score — AB
BVAB 2: HIGH Score — AB
Candida albicans, NAA: NEGATIVE
Candida glabrata, NAA: NEGATIVE
Chlamydia trachomatis, NAA: NEGATIVE
Megasphaera 1: HIGH Score — AB
Neisseria gonorrhoeae, NAA: NEGATIVE
Trich vag by NAA: NEGATIVE

## 2018-04-18 ENCOUNTER — Other Ambulatory Visit: Payer: Self-pay | Admitting: Neurology

## 2018-04-18 ENCOUNTER — Telehealth: Payer: Self-pay | Admitting: Family Medicine

## 2018-04-18 ENCOUNTER — Telehealth: Payer: Self-pay | Admitting: Neurology

## 2018-04-18 DIAGNOSIS — IMO0002 Reserved for concepts with insufficient information to code with codable children: Secondary | ICD-10-CM

## 2018-04-18 DIAGNOSIS — R569 Unspecified convulsions: Secondary | ICD-10-CM

## 2018-04-18 DIAGNOSIS — G43709 Chronic migraine without aura, not intractable, without status migrainosus: Secondary | ICD-10-CM

## 2018-04-18 DIAGNOSIS — S069X5S Unspecified intracranial injury with loss of consciousness greater than 24 hours with return to pre-existing conscious level, sequela: Secondary | ICD-10-CM

## 2018-04-18 DIAGNOSIS — R9401 Abnormal electroencephalogram [EEG]: Secondary | ICD-10-CM | POA: Insufficient documentation

## 2018-04-18 DIAGNOSIS — S020XXS Fracture of vault of skull, sequela: Secondary | ICD-10-CM

## 2018-04-18 NOTE — Telephone Encounter (Signed)
The system says she has [employer] but we only have a medicare card scanned in. I tried calling several times but phone is disconnected I have also sent a mychart message. DW    I called [employer] and asked if the patient was active and she was. XNR#[id number] Botox does require an [place] [phone]. Pending [phone]

## 2018-04-18 NOTE — Telephone Encounter (Signed)
Attempted to reach patient concerning lab results. Left voicemail message on mother's phone to call office.

## 2018-04-18 NOTE — Telephone Encounter (Signed)
Please share results with the patient.
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
## 2018-04-19 ENCOUNTER — Other Ambulatory Visit: Payer: Self-pay | Admitting: Surgery

## 2018-04-19 DIAGNOSIS — N6452 Nipple discharge: Secondary | ICD-10-CM | POA: Diagnosis not present

## 2018-04-19 NOTE — Telephone Encounter (Signed)
I called to check status of the Botox, it has been approved [id number] (02/16/19)

## 2018-04-19 NOTE — Telephone Encounter (Signed)
Called the patient and reviewed the EEG results with her. Patient verbalized understanding and had no questions.

## 2018-04-20 ENCOUNTER — Other Ambulatory Visit: Payer: Self-pay | Admitting: Surgery

## 2018-04-20 DIAGNOSIS — N6452 Nipple discharge: Secondary | ICD-10-CM

## 2018-04-22 ENCOUNTER — Ambulatory Visit: Payer: Medicare HMO | Admitting: Neurology

## 2018-04-22 DIAGNOSIS — G43711 Chronic migraine without aura, intractable, with status migrainosus: Secondary | ICD-10-CM

## 2018-04-22 DIAGNOSIS — M7918 Myalgia, other site: Secondary | ICD-10-CM

## 2018-04-22 NOTE — Progress Notes (Signed)
Consent Form Botulism Toxin Injection For Chronic Migraine  Dry Needling for cervical myofascial pain syndrome and migraines:  Orders Placed This Encounter  Procedures  . Ambulatory referral to Physical Therapy   +all  Reviewed orally with patient, additionally signature is on file:  Botulism toxin has been approved by the Federal drug administration for treatment of chronic migraine. Botulism toxin does not cure chronic migraine and it may not be effective in some patients.  The administration of botulism toxin is accomplished by injecting a small amount of toxin into the muscles of the neck and head. Dosage must be titrated for each individual. Any benefits resulting from botulism toxin tend to wear off after 3 months with a repeat injection required if benefit is to be maintained. Injections are usually done every 3-4 months with maximum effect peak achieved by about 2 or 3 weeks. Botulism toxin is expensive and you should be sure of what costs you will incur resulting from the injection.  The side effects of botulism toxin use for chronic migraine may include:   -Transient, and usually mild, facial weakness with facial injections  -Transient, and usually mild, head or neck weakness with head/neck injections  -Reduction or loss of forehead facial animation due to forehead muscle weakness  -Eyelid drooping  -Dry eye  -Pain at the site of injection or bruising at the site of injection  -Double vision  -Potential unknown long term risks  Contraindications: You should not have Botox if you are pregnant, nursing, allergic to albumin, have an infection, skin condition, or muscle weakness at the site of the injection, or have myasthenia gravis, Lambert-Eaton syndrome, or ALS.  It is also possible that as with any injection, there may be an allergic reaction or no effect from the medication. Reduced effectiveness after repeated injections is sometimes seen and rarely infection at the  injection site may occur. All care will be taken to prevent these side effects. If therapy is given over a long time, atrophy and wasting in the muscle injected may occur. Occasionally the patient's become refractory to treatment because they develop antibodies to the toxin. In this event, therapy needs to be modified.  I have read the above information and consent to the administration of botulism toxin.    BOTOX PROCEDURE NOTE FOR MIGRAINE HEADACHE    Contraindications and precautions discussed with patient(above). Aseptic procedure was observed and patient tolerated procedure. Procedure performed by Dr. Georgia Dom  The condition has existed for more than 6 months, and pt does not have a diagnosis of ALS, Myasthenia Gravis or Lambert-Eaton Syndrome.  Risks and benefits of injections discussed and pt agrees to proceed with the procedure.  Written consent obtained  These injections are medically necessary. Pt  receives good benefits from these injections. These injections do not cause sedations or hallucinations which the oral therapies may cause.  Description of procedure:  The patient was placed in a sitting position. The standard protocol was used for Botox as follows, with 5 units of Botox injected at each site:   -Procerus muscle, midline injection  -Corrugator muscle, bilateral injection  -Frontalis muscle, bilateral injection, with 2 sites each side, medial injection was performed in the upper one third of the frontalis muscle, in the region vertical from the medial inferior edge of the superior orbital rim. The lateral injection was again in the upper one third of the forehead vertically above the lateral limbus of the cornea, 1.5 cm lateral to the medial injection site.  -  Levator Scapulae: 5 units bilaterally  -Temporalis muscle injection, 5 sites, bilaterally. The first injection was 3 cm above the tragus of the ear, second injection site was 1.5 cm to 3 cm up from the first  injection site in line with the tragus of the ear. The third injection site was 1.5-3 cm forward between the first 2 injection sites. The fourth injection site was 1.5 cm posterior to the second injection site. 5th site laterally in the temporalis  muscleat the level of the outer canthus.  - Patient feels her clenching is a trigger for headaches. +5 units masseter bilaterally   - Patient feels the migraines are centered around the eyes +5 units bilaterally at the outer canthus in the orbicularis occuli  -Occipitalis muscle injection, 3 sites, bilaterally. The first injection was done one half way between the occipital protuberance and the tip of the mastoid process behind the ear. The second injection site was done lateral and superior to the first, 1 fingerbreadth from the first injection. The third injection site was 1 fingerbreadth superiorly and medially from the first injection site.  -Cervical paraspinal muscle injection, 2 sites, bilateral knee first injection site was 1 cm from the midline of the cervical spine, 3 cm inferior to the lower border of the occipital protuberance. The second injection site was 1.5 cm superiorly and laterally to the first injection site.  -Trapezius muscle injection was performed at 3 sites, bilaterally. The first injection site was in the upper trapezius muscle halfway between the inflection point of the neck, and the acromion. The second injection site was one half way between the acromion and the first injection site. The third injection was done between the first injection site and the inflection point of the neck.   Will return for repeat injection in 3 months.   A 200 unit sof Botox was used, any Botox not injected was wasted. The patient tolerated the procedure well, there were no complications of the above procedure.

## 2018-04-22 NOTE — Progress Notes (Signed)
Botox- 100 units x 2 vials Lot: [id number] Expiration: 09/2020 NDC: 0023-1145-01  Bacteriostatic 0.9% Sodium Chloride- 4mL total Lot: [id number] Expiration: 11/17/2018 NDC: 0409-1966-02  Dx: G43.711 B/B   

## 2018-04-25 ENCOUNTER — Ambulatory Visit (HOSPITAL_COMMUNITY): Payer: Self-pay | Admitting: Psychiatry

## 2018-04-29 ENCOUNTER — Other Ambulatory Visit: Payer: Self-pay | Admitting: Family Medicine

## 2018-04-29 DIAGNOSIS — B9689 Other specified bacterial agents as the cause of diseases classified elsewhere: Secondary | ICD-10-CM

## 2018-04-29 DIAGNOSIS — N760 Acute vaginitis: Principal | ICD-10-CM

## 2018-04-29 MED ORDER — METRONIDAZOLE 500 MG PO TABS: 500.0000 mg | ORAL_TABLET | Freq: Two times a day (BID) | ORAL | 0 refills | Status: AC

## 2018-05-02 ENCOUNTER — Telehealth: Payer: Self-pay | Admitting: Family Medicine

## 2018-05-02 NOTE — Telephone Encounter (Signed)
Spoke to patient today concerning follow up for MR bilateral breast exam. Patient is aware is scheduled for 05/05/2018.

## 2018-05-03 ENCOUNTER — Ambulatory Visit: Payer: Medicare HMO | Admitting: Physical Therapy

## 2018-05-05 ENCOUNTER — Ambulatory Visit
Admission: RE | Admit: 2018-05-05 | Discharge: 2018-05-05 | Disposition: A | Discharge status: Home / Self Care | Payer: Medicare HMO | Source: Ambulatory Visit | Attending: Surgery | Admitting: Surgery

## 2018-05-05 ENCOUNTER — Other Ambulatory Visit: Payer: Self-pay

## 2018-05-05 DIAGNOSIS — N6452 Nipple discharge: Secondary | ICD-10-CM

## 2018-05-05 MED ORDER — GADOBUTROL 1 MMOL/ML IV SOLN
7.0000 mL | Freq: Once | INTRAVENOUS | Status: AC | PRN
  Administered 2018-05-05: 9 mL via INTRAVENOUS

## 2018-05-07 ENCOUNTER — Encounter (HOSPITAL_COMMUNITY): Payer: Self-pay

## 2018-05-07 ENCOUNTER — Other Ambulatory Visit: Payer: Self-pay

## 2018-05-07 ENCOUNTER — Emergency Department (HOSPITAL_COMMUNITY): Payer: Medicare HMO

## 2018-05-07 ENCOUNTER — Emergency Department (HOSPITAL_COMMUNITY)
Admission: EM | Admit: 2018-05-07 | Discharge: 2018-05-08 | Disposition: A | Discharge status: Home / Self Care | Payer: Medicare HMO | Attending: Emergency Medicine | Admitting: Emergency Medicine

## 2018-05-07 DIAGNOSIS — R1013 Epigastric pain: Secondary | ICD-10-CM | POA: Insufficient documentation

## 2018-05-07 DIAGNOSIS — Z79899 Other long term (current) drug therapy: Secondary | ICD-10-CM | POA: Diagnosis not present

## 2018-05-07 DIAGNOSIS — K5900 Constipation, unspecified: Secondary | ICD-10-CM | POA: Diagnosis not present

## 2018-05-07 DIAGNOSIS — K209 Esophagitis, unspecified without bleeding: Secondary | ICD-10-CM

## 2018-05-07 DIAGNOSIS — K439 Ventral hernia without obstruction or gangrene: Secondary | ICD-10-CM | POA: Diagnosis not present

## 2018-05-07 DIAGNOSIS — R112 Nausea with vomiting, unspecified: Secondary | ICD-10-CM | POA: Diagnosis not present

## 2018-05-07 DIAGNOSIS — R1010 Upper abdominal pain, unspecified: Secondary | ICD-10-CM | POA: Diagnosis present

## 2018-05-07 LAB — URINALYSIS, ROUTINE W REFLEX MICROSCOPIC
Bilirubin Urine: NEGATIVE
Glucose, UA: NEGATIVE mg/dL
Hgb urine dipstick: NEGATIVE
Ketones, ur: NEGATIVE mg/dL
LEUKOCYTE UA: NEGATIVE
Nitrite: NEGATIVE
Protein, ur: NEGATIVE mg/dL
Specific Gravity, Urine: 1.026 (ref 1.005–1.030)
pH: 5 (ref 5.0–8.0)

## 2018-05-07 LAB — CBC
HCT: 41.8 % (ref 36.0–46.0)
HEMOGLOBIN: 13.9 g/dL (ref 12.0–15.0)
MCH: 29.5 pg (ref 26.0–34.0)
MCHC: 33.3 g/dL (ref 30.0–36.0)
MCV: 88.7 fL (ref 80.0–100.0)
NRBC: 0 % (ref 0.0–0.2)
Platelets: 276 10*3/uL (ref 150–400)
RBC: 4.71 MIL/uL (ref 3.87–5.11)
RDW: 13.3 % (ref 11.5–15.5)
WBC: 10.2 10*3/uL (ref 4.0–10.5)

## 2018-05-07 LAB — COMPREHENSIVE METABOLIC PANEL
ALT: 45 U/L — ABNORMAL HIGH (ref 0–44)
ANION GAP: 12 (ref 5–15)
AST: 43 U/L — ABNORMAL HIGH (ref 15–41)
Albumin: 4.3 g/dL (ref 3.5–5.0)
Alkaline Phosphatase: 66 U/L (ref 38–126)
BUN: 18 mg/dL (ref 6–20)
CO2: 15 mmol/L — ABNORMAL LOW (ref 22–32)
Calcium: 9.1 mg/dL (ref 8.9–10.3)
Chloride: 109 mmol/L (ref 98–111)
Creatinine, Ser: 1.19 mg/dL — ABNORMAL HIGH (ref 0.44–1.00)
GFR calc non Af Amer: 56 mL/min — ABNORMAL LOW (ref >60)
Glucose, Bld: 146 mg/dL — ABNORMAL HIGH (ref 70–99)
POTASSIUM: 3.4 mmol/L — AB (ref 3.5–5.1)
Sodium: 136 mmol/L (ref 135–145)
Total Bilirubin: 0.6 mg/dL (ref 0.3–1.2)
Total Protein: 7.4 g/dL (ref 6.5–8.1)

## 2018-05-07 LAB — LIPASE, BLOOD: Lipase: 32 U/L (ref 11–51)

## 2018-05-07 MED ORDER — PANTOPRAZOLE SODIUM 40 MG IV SOLR
40.0000 mg | Freq: Once | INTRAVENOUS | Status: AC
  Administered 2018-05-07: 40 mg via INTRAVENOUS
  Filled 2018-05-07: qty 40

## 2018-05-07 MED ORDER — SODIUM CHLORIDE 0.9% FLUSH
3.0000 mL | Freq: Once | INTRAVENOUS | Status: AC
  Administered 2018-05-07: 3 mL via INTRAVENOUS

## 2018-05-07 MED ORDER — IOHEXOL 300 MG/ML  SOLN
100.0000 mL | Freq: Once | INTRAMUSCULAR | Status: AC | PRN
  Administered 2018-05-07: 100 mL via INTRAVENOUS

## 2018-05-07 MED ORDER — FAMOTIDINE IN NACL 20-0.9 MG/50ML-% IV SOLN
20.0000 mg | Freq: Once | INTRAVENOUS | Status: AC
  Administered 2018-05-07: 20 mg via INTRAVENOUS
  Filled 2018-05-07: qty 50
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
## 2018-05-07 NOTE — ED Triage Notes (Signed)
Pt presents to ED with complaints of upper abdominal pain for 2 weeks. Pt also c/o heartburn and reflux. Pt with N/V. Pt states she has vomited x3 today.

## 2018-05-08 ENCOUNTER — Ambulatory Visit (HOSPITAL_COMMUNITY)
Admission: RE | Admit: 2018-05-08 | Discharge: 2018-05-08 | Disposition: A | Discharge status: Home / Self Care | Payer: Medicare HMO | Source: Ambulatory Visit | Attending: Physician Assistant | Admitting: Physician Assistant

## 2018-05-08 DIAGNOSIS — K824 Cholesterolosis of gallbladder: Secondary | ICD-10-CM | POA: Diagnosis not present

## 2018-05-08 DIAGNOSIS — K439 Ventral hernia without obstruction or gangrene: Secondary | ICD-10-CM | POA: Diagnosis not present

## 2018-05-08 MED ORDER — PANTOPRAZOLE SODIUM 20 MG PO TBEC: 20.0000 mg | DELAYED_RELEASE_TABLET | Freq: Every day | ORAL | 1 refills | Status: DC

## 2018-05-08 MED ORDER — FAMOTIDINE 20 MG PO TABS: 20.0000 mg | ORAL_TABLET | Freq: Two times a day (BID) | ORAL | 0 refills | Status: DC

## 2018-05-08 MED ORDER — MAGNESIUM HYDROXIDE 400 MG/5ML PO SUSP
30.0000 mL | Freq: Once | ORAL | Status: AC
  Administered 2018-05-08: 30 mL via ORAL
  Filled 2018-05-08: qty 30

## 2018-05-08 NOTE — Discharge Instructions (Addendum)
Your lab work is nonacute at this time.  Your CT scan suggests some irritation of your esophagus consistent with esophagitis.  There is evidence of constipation present.  On your examination you had pain in the epigastric area.  Review of your labs and imaging studies showed a polyp with sludge present in your gallbladder approximately 5 or 6 months ago.  An outpatient ultrasound has been ordered.  Someone from the radiology department will call you with a time later today for your ultrasound.  Please increase leafy green vegetables, bran, and other foods that will help eliminate your bowels.  Please increase water and juices.  Please use Pepcid 2 times daily.  Please use Protonix daily.  Please avoid spicy and fried foods.  Please avoid eating after 8:00 at night.  There is also noted a small hernia present.  You may benefit from having your head of your bed elevated approximately 6 inches.  May use Tylenol extra strength for discomfort.

## 2018-05-09 ENCOUNTER — Other Ambulatory Visit: Payer: Self-pay | Admitting: Surgery

## 2018-05-09 DIAGNOSIS — R9389 Abnormal findings on diagnostic imaging of other specified body structures: Secondary | ICD-10-CM

## 2018-05-13 ENCOUNTER — Other Ambulatory Visit: Payer: Self-pay

## 2018-05-13 ENCOUNTER — Encounter: Payer: Self-pay | Admitting: Family Medicine

## 2018-05-13 ENCOUNTER — Ambulatory Visit (INDEPENDENT_AMBULATORY_CARE_PROVIDER_SITE_OTHER): Payer: Medicare HMO | Admitting: Family Medicine

## 2018-05-13 DIAGNOSIS — Z8719 Personal history of other diseases of the digestive system: Secondary | ICD-10-CM | POA: Insufficient documentation

## 2018-05-13 DIAGNOSIS — K2100 Gastro-esophageal reflux disease with esophagitis, without bleeding: Secondary | ICD-10-CM

## 2018-05-13 DIAGNOSIS — F419 Anxiety disorder, unspecified: Secondary | ICD-10-CM | POA: Insufficient documentation

## 2018-05-13 MED ORDER — LINACLOTIDE 72 MCG PO CAPS: 72.0000 ug | ORAL_CAPSULE | Freq: Every day | ORAL | 3 refills | Status: DC

## 2018-05-13 NOTE — Progress Notes (Signed)
Virtual Visit via Telephone Note  I connected with Kristin Braun on 05/13/18 at 10:20 AM EDT by telephone and verified that I am speaking with the correct person using two identifiers.   I discussed the limitations, risks, security and privacy concerns of performing an evaluation and management service by telephone and the availability of in person appointments. I also discussed with the patient that there may be a patient responsible charge related to this service. The patient expressed understanding and agreed to proceed.   History of Present Illness:  Past Medical History:  Diagnosis Date  . Anxiety   . Bipolar 1 disorder ([place])    [facility]- Dr. [name].- sees him q 6-8 weeks  . Chronic pain syndrome   . 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 reports that stress flares the seizure activity   . Family history of colon cancer   . Family history of lung cancer   . Family history of ovarian cancer 12/15/2016  . Fibromyalgia   . GERD (gastroesophageal reflux disease)   . Headache    migraines   . History of hiatal hernia   . History of kidney stones    passed spontaneously  . Hypertension    showing ^ BP, pt. relates to anxiety, reports that she has never had tx for BP or any heart related problems.   . Osteoarthritis    everywhere- - hips & hands   . Osteoporosis   . PTSD (post-traumatic stress disorder)   . Sclerosing adenosis of breast, left   . Seizures ([place])    last seizure 10-12-16, petit mal, Dr Roddie Mc aware  . Sleep apnea   . TBI (traumatic brain injury) ([place])    plate on L side of head   . Thyroid disease     Current Status: Since her last office visit, she has had an ED visit. She is doing well with no complaints. She continues to have problems with bowel movements. She states that she is passing gas. Her last bowel movement was 05/07/2018, with hard stools. She continues to take stool softeners with no relief. No reports of GI problems such  as diarrhea.  She has no reports of blood in stools, dysuria and hematuria. She will be having MRI breast biopsy on 05/20/2018. Her anxiety is moderate today. She denies suicidal ideations, homicidal ideations, or auditory hallucinations.   She denies fevers, chills, fatigue, recent infections, weight loss, and night sweats. She has not had any headaches, visual changes, dizziness, and falls. No chest pain, heart palpitations, cough and shortness of breath reported. She denies pain today.    Observations/Objective:  Telephone Virtual Visit  Assessment and Plan:  1. History of chronic constipation - linaclotide (LINZESS) 72 MCG capsule; Take 1 capsule (72 mcg total) by mouth daily before breakfast.  Dispense: 30 capsule; Refill: 3 - Ambulatory referral to Gastroenterology  2. Gastroesophageal reflux disease with esophagitis Continue Pantoprazole as prescribed. We will initiate Linzess today.  - Ambulatory referral to Gastroenterology  3. Anxiety Moderate. She will continue Seroquel as prescribed.    Follow Up Instructions:  She will follow up on 09/2018.    I discussed the assessment and treatment plan with the patient. The patient was provided an opportunity to ask questions and all were answered. The patient agreed with the plan and demonstrated an understanding of the instructions.   The patient was advised to call back or seek an in-person evaluation if the symptoms worsen or if  the condition fails to improve as anticipated.  I provided 15-20 minutes of non-face-to-face time during this encounter.   [name], FNP

## 2018-05-16 ENCOUNTER — Encounter (HOSPITAL_COMMUNITY): Payer: Self-pay | Admitting: Psychiatry

## 2018-05-16 ENCOUNTER — Other Ambulatory Visit: Payer: Self-pay

## 2018-05-16 ENCOUNTER — Ambulatory Visit (INDEPENDENT_AMBULATORY_CARE_PROVIDER_SITE_OTHER): Payer: Self-pay | Admitting: Psychiatry

## 2018-05-16 VITALS — BP 122/85 | HR 91 | Temp 97.7°F | Ht 64.0 in | Wt 189.0 lb

## 2018-05-16 DIAGNOSIS — Z79899 Other long term (current) drug therapy: Secondary | ICD-10-CM

## 2018-05-16 DIAGNOSIS — F4312 Post-traumatic stress disorder, chronic: Secondary | ICD-10-CM

## 2018-05-16 DIAGNOSIS — F3162 Bipolar disorder, current episode mixed, moderate: Secondary | ICD-10-CM

## 2018-05-16 DIAGNOSIS — M1990 Unspecified osteoarthritis, unspecified site: Secondary | ICD-10-CM | POA: Diagnosis not present

## 2018-05-16 DIAGNOSIS — G894 Chronic pain syndrome: Secondary | ICD-10-CM | POA: Diagnosis not present

## 2018-05-16 DIAGNOSIS — M797 Fibromyalgia: Secondary | ICD-10-CM | POA: Diagnosis not present

## 2018-05-16 DIAGNOSIS — R6889 Other general symptoms and signs: Secondary | ICD-10-CM | POA: Diagnosis not present

## 2018-05-16 DIAGNOSIS — G4700 Insomnia, unspecified: Secondary | ICD-10-CM

## 2018-05-16 MED ORDER — TRAZODONE HCL 100 MG PO TABS: 100.0000 mg | ORAL_TABLET | Freq: Every evening | ORAL | 0 refills | Status: DC | PRN

## 2018-05-16 MED ORDER — CARIPRAZINE HCL 3 MG PO CAPS: 3.0000 mg | ORAL_CAPSULE | Freq: Every day | ORAL | 0 refills | Status: DC

## 2018-05-16 MED ORDER — OXCARBAZEPINE 150 MG PO TABS: 150.0000 mg | ORAL_TABLET | Freq: Two times a day (BID) | ORAL | 0 refills | Status: DC

## 2018-05-16 MED ORDER — CLONAZEPAM 0.5 MG PO TABS: 0.5000 mg | ORAL_TABLET | Freq: Three times a day (TID) | ORAL | 0 refills | Status: DC | PRN
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
## 2018-05-16 NOTE — Patient Instructions (Signed)
Plan:  1. Discontinue hydroxyzine 50 mg (for anxiety).  2. Increase dose of trazodone to 100 mg at bedtime as needed for sleep.  3. Taper off Seroquel: take 400 mg x 3 days, the  Start taking 1/2 tablet (200 mg) x 4 days, then stop.  4. We will ad oxcarbazepine (Trileptal) as an additional modo stabilizer. Please take one pill (150 mg) twice daily.  5. We will start clonazepam 0.5 mg tree times daily for anxiety.  6. We Sheppard Plumber for mood/hallucinations: take 1.5 mg x 2 days then start taking 3 mg daily - sample.

## 2018-05-17 ENCOUNTER — Telehealth: Payer: Self-pay

## 2018-05-17 NOTE — Telephone Encounter (Signed)
Patient has not had a bowel movement since 05/07/2018. Patient has been taking the [place] since the 3/28. Patient states that she is having bad stomach cramps. She has been drinking plenty of fluids and eating vegetables. Patient states that her stomach is distended. Patient was given information to [facility].

## 2018-05-18 ENCOUNTER — Ambulatory Visit: Payer: Medicare HMO | Admitting: Physical Therapy

## 2018-05-18 ENCOUNTER — Ambulatory Visit (INDEPENDENT_AMBULATORY_CARE_PROVIDER_SITE_OTHER): Payer: Medicare HMO | Admitting: Gastroenterology

## 2018-05-18 ENCOUNTER — Other Ambulatory Visit: Payer: Self-pay

## 2018-05-18 DIAGNOSIS — K219 Gastro-esophageal reflux disease without esophagitis: Secondary | ICD-10-CM

## 2018-05-18 DIAGNOSIS — K824 Cholesterolosis of gallbladder: Secondary | ICD-10-CM

## 2018-05-18 DIAGNOSIS — R748 Abnormal levels of other serum enzymes: Secondary | ICD-10-CM

## 2018-05-18 DIAGNOSIS — R1013 Epigastric pain: Secondary | ICD-10-CM | POA: Diagnosis not present

## 2018-05-18 DIAGNOSIS — Z8719 Personal history of other diseases of the digestive system: Secondary | ICD-10-CM

## 2018-05-18 DIAGNOSIS — R1310 Dysphagia, unspecified: Secondary | ICD-10-CM

## 2018-05-18 MED ORDER — PANTOPRAZOLE SODIUM 40 MG PO TBEC: 40.0000 mg | DELAYED_RELEASE_TABLET | Freq: Two times a day (BID) | ORAL | 3 refills | Status: DC

## 2018-05-18 MED ORDER — LINACLOTIDE 72 MCG PO CAPS: 144.0000 ug | ORAL_CAPSULE | Freq: Every day | ORAL | 3 refills | Status: DC

## 2018-05-18 MED ORDER — SUCRALFATE 1 GM/10ML PO SUSP: 1.0000 g | Freq: Four times a day (QID) | ORAL | 1 refills | Status: DC | PRN

## 2018-05-18 NOTE — Patient Instructions (Addendum)
If you are age 44 or older, your body mass index should be between 23-30. Your There is no height or weight on file to calculate BMI. If this is out of the aforementioned range listed, please consider follow up with your Primary Care Provider.  If you are age 44 or younger, your body mass index should be between 19-25. Your There is no height or weight on file to calculate BMI. If this is out of the aformentioned range listed, please consider follow up with your Primary Care Provider.   You have been scheduled for a Barium Esophogram at [facility] (1st floor of the hospital) on Tuesday, 06-14-2018 at 11:00am. Please arrive 15 minutes prior to your appointment for registration. Make certain not to have anything to eat or drink 3 hours prior to your test. If you need to reschedule for any reason, please contact radiology at [phone] to do so. ______________________________________________________________ A barium swallow is an examination that concentrates on views of the esophagus. This tends to be a double contrast exam (barium and two liquids which, when combined, create a gas to distend the wall of the oesophagus) or single contrast (non-ionic iodine based). The study is usually tailored to your symptoms so a good history is essential. Attention is paid during the study to the form, structure and configuration of the esophagus, looking for functional disorders (such as aspiration, dysphagia, achalasia, motility and reflux) EXAMINATION You may be asked to change into a gown, depending on the type of swallow being performed. A radiologist and radiographer will perform the procedure. The radiologist will advise you of the type of contrast selected for your procedure and direct you during the exam. You will be asked to stand, sit or lie in several different positions and to hold a small amount of fluid in your mouth before being asked to swallow while the imaging is performed .In some instances  you may be asked to swallow barium coated marshmallows to assess the motility of a solid food bolus. The exam can be recorded as a digital or video fluoroscopy procedure. POST PROCEDURE It will take 1-2 days for the barium to pass through your system. To facilitate this, it is important, unless otherwise directed, to increase your fluids for the next 24-48hrs and to resume your normal diet.  This test typically takes about 30 minutes to perform. _____________________________________________________________  We have sent the following medications to your pharmacy for you to pick up at your convenience: Protonix 40mg : Take twice a day  Carafate Suspension: Take 39ml every 6 hours as needed  Purchase Fleet enemas over the counter.  Use 2.  Mix 32 ounces of Gatorade with 119 grams of Miralax. Refrigerate until cold. Once you begin to get output from the fleet enemas,  drink 8 ounces of the Miralax/Gatorade mixture every 15 minutes until it is gone.    The following day, increase your Linzess 15mcg to 2 tablets daily.  Please go to the lab in the basement of our building to have lab work done. They are open Monday thru Friday 8:00am to 4:00pm. We will call you with the results. Thank you.   Thank you for entrusting me with your care and for choosing [facility], Dr. [place] Cellar
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
## 2018-05-19 ENCOUNTER — Telehealth: Payer: Self-pay | Admitting: Gastroenterology

## 2018-05-19 NOTE — Telephone Encounter (Signed)
Results from prior workup came in:  EGD 01/2016 - normal esophagus, empiric dilation done with 25 Fr Maloney, 2cm HH, , normal stomach and duodenum Colonoscopy 01/2016 - normal colon and ileum  Will await results of barium study and her course as outlined in clinic note.

## 2018-05-20 ENCOUNTER — Ambulatory Visit
Admission: RE | Admit: 2018-05-20 | Discharge: 2018-05-20 | Disposition: A | Discharge status: Home / Self Care | Payer: Medicare HMO | Source: Ambulatory Visit | Attending: Surgery | Admitting: Surgery

## 2018-05-20 ENCOUNTER — Other Ambulatory Visit: Payer: Self-pay

## 2018-05-20 ENCOUNTER — Other Ambulatory Visit: Payer: Self-pay | Admitting: Surgery

## 2018-05-20 DIAGNOSIS — R9389 Abnormal findings on diagnostic imaging of other specified body structures: Secondary | ICD-10-CM

## 2018-05-20 DIAGNOSIS — N6091 Unspecified benign mammary dysplasia of right breast: Secondary | ICD-10-CM | POA: Diagnosis not present

## 2018-05-20 DIAGNOSIS — N6011 Diffuse cystic mastopathy of right breast: Secondary | ICD-10-CM | POA: Diagnosis not present

## 2018-05-20 MED ORDER — GADOBUTROL 1 MMOL/ML IV SOLN
9.0000 mL | Freq: Once | INTRAVENOUS | Status: AC | PRN
  Administered 2018-05-20: 9 mL via INTRAVENOUS

## 2018-05-23 ENCOUNTER — Ambulatory Visit: Payer: Medicare HMO | Admitting: Physical Therapy

## 2018-05-26 ENCOUNTER — Telehealth: Payer: Self-pay

## 2018-05-26 NOTE — Telephone Encounter (Signed)
FYI-Biopsy was abnormal and she has appointment with Dr. Ninfa Linden 06/06/2018 at [facility]

## 2018-05-30 ENCOUNTER — Ambulatory Visit
Admission: RE | Admit: 2018-05-30 | Discharge: 2018-05-30 | Disposition: A | Discharge status: Home / Self Care | Payer: Medicare HMO | Source: Ambulatory Visit | Attending: Neurology | Admitting: Neurology

## 2018-05-30 ENCOUNTER — Other Ambulatory Visit: Payer: Self-pay | Admitting: Neurology

## 2018-05-30 ENCOUNTER — Other Ambulatory Visit: Payer: Self-pay

## 2018-05-30 ENCOUNTER — Telehealth: Payer: Self-pay

## 2018-05-30 DIAGNOSIS — N6452 Nipple discharge: Secondary | ICD-10-CM

## 2018-05-30 NOTE — Telephone Encounter (Signed)
Patient would like provider to call regarding pap smear results.  Stated she remembers they stated they "felt something" during the exam.  She also is having some spotting when bearing down while having a bowel movement and has had 2 periods this month.

## 2018-05-31 ENCOUNTER — Other Ambulatory Visit (HOSPITAL_COMMUNITY): Payer: Self-pay | Admitting: Psychiatry

## 2018-05-31 ENCOUNTER — Telehealth: Payer: Self-pay | Admitting: Gastroenterology

## 2018-05-31 ENCOUNTER — Telehealth (HOSPITAL_COMMUNITY): Payer: Self-pay

## 2018-05-31 MED ORDER — ARIPIPRAZOLE 10 MG PO TABS: 10.0000 mg | ORAL_TABLET | Freq: Every day | ORAL | 0 refills | Status: DC

## 2018-05-31 NOTE — Telephone Encounter (Signed)
I ordered Abilify 10 mg (to begin with), not really a good alternative but she already failed Seroquel and Latuda and needs something for depression, anxiety, sleep and hallucinations. She is also on Trileptal for mood stabilization. OP

## 2018-05-31 NOTE — Telephone Encounter (Signed)
Insurance denied the [place], please provide alternative.

## 2018-05-31 NOTE — Telephone Encounter (Signed)
Pt called just to let us know that her barium study apt has been moved to 07-18-18 and just wants to update [name] on that.

## 2018-05-31 NOTE — Telephone Encounter (Signed)
Noted, due to COVID-19

## 2018-05-31 NOTE — Telephone Encounter (Signed)
I spoke to patient and let her know, she is concerned about weight gain, I advised patient that this medication can cause weight gain and she will need to be mindful.

## 2018-06-01 ENCOUNTER — Other Ambulatory Visit: Payer: Self-pay

## 2018-06-01 ENCOUNTER — Ambulatory Visit (INDEPENDENT_AMBULATORY_CARE_PROVIDER_SITE_OTHER): Payer: Medicare HMO | Admitting: Family Medicine

## 2018-06-01 DIAGNOSIS — F419 Anxiety disorder, unspecified: Secondary | ICD-10-CM | POA: Diagnosis not present

## 2018-06-01 DIAGNOSIS — R102 Pelvic and perineal pain unspecified side: Secondary | ICD-10-CM

## 2018-06-01 DIAGNOSIS — N939 Abnormal uterine and vaginal bleeding, unspecified: Secondary | ICD-10-CM

## 2018-06-01 NOTE — Progress Notes (Signed)
Virtual Visit via Telephone Note  I connected with Kristin Braun on 06/01/18 at 10:40 AM EDT by telephone and verified that I am speaking with the correct person using two identifiers.   I discussed the limitations, risks, security and privacy concerns of performing an evaluation and management service by telephone and the availability of in person appointments. I also discussed with the patient that there may be a patient responsible charge related to this service. The patient expressed understanding and agreed to proceed.   History of Present Illness:  Past Medical History:  Diagnosis Date  . Anxiety   . Bipolar 1 disorder ([place])    [facility]- Dr. [name].- sees him q 6-8 weeks  . Chronic pain syndrome   . Epilepsy ([place])    TBI- related to seizures - pt. reports that stress flares the seizure activity   . Family history of colon cancer   . Family history of lung cancer   . Family history of ovarian cancer 12/15/2016  . Fibromyalgia   . GERD (gastroesophageal reflux disease)   . Headache    migraines   . History of hiatal hernia   . History of kidney stones    passed spontaneously  . Hypertension    showing ^ BP, pt. relates to anxiety, reports that she has never had tx for BP or any heart related problems.   . Osteoarthritis    everywhere- - hips & hands   . Osteoporosis   . PTSD (post-traumatic stress disorder)   . Sclerosing adenosis of breast, left   . Seizures ([place])    last seizure 10-12-16, petit mal, Dr Roddie Mc aware  . Sleep apnea   . TBI (traumatic brain injury) ([place])    plate on L side of head   . Thyroid disease     Current Outpatient Medications on File Prior to Visit  Medication Sig Dispense Refill  . ARIPiprazole (ABILIFY) 10 MG tablet Take 1 tablet (10 mg total) by mouth daily for 30 days. 30 tablet 0  . clonazePAM (KLONOPIN) 0.5 MG tablet Take 1 tablet (0.5 mg total) by mouth 3 (three) times daily as needed for anxiety. 90 tablet 0  .  gabapentin (NEURONTIN) 100 MG capsule Take 1 capsule (100 mg total) by mouth 3 (three) times daily. 90 capsule 3  . linaclotide (LINZESS) 72 MCG capsule Take 2 capsules (144 mcg total) by mouth daily before breakfast. 60 capsule 3  . metoCLOPramide (REGLAN) 10 MG tablet Take 1 tablet (10 mg total) by mouth 3 (three) times daily as needed. For nausea, vomiting, headaches or migraines 90 tablet 1  . OXcarbazepine (TRILEPTAL) 150 MG tablet Take 1 tablet (150 mg total) by mouth 2 (two) times daily for 30 days. 60 tablet 0  . pantoprazole (PROTONIX) 40 MG tablet Take 1 tablet (40 mg total) by mouth 2 (two) times daily. 60 tablet 3  . sucralfate (CARAFATE) 1 GM/10ML suspension Take 10 mLs (1 g total) by mouth every 6 (six) hours as needed. 420 mL 1  . sulfamethoxazole-trimethoprim (BACTRIM DS,SEPTRA DS) 800-160 MG tablet Take 1 tablet by mouth 2 (two) times daily. 14 tablet 0  . topiramate (TOPAMAX) 200 MG tablet Take 1 tablet (200 mg total) by mouth 2 (two) times daily. 180 tablet 3  . traZODone (DESYREL) 100 MG tablet Take 1 tablet (100 mg total) by mouth at bedtime as needed for up to 30 days for sleep. 30 tablet 0   No current facility-administered medications on file prior  to visit.     Current Status: Since her last office visit, she has c/o vaginal discomfort, irritation, pain, and bleeding. Most recently had bleeding post sex. Reports that pain is more severe in her left upper quadrant area. She denies urinary frequency, discharge, dysuria, urinary itching, burning, odor, hematuria, and suprapubic pain/discomfort. Her anxiety is moderate today. She denies suicidal ideations, homicidal ideations, or auditory hallucinations. She continues to follow up with Dr. Josephine Igo at [facility].   She denies fevers, chills, fatigue, recent infections, weight loss, and night sweats. She has not had any headaches, visual changes, dizziness, and falls. No chest pain, heart palpitations, cough and  shortness of breath reported. No reports of GI problems such as nausea, vomiting, diarrhea, and constipation. She has no reports of blood in stools.   Observations/Objective:  Telephone Virtual Visit   Assessment and Plan:  1. Vaginal bleeding We will refer her to GYN and place order for Swab today.  - NuSwab Vaginitis Plus (VG+); Future - Ambulatory referral to Obstetrics / Gynecology  2. Pelvic pain Stable today. Not worsening.  - NuSwab Vaginitis Plus (VG+); Future - Ambulatory referral to Obstetrics / Gynecology  3. Anxiety Moderate today. She will continue medications as prescribed by Psychiatrist.   No orders of the defined types were placed in this encounter.   Orders Placed This Encounter  Procedures  . NuSwab Vaginitis Plus (VG+)  . Ambulatory referral to Obstetrics / Gynecology     Referral Orders     Ambulatory referral to Obstetrics / Gynecology   [name],  MSN, FNP-C [facility] [address] [place], [place] [address] [phone]   Follow Up Instructions:  She will keep follow up appointment in 09/2018.   I discussed the assessment and treatment plan with the patient. The patient was provided an opportunity to ask questions and all were answered. The patient agreed with the plan and demonstrated an understanding of the instructions.   The patient was advised to call back or seek an in-person evaluation if the symptoms worsen or if the condition fails to improve as anticipated.  I provided 15-20 minutes of non-face-to-face time during this encounter.   Kristin Glatter, FNP

## 2018-06-04 ENCOUNTER — Other Ambulatory Visit: Payer: Self-pay | Admitting: Family Medicine

## 2018-06-04 DIAGNOSIS — B9689 Other specified bacterial agents as the cause of diseases classified elsewhere: Secondary | ICD-10-CM

## 2018-06-04 DIAGNOSIS — N760 Acute vaginitis: Principal | ICD-10-CM

## 2018-06-04 LAB — NUSWAB VAGINITIS PLUS (VG+)
Atopobium vaginae: HIGH Score — AB
BVAB 2: HIGH Score — AB
Candida albicans, NAA: NEGATIVE
Candida glabrata, NAA: NEGATIVE
Chlamydia trachomatis, NAA: NEGATIVE
Megasphaera 1: HIGH Score — AB
Neisseria gonorrhoeae, NAA: NEGATIVE
Trich vag by NAA: NEGATIVE

## 2018-06-04 MED ORDER — METRONIDAZOLE 500 MG PO TABS: 500.0000 mg | ORAL_TABLET | Freq: Two times a day (BID) | ORAL | 0 refills | Status: AC

## 2018-06-06 ENCOUNTER — Other Ambulatory Visit: Payer: Self-pay | Admitting: Surgery

## 2018-06-06 DIAGNOSIS — R6889 Other general symptoms and signs: Secondary | ICD-10-CM | POA: Diagnosis not present

## 2018-06-06 DIAGNOSIS — N6091 Unspecified benign mammary dysplasia of right breast: Secondary | ICD-10-CM | POA: Diagnosis not present

## 2018-06-06 DIAGNOSIS — K432 Incisional hernia without obstruction or gangrene: Secondary | ICD-10-CM | POA: Diagnosis not present

## 2018-06-06 DIAGNOSIS — N6092 Unspecified benign mammary dysplasia of left breast: Secondary | ICD-10-CM

## 2018-06-08 ENCOUNTER — Telehealth: Payer: Self-pay | Admitting: Neurology

## 2018-06-08 ENCOUNTER — Other Ambulatory Visit: Payer: Self-pay | Admitting: Neurology

## 2018-06-08 DIAGNOSIS — R9401 Abnormal electroencephalogram [EEG]: Secondary | ICD-10-CM

## 2018-06-08 DIAGNOSIS — R569 Unspecified convulsions: Secondary | ICD-10-CM

## 2018-06-08 DIAGNOSIS — G40209 Localization-related (focal) (partial) symptomatic epilepsy and epileptic syndromes with complex partial seizures, not intractable, without status epilepticus: Secondary | ICD-10-CM

## 2018-06-08 DIAGNOSIS — G40A19 Absence epileptic syndrome, intractable, without status epilepticus: Secondary | ICD-10-CM

## 2018-06-08 NOTE — Telephone Encounter (Signed)
Order for referral is placed for the patient and someone will be in contact with her to get her scheduled for this when they are up and running.

## 2018-06-08 NOTE — Telephone Encounter (Signed)
I am all in favor of an EMU evaluation and will refer. The patient will need to bring a confidante and EMU may be closed for a while.CD Please put in referral.

## 2018-06-08 NOTE — Telephone Encounter (Signed)
Patient notified and will wait for the call

## 2018-06-08 NOTE — Telephone Encounter (Signed)
Pt called in wanting to know if she can be set up for the epilepsy monitoring unit in [facility]

## 2018-06-09 DIAGNOSIS — Z79899 Other long term (current) drug therapy: Secondary | ICD-10-CM | POA: Diagnosis not present

## 2018-06-09 DIAGNOSIS — Z131 Encounter for screening for diabetes mellitus: Secondary | ICD-10-CM | POA: Diagnosis not present

## 2018-06-09 DIAGNOSIS — R0602 Shortness of breath: Secondary | ICD-10-CM | POA: Diagnosis not present

## 2018-06-09 DIAGNOSIS — Z0000 Encounter for general adult medical examination without abnormal findings: Secondary | ICD-10-CM | POA: Diagnosis not present

## 2018-06-09 DIAGNOSIS — R5383 Other fatigue: Secondary | ICD-10-CM | POA: Diagnosis not present

## 2018-06-09 DIAGNOSIS — R635 Abnormal weight gain: Secondary | ICD-10-CM | POA: Diagnosis not present

## 2018-06-13 ENCOUNTER — Ambulatory Visit (HOSPITAL_COMMUNITY): Payer: Self-pay | Admitting: Psychiatry

## 2018-06-14 ENCOUNTER — Other Ambulatory Visit: Payer: Self-pay | Admitting: Family Medicine

## 2018-06-14 ENCOUNTER — Ambulatory Visit (HOSPITAL_COMMUNITY): Payer: Medicare HMO

## 2018-06-14 DIAGNOSIS — B9689 Other specified bacterial agents as the cause of diseases classified elsewhere: Secondary | ICD-10-CM

## 2018-06-14 DIAGNOSIS — Z79899 Other long term (current) drug therapy: Secondary | ICD-10-CM | POA: Diagnosis not present

## 2018-06-14 DIAGNOSIS — N760 Acute vaginitis: Principal | ICD-10-CM

## 2018-06-14 DIAGNOSIS — G894 Chronic pain syndrome: Secondary | ICD-10-CM | POA: Diagnosis not present

## 2018-06-14 DIAGNOSIS — M797 Fibromyalgia: Secondary | ICD-10-CM | POA: Diagnosis not present

## 2018-06-16 ENCOUNTER — Other Ambulatory Visit: Payer: Self-pay

## 2018-06-16 ENCOUNTER — Ambulatory Visit (INDEPENDENT_AMBULATORY_CARE_PROVIDER_SITE_OTHER): Payer: Medicare HMO | Admitting: Psychiatry

## 2018-06-16 ENCOUNTER — Other Ambulatory Visit: Payer: Self-pay | Admitting: Gastroenterology

## 2018-06-16 DIAGNOSIS — G4700 Insomnia, unspecified: Secondary | ICD-10-CM | POA: Diagnosis not present

## 2018-06-16 DIAGNOSIS — F4312 Post-traumatic stress disorder, chronic: Secondary | ICD-10-CM

## 2018-06-16 DIAGNOSIS — F3162 Bipolar disorder, current episode mixed, moderate: Secondary | ICD-10-CM | POA: Diagnosis not present

## 2018-06-16 MED ORDER — OXCARBAZEPINE 150 MG PO TABS: 150.0000 mg | ORAL_TABLET | Freq: Two times a day (BID) | ORAL | 1 refills | Status: DC

## 2018-06-16 MED ORDER — CLONAZEPAM 0.5 MG PO TABS: 0.5000 mg | ORAL_TABLET | Freq: Three times a day (TID) | ORAL | 1 refills | Status: DC | PRN

## 2018-06-16 MED ORDER — TRAZODONE HCL 150 MG PO TABS: 150.0000 mg | ORAL_TABLET | Freq: Every evening | ORAL | 1 refills | Status: DC | PRN

## 2018-06-16 MED ORDER — ARIPIPRAZOLE 15 MG PO TABS: 15.0000 mg | ORAL_TABLET | Freq: Every day | ORAL | 1 refills | Status: DC
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
## 2018-06-17 ENCOUNTER — Other Ambulatory Visit: Payer: Self-pay | Admitting: Surgery

## 2018-06-17 DIAGNOSIS — N6092 Unspecified benign mammary dysplasia of left breast: Secondary | ICD-10-CM

## 2018-06-21 ENCOUNTER — Ambulatory Visit: Payer: Self-pay | Admitting: Neurology

## 2018-06-22 ENCOUNTER — Ambulatory Visit: Payer: Medicare HMO | Admitting: Physical Therapy

## 2018-06-23 ENCOUNTER — Other Ambulatory Visit: Payer: Self-pay

## 2018-06-23 ENCOUNTER — Ambulatory Visit (INDEPENDENT_AMBULATORY_CARE_PROVIDER_SITE_OTHER): Payer: Medicare HMO | Admitting: Obstetrics and Gynecology

## 2018-06-23 ENCOUNTER — Encounter: Payer: Self-pay | Admitting: Obstetrics and Gynecology

## 2018-06-23 VITALS — BP 121/86

## 2018-06-23 DIAGNOSIS — R102 Pelvic and perineal pain: Secondary | ICD-10-CM | POA: Diagnosis not present

## 2018-06-23 DIAGNOSIS — N938 Other specified abnormal uterine and vaginal bleeding: Secondary | ICD-10-CM | POA: Diagnosis not present

## 2018-06-23 MED ORDER — MEGESTROL ACETATE 40 MG PO TABS: 40.0000 mg | ORAL_TABLET | Freq: Two times a day (BID) | ORAL | 5 refills | Status: DC
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
## 2018-06-27 ENCOUNTER — Other Ambulatory Visit (HOSPITAL_COMMUNITY): Payer: Self-pay | Admitting: Psychiatry

## 2018-06-27 ENCOUNTER — Ambulatory Visit: Payer: Medicare HMO

## 2018-07-08 DIAGNOSIS — R635 Abnormal weight gain: Secondary | ICD-10-CM | POA: Diagnosis not present

## 2018-07-08 DIAGNOSIS — R6889 Other general symptoms and signs: Secondary | ICD-10-CM | POA: Diagnosis not present

## 2018-07-18 ENCOUNTER — Ambulatory Visit (HOSPITAL_COMMUNITY)
Admission: RE | Admit: 2018-07-18 | Discharge: 2018-07-18 | Disposition: A | Discharge status: Home / Self Care | Payer: Medicare HMO | Source: Ambulatory Visit | Attending: Gastroenterology | Admitting: Gastroenterology

## 2018-07-18 ENCOUNTER — Other Ambulatory Visit: Payer: Self-pay

## 2018-07-18 DIAGNOSIS — R1013 Epigastric pain: Secondary | ICD-10-CM | POA: Diagnosis not present

## 2018-07-18 DIAGNOSIS — K449 Diaphragmatic hernia without obstruction or gangrene: Secondary | ICD-10-CM | POA: Diagnosis not present

## 2018-07-18 DIAGNOSIS — R6889 Other general symptoms and signs: Secondary | ICD-10-CM | POA: Diagnosis not present

## 2018-07-18 DIAGNOSIS — K219 Gastro-esophageal reflux disease without esophagitis: Secondary | ICD-10-CM | POA: Insufficient documentation

## 2018-07-18 DIAGNOSIS — R748 Abnormal levels of other serum enzymes: Secondary | ICD-10-CM

## 2018-07-18 NOTE — Progress Notes (Signed)
[facility], [place] [address] Phone: [phone] Fax: [facility] [address] [place] [address] Phone: [phone] Fax: [phone]      Your procedure is scheduled on June 3  Report to [facility] Main Entrance "A" at 1200 P.M., and check in at the Admitting office.  Call this number if you have problems the morning of surgery:  [phone]  Call [phone] if you have any questions prior to your surgery date Monday-Friday 8am-4pm    Remember:  Do not eat after midnight.  You may drink clear liquids until 1100 am  Clear liquids allowed are: Water, Non-Citrus Juices (without pulp), Carbonated Beverages, Clear Tea, Black Coffee Only, and Gatorade    Take these medicines the morning of surgery with A SIP OF WATER  clonazePAM (KLONOPIN)   linaclotide (LINZESS) metoCLOPramide (REGLAN)  If needed OXcarbazepine (TRILEPTAL) pantoprazole (PROTONIX) topiramate (TOPAMAX)   Stop Taking Phentermine TODAY!  7 days prior to surgery STOP taking any Aspirin (unless otherwise instructed by your surgeon), Aleve, Naproxen, Ibuprofen, Motrin, Advil, Goody's, BC's, all herbal medications, fish oil, and all vitamins.    The Morning of Surgery  Do not wear jewelry, make-up or nail polish.  Do not wear lotions, powders, or perfumes/colognes, or deodorant  Do not shave 48 hours prior to surgery.   Do not bring valuables to the hospital.  [facility] is not responsible for any belongings or valuables.  If you are a smoker, DO NOT Smoke 24 hours prior to surgery IF you wear a CPAP at night please bring your mask, tubing, and machine the morning of surgery   Remember that you must have someone to transport you home after your surgery, and remain with you for 24 hours if you are discharged the same  day.   Contacts, glasses, hearing aids, dentures or bridgework may not be worn into surgery.    Leave your suitcase in the car.  After surgery it may be brought to your room.  For patients admitted to the hospital, discharge time will be determined by your treatment team.  Patients discharged the day of surgery will not be allowed to drive home.    Special instructions:   [place]- Preparing For Surgery  Before surgery, you can play an important role. Because skin is not sterile, your skin needs to be as free of germs as possible. You can reduce the number of germs on your skin by washing with CHG (chlorahexidine gluconate) Soap before surgery.  CHG is an antiseptic cleaner which kills germs and bonds with the skin to continue killing germs even after washing.    Oral Hygiene is also important to reduce your risk of infection.  Remember - BRUSH YOUR TEETH THE MORNING OF SURGERY WITH YOUR REGULAR TOOTHPASTE  Please do not use if you have an allergy to CHG or antibacterial soaps. If your skin becomes reddened/irritated stop using the CHG.  Do not shave (including legs and underarms) for at least 48 hours prior to first CHG shower. It is OK to shave your face.  Please follow these instructions carefully.   1. Shower the NIGHT BEFORE SURGERY and the MORNING OF SURGERY with CHG Soap.   2. If you chose to wash your hair, wash your hair first as usual with your normal shampoo.  3. After you shampoo,  rinse your hair and body thoroughly to remove the shampoo.  4. Use CHG as you would any other liquid soap. You can apply CHG directly to the skin and wash gently with a scrungie or a clean washcloth.   5. Apply the CHG Soap to your body ONLY FROM THE NECK DOWN.  Do not use on open wounds or open sores. Avoid contact with your eyes, ears, mouth and genitals (private parts). Wash Face and genitals (private parts)  with your normal soap.   6. Wash thoroughly, paying special attention to the  area where your surgery will be performed.  7. Thoroughly rinse your body with warm water from the neck down.  8. DO NOT shower/wash with your normal soap after using and rinsing off the CHG Soap.  9. Pat yourself dry with a CLEAN TOWEL.  10. Wear CLEAN PAJAMAS to bed the night before surgery, wear comfortable clothes the morning of surgery  11. Place CLEAN SHEETS on your bed the night of your first shower and DO NOT SLEEP WITH PETS.    Day of Surgery:  Do not apply any deodorants/lotions.  Please wear clean clothes to the hospital/surgery center.   Remember to brush your teeth WITH YOUR REGULAR TOOTHPASTE.   Please read over the following fact sheets that you were given.

## 2018-07-19 ENCOUNTER — Ambulatory Visit
Admission: RE | Admit: 2018-07-19 | Discharge: 2018-07-19 | Disposition: A | Discharge status: Home / Self Care | Payer: Medicare HMO | Source: Ambulatory Visit | Attending: Surgery | Admitting: Surgery

## 2018-07-19 ENCOUNTER — Other Ambulatory Visit: Payer: Self-pay

## 2018-07-19 ENCOUNTER — Encounter (HOSPITAL_COMMUNITY)
Admission: RE | Admit: 2018-07-19 | Discharge: 2018-07-19 | Disposition: A | Discharge status: Home / Self Care | Payer: Medicare HMO | Source: Ambulatory Visit | Attending: Surgery | Admitting: Surgery

## 2018-07-19 ENCOUNTER — Encounter (HOSPITAL_COMMUNITY): Payer: Self-pay

## 2018-07-19 ENCOUNTER — Other Ambulatory Visit (HOSPITAL_COMMUNITY)
Admission: RE | Admit: 2018-07-19 | Discharge: 2018-07-19 | Disposition: A | Discharge status: Home / Self Care | Payer: Medicare HMO | Source: Ambulatory Visit | Attending: Surgery | Admitting: Surgery

## 2018-07-19 ENCOUNTER — Other Ambulatory Visit (HOSPITAL_COMMUNITY): Payer: Medicare HMO

## 2018-07-19 DIAGNOSIS — F419 Anxiety disorder, unspecified: Secondary | ICD-10-CM | POA: Diagnosis not present

## 2018-07-19 DIAGNOSIS — K430 Incisional hernia with obstruction, without gangrene: Secondary | ICD-10-CM | POA: Diagnosis not present

## 2018-07-19 DIAGNOSIS — K219 Gastro-esophageal reflux disease without esophagitis: Secondary | ICD-10-CM | POA: Diagnosis not present

## 2018-07-19 DIAGNOSIS — N6092 Unspecified benign mammary dysplasia of left breast: Secondary | ICD-10-CM

## 2018-07-19 DIAGNOSIS — Z8041 Family history of malignant neoplasm of ovary: Secondary | ICD-10-CM | POA: Diagnosis not present

## 2018-07-19 DIAGNOSIS — G40A09 Absence epileptic syndrome, not intractable, without status epilepticus: Secondary | ICD-10-CM | POA: Diagnosis not present

## 2018-07-19 DIAGNOSIS — Z1159 Encounter for screening for other viral diseases: Secondary | ICD-10-CM | POA: Diagnosis not present

## 2018-07-19 DIAGNOSIS — Z803 Family history of malignant neoplasm of breast: Secondary | ICD-10-CM | POA: Diagnosis not present

## 2018-07-19 DIAGNOSIS — Z79899 Other long term (current) drug therapy: Secondary | ICD-10-CM | POA: Diagnosis not present

## 2018-07-19 DIAGNOSIS — I10 Essential (primary) hypertension: Secondary | ICD-10-CM | POA: Diagnosis not present

## 2018-07-19 DIAGNOSIS — N6091 Unspecified benign mammary dysplasia of right breast: Secondary | ICD-10-CM | POA: Diagnosis not present

## 2018-07-19 LAB — BASIC METABOLIC PANEL
Anion gap: 7 (ref 5–15)
BUN: 16 mg/dL (ref 6–20)
CO2: 18 mmol/L — ABNORMAL LOW (ref 22–32)
Calcium: 8.9 mg/dL (ref 8.9–10.3)
Chloride: 114 mmol/L — ABNORMAL HIGH (ref 98–111)
Creatinine, Ser: 0.9 mg/dL (ref 0.44–1.00)
GFR calc Af Amer: >60 mL/min (ref >60)
GFR calc non Af Amer: >60 mL/min (ref >60)
Glucose, Bld: 79 mg/dL (ref 70–99)
Potassium: 3.9 mmol/L (ref 3.5–5.1)
Sodium: 139 mmol/L (ref 135–145)

## 2018-07-19 LAB — CBC
HCT: 43.8 % (ref 36.0–46.0)
Hemoglobin: 14.2 g/dL (ref 12.0–15.0)
MCH: 30.1 pg (ref 26.0–34.0)
MCHC: 32.4 g/dL (ref 30.0–36.0)
MCV: 92.8 fL (ref 80.0–100.0)
Platelets: 183 10*3/uL (ref 150–400)
RBC: 4.72 MIL/uL (ref 3.87–5.11)
RDW: 13.9 % (ref 11.5–15.5)
WBC: 7 10*3/uL (ref 4.0–10.5)
nRBC: 0 % (ref 0.0–0.2)

## 2018-07-19 LAB — SARS CORONAVIRUS 2 BY RT PCR (HOSPITAL ORDER, PERFORMED IN ~~LOC~~ HOSPITAL LAB): SARS Coronavirus 2: NEGATIVE

## 2018-07-19 LAB — POCT PREGNANCY, URINE: Preg Test, Ur: NEGATIVE

## 2018-07-19 NOTE — Progress Notes (Signed)
PCP - Sandi Mariscal Cardiologist - denies  Chest x-ray - not needed  EKG - requesting Stress Test - 08/03/16 CE ECHO - 02/20/16 CE requesting Cardiac Cath - denies   Anesthesia review: yes, Records Requested  Patient denies shortness of breath, fever, cough and chest pain at PAT appointment   Patient verbalized understanding of instructions that were given to them at the PAT appointment. Patient was also instructed that they will need to review over the PAT instructions again at home before surgery.

## 2018-07-19 NOTE — H&P (Signed)
  Kristin Braun  Location: [facility] Surgery Patient #: [id number] DOB: Apr 10, 1975 Single / Language: [name] / Race: White Female   History of Present Illness   The patient is a 43 year old female who presents with a complaint of Breast problems. She is here for a follow-up regarding her bilateral breast MRIs and subsequent biopsy. Again, I have performed a left breast biopsy on her for nipple discharge and a complex sclerosing lesion. Because of her strong family history of both ovarian and breast cancer, an MRI of her breasts was ordered after she developed right breast nipple discharge. This showed 2 abnormal areas in the right breast which were both biopsied. One area was benign and the other showed atypical lobular hyperplasia. Since the MRI, she's been having abdominal pain and she underwent a CT scan of her abdomen and pelvis which showed a small incisional hernia containing omentum above the umbilicus. She reports moderate pain at the site of the hernia she describes sharp. She has had no problems regarding the breast biopsy.   Allergies Amoxicillin *PENICILLINS*  Morphine Sulfate (PF) *ANALGESICS - OPIOID*   Medication History  clonazePAM (0.5MG  Tablet, Oral) Active. ARIPiprazole (10MG  Tablet, Oral) Active. Linzess (72MCG Capsule, Oral) Active. traZODone HCl (100MG  Tablet, Oral) Active. Topiramate (200MG  Tablet, Oral) Active. Sucralfate (1GM/10ML Suspension, Oral) Active. Pantoprazole Sodium (40MG  Tablet DR, Oral) Active. OXcarbazepine (150MG  Tablet, Oral) Active. metroNIDAZOLE (500MG  Tablet, Oral) Active. Famotidine (20MG  Tablet, Oral) Active. Gabapentin (100MG  Capsule, Oral) Active. Metoclopramide HCl (10MG  Tablet, Oral) Active. Medications Reconciled  Vitals  Weight: 187.6 lb Height: 64in Body Surface Area: 1.9 m Body Mass Index: 32.2 kg/m  Temp.: 98.64F(Oral)  Pulse: 97 (Regular)  BP: 116/78 (Sitting, Left Arm,  Standard)   Physical Exam  The physical exam findings are as follows: Note:On exam, the right breast incision is well healed without evidence of hematoma from stereotactic biopsy.  There is no palpable mass On examination of her abdomen, there is a chronically incarcerated small incisional hernia above the umbilicus containing omentum. Generally well in appearance Lungs clear CV RRR Ext without edema  I reviewed her pathology from breast biopsy and gave her a copy. 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 I discussed this with her in detail. She has had previous biopsies in the past. We discussed the risks of bleeding, infection, and the need for further surgery should malignancy be found. As her hernia is symptomatic, we would also proceed with a small incisional hernia repair with mesh at the same time. I discussed the risks of surgery with her as well including postoperative recovery. Surgery may be delayed until June secondary to the coronal virus. She understands and wished to proceed with surgery when possible
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
## 2018-07-20 ENCOUNTER — Ambulatory Visit (HOSPITAL_COMMUNITY)
Admission: RE | Admit: 2018-07-20 | Discharge: 2018-07-20 | Disposition: A | Discharge status: Home / Self Care | Payer: Medicare HMO | Attending: Surgery | Admitting: Surgery

## 2018-07-20 ENCOUNTER — Other Ambulatory Visit: Payer: Self-pay

## 2018-07-20 ENCOUNTER — Ambulatory Visit (HOSPITAL_COMMUNITY): Payer: Medicare HMO | Admitting: Anesthesiology

## 2018-07-20 ENCOUNTER — Ambulatory Visit (HOSPITAL_COMMUNITY): Payer: Medicare HMO | Admitting: Physician Assistant

## 2018-07-20 ENCOUNTER — Ambulatory Visit
Admission: RE | Admit: 2018-07-20 | Discharge: 2018-07-20 | Disposition: A | Discharge status: Home / Self Care | Payer: Medicare HMO | Source: Ambulatory Visit | Attending: Surgery | Admitting: Surgery

## 2018-07-20 ENCOUNTER — Encounter (HOSPITAL_COMMUNITY): Payer: Self-pay

## 2018-07-20 ENCOUNTER — Encounter (HOSPITAL_COMMUNITY)
Admission: RE | Disposition: A | Discharge status: Home / Self Care | Payer: Self-pay | Source: Home / Self Care | Attending: Surgery

## 2018-07-20 DIAGNOSIS — K430 Incisional hernia with obstruction, without gangrene: Secondary | ICD-10-CM | POA: Insufficient documentation

## 2018-07-20 DIAGNOSIS — G40A09 Absence epileptic syndrome, not intractable, without status epilepticus: Secondary | ICD-10-CM | POA: Insufficient documentation

## 2018-07-20 DIAGNOSIS — N6011 Diffuse cystic mastopathy of right breast: Secondary | ICD-10-CM | POA: Diagnosis not present

## 2018-07-20 DIAGNOSIS — K2900 Acute gastritis without bleeding: Secondary | ICD-10-CM | POA: Diagnosis not present

## 2018-07-20 DIAGNOSIS — N6092 Unspecified benign mammary dysplasia of left breast: Secondary | ICD-10-CM

## 2018-07-20 DIAGNOSIS — N6081 Other benign mammary dysplasias of right breast: Secondary | ICD-10-CM | POA: Diagnosis not present

## 2018-07-20 DIAGNOSIS — Z803 Family history of malignant neoplasm of breast: Secondary | ICD-10-CM | POA: Diagnosis not present

## 2018-07-20 DIAGNOSIS — Z1159 Encounter for screening for other viral diseases: Secondary | ICD-10-CM | POA: Diagnosis not present

## 2018-07-20 DIAGNOSIS — N6091 Unspecified benign mammary dysplasia of right breast: Secondary | ICD-10-CM | POA: Insufficient documentation

## 2018-07-20 DIAGNOSIS — K219 Gastro-esophageal reflux disease without esophagitis: Secondary | ICD-10-CM | POA: Diagnosis not present

## 2018-07-20 DIAGNOSIS — Z8041 Family history of malignant neoplasm of ovary: Secondary | ICD-10-CM | POA: Insufficient documentation

## 2018-07-20 DIAGNOSIS — F419 Anxiety disorder, unspecified: Secondary | ICD-10-CM | POA: Diagnosis not present

## 2018-07-20 DIAGNOSIS — K432 Incisional hernia without obstruction or gangrene: Secondary | ICD-10-CM | POA: Diagnosis not present

## 2018-07-20 DIAGNOSIS — I10 Essential (primary) hypertension: Secondary | ICD-10-CM | POA: Insufficient documentation

## 2018-07-20 DIAGNOSIS — Z79899 Other long term (current) drug therapy: Secondary | ICD-10-CM | POA: Insufficient documentation

## 2018-07-20 HISTORY — PX: BREAST EXCISIONAL BIOPSY: SUR124

## 2018-07-20 HISTORY — PX: INCISIONAL HERNIA REPAIR: SHX193

## 2018-07-20 HISTORY — PX: BREAST LUMPECTOMY WITH RADIOACTIVE SEED LOCALIZATION: SHX6424

## 2018-07-20 SURGERY — BREAST LUMPECTOMY WITH RADIOACTIVE SEED LOCALIZATION
Anesthesia: General | Site: Breast | Laterality: Right

## 2018-07-20 MED ORDER — ROCURONIUM BROMIDE 10 MG/ML (PF) SYRINGE
PREFILLED_SYRINGE | INTRAVENOUS | Status: DC | PRN
  Administered 2018-07-20: 60 mg via INTRAVENOUS

## 2018-07-20 MED ORDER — CHLORHEXIDINE GLUCONATE CLOTH 2 % EX PADS: 6.0000 | MEDICATED_PAD | Freq: Once | CUTANEOUS | Status: DC

## 2018-07-20 MED ORDER — ROCURONIUM BROMIDE 10 MG/ML (PF) SYRINGE
PREFILLED_SYRINGE | INTRAVENOUS | Status: AC
  Filled 2018-07-20: qty 10

## 2018-07-20 MED ORDER — FENTANYL CITRATE (PF) 100 MCG/2ML IJ SOLN: 25.0000 ug | INTRAMUSCULAR | Status: DC | PRN

## 2018-07-20 MED ORDER — LACTATED RINGERS IV SOLN
INTRAVENOUS | Status: DC
  Administered 2018-07-20 (×2): via INTRAVENOUS

## 2018-07-20 MED ORDER — DEXAMETHASONE SODIUM PHOSPHATE 10 MG/ML IJ SOLN
INTRAMUSCULAR | Status: AC
  Filled 2018-07-20: qty 1

## 2018-07-20 MED ORDER — CELECOXIB 200 MG PO CAPS
200.0000 mg | ORAL_CAPSULE | ORAL | Status: AC
  Administered 2018-07-20: 12:00:00 200 mg via ORAL
  Filled 2018-07-20: qty 1

## 2018-07-20 MED ORDER — ACETAMINOPHEN 500 MG PO TABS
1000.0000 mg | ORAL_TABLET | ORAL | Status: AC
  Administered 2018-07-20: 12:00:00 1000 mg via ORAL
  Filled 2018-07-20: qty 2

## 2018-07-20 MED ORDER — LIDOCAINE 2% (20 MG/ML) 5 ML SYRINGE
INTRAMUSCULAR | Status: AC
  Filled 2018-07-20: qty 5

## 2018-07-20 MED ORDER — PROPOFOL 10 MG/ML IV BOLUS
INTRAVENOUS | Status: DC | PRN
  Administered 2018-07-20: 200 mg via INTRAVENOUS

## 2018-07-20 MED ORDER — DEXAMETHASONE SODIUM PHOSPHATE 10 MG/ML IJ SOLN
INTRAMUSCULAR | Status: DC | PRN
  Administered 2018-07-20: 8 mg via INTRAVENOUS

## 2018-07-20 MED ORDER — CIPROFLOXACIN IN D5W 400 MG/200ML IV SOLN
400.0000 mg | INTRAVENOUS | Status: AC
  Administered 2018-07-20: 400 mg via INTRAVENOUS
  Filled 2018-07-20: qty 200

## 2018-07-20 MED ORDER — DEXAMETHASONE SODIUM PHOSPHATE 10 MG/ML IJ SOLN
INTRAMUSCULAR | Status: AC
  Filled 2018-07-20: qty 2

## 2018-07-20 MED ORDER — FENTANYL CITRATE (PF) 250 MCG/5ML IJ SOLN
INTRAMUSCULAR | Status: AC
  Filled 2018-07-20: qty 5

## 2018-07-20 MED ORDER — SUGAMMADEX SODIUM 200 MG/2ML IV SOLN
INTRAVENOUS | Status: DC | PRN
  Administered 2018-07-20: 200 mg via INTRAVENOUS

## 2018-07-20 MED ORDER — FENTANYL CITRATE (PF) 250 MCG/5ML IJ SOLN
INTRAMUSCULAR | Status: DC | PRN
  Administered 2018-07-20: 100 ug via INTRAVENOUS
  Administered 2018-07-20: 150 ug via INTRAVENOUS

## 2018-07-20 MED ORDER — MIDAZOLAM HCL 2 MG/2ML IJ SOLN
INTRAMUSCULAR | Status: AC
  Filled 2018-07-20: qty 2

## 2018-07-20 MED ORDER — ONDANSETRON HCL 4 MG/2ML IJ SOLN
INTRAMUSCULAR | Status: DC | PRN
  Administered 2018-07-20: 4 mg via INTRAVENOUS

## 2018-07-20 MED ORDER — SUCCINYLCHOLINE CHLORIDE 200 MG/10ML IV SOSY
PREFILLED_SYRINGE | INTRAVENOUS | Status: AC
  Filled 2018-07-20: qty 10

## 2018-07-20 MED ORDER — 0.9 % SODIUM CHLORIDE (POUR BTL) OPTIME
TOPICAL | Status: DC | PRN
  Administered 2018-07-20: 1000 mL

## 2018-07-20 MED ORDER — OXYCODONE HCL 5 MG PO TABS
ORAL_TABLET | ORAL | Status: AC
  Filled 2018-07-20: qty 1

## 2018-07-20 MED ORDER — ONDANSETRON HCL 4 MG/2ML IJ SOLN: 4.0000 mg | Freq: Once | INTRAMUSCULAR | Status: DC | PRN

## 2018-07-20 MED ORDER — PROPOFOL 10 MG/ML IV BOLUS
INTRAVENOUS | Status: AC
  Filled 2018-07-20: qty 20

## 2018-07-20 MED ORDER — PHENYLEPHRINE 40 MCG/ML (10ML) SYRINGE FOR IV PUSH (FOR BLOOD PRESSURE SUPPORT)
PREFILLED_SYRINGE | INTRAVENOUS | Status: AC
  Filled 2018-07-20: qty 20

## 2018-07-20 MED ORDER — BUPIVACAINE-EPINEPHRINE (PF) 0.25% -1:200000 IJ SOLN
INTRAMUSCULAR | Status: AC
  Filled 2018-07-20: qty 30

## 2018-07-20 MED ORDER — BUPIVACAINE-EPINEPHRINE 0.25% -1:200000 IJ SOLN
INTRAMUSCULAR | Status: DC | PRN
  Administered 2018-07-20: 17 mL
  Administered 2018-07-20: 10 mL

## 2018-07-20 MED ORDER — GABAPENTIN 300 MG PO CAPS
300.0000 mg | ORAL_CAPSULE | ORAL | Status: AC
  Administered 2018-07-20: 300 mg via ORAL
  Filled 2018-07-20: qty 1

## 2018-07-20 MED ORDER — LIDOCAINE 2% (20 MG/ML) 5 ML SYRINGE
INTRAMUSCULAR | Status: DC | PRN
  Administered 2018-07-20: 60 mg via INTRAVENOUS

## 2018-07-20 MED ORDER — OXYCODONE HCL 5 MG PO TABS
5.0000 mg | ORAL_TABLET | Freq: Once | ORAL | Status: AC | PRN
  Administered 2018-07-20: 5 mg via ORAL

## 2018-07-20 MED ORDER — ONDANSETRON HCL 4 MG/2ML IJ SOLN
INTRAMUSCULAR | Status: AC
  Filled 2018-07-20: qty 2

## 2018-07-20 MED ORDER — OXYCODONE HCL 5 MG PO TABS: 5.0000 mg | ORAL_TABLET | Freq: Four times a day (QID) | ORAL | 0 refills | Status: DC | PRN

## 2018-07-20 MED ORDER — ONDANSETRON HCL 4 MG/2ML IJ SOLN
INTRAMUSCULAR | Status: AC
  Filled 2018-07-20: qty 6

## 2018-07-20 MED ORDER — MIDAZOLAM HCL 5 MG/5ML IJ SOLN
INTRAMUSCULAR | Status: DC | PRN
  Administered 2018-07-20: 2 mg via INTRAVENOUS

## 2018-07-20 MED ORDER — OXYCODONE HCL 5 MG/5ML PO SOLN: 5.0000 mg | Freq: Once | ORAL | Status: AC | PRN

## 2018-07-20 SURGICAL SUPPLY — 48 items
ADH SKN CLS APL DERMABOND .7 (GAUZE/BANDAGES/DRESSINGS) ×2
APPLIER CLIP 9.375 MED OPEN (MISCELLANEOUS) ×4
APR CLP MED 9.3 20 MLT OPN (MISCELLANEOUS) ×2
BINDER BREAST LRG (GAUZE/BANDAGES/DRESSINGS) IMPLANT
BINDER BREAST XLRG (GAUZE/BANDAGES/DRESSINGS) IMPLANT
BLADE CLIPPER SURG (BLADE) IMPLANT
CANISTER SUCT 3000ML PPV (MISCELLANEOUS) ×4 IMPLANT
CHLORAPREP W/TINT 26ML (MISCELLANEOUS) ×4 IMPLANT
CLIP APPLIE 9.375 MED OPEN (MISCELLANEOUS) ×2 IMPLANT
COVER PROBE W GEL 5X96 (DRAPES) ×4 IMPLANT
COVER SURGICAL LIGHT HANDLE (MISCELLANEOUS) ×4 IMPLANT
COVER WAND RF STERILE (DRAPES) ×4 IMPLANT
DERMABOND ADVANCED (GAUZE/BANDAGES/DRESSINGS) ×2
DERMABOND ADVANCED .7 DNX12 (GAUZE/BANDAGES/DRESSINGS) ×2 IMPLANT
DEVICE DUBIN SPECIMEN MAMMOGRA (MISCELLANEOUS) ×4 IMPLANT
DRAPE CHEST BREAST 15X10 FENES (DRAPES) ×4 IMPLANT
DRAPE LAPAROSCOPIC ABDOMINAL (DRAPES) ×4 IMPLANT
DRSG TEGADERM 4X4.75 (GAUZE/BANDAGES/DRESSINGS) IMPLANT
ELECT CAUTERY BLADE 6.4 (BLADE) ×4 IMPLANT
ELECT REM PT RETURN 9FT ADLT (ELECTROSURGICAL) ×4
ELECTRODE REM PT RTRN 9FT ADLT (ELECTROSURGICAL) ×2 IMPLANT
GAUZE SPONGE 4X4 12PLY STRL (GAUZE/BANDAGES/DRESSINGS) IMPLANT
GLOVE SURG SIGNA 7.5 PF LTX (GLOVE) ×4 IMPLANT
GOWN STRL REUS W/ TWL LRG LVL3 (GOWN DISPOSABLE) ×2 IMPLANT
GOWN STRL REUS W/ TWL XL LVL3 (GOWN DISPOSABLE) ×2 IMPLANT
GOWN STRL REUS W/TWL LRG LVL3 (GOWN DISPOSABLE) ×4
GOWN STRL REUS W/TWL XL LVL3 (GOWN DISPOSABLE) ×4
KIT BASIN OR (CUSTOM PROCEDURE TRAY) ×4 IMPLANT
KIT MARKER MARGIN INK (KITS) ×4 IMPLANT
KIT TURNOVER KIT B (KITS) ×4 IMPLANT
MESH VENTRALEX ST 1-7/10 CRC S (Mesh General) ×2 IMPLANT
NDL HYPO 25GX1X1/2 BEV (NEEDLE) ×2 IMPLANT
NEEDLE HYPO 25GX1X1/2 BEV (NEEDLE) ×4 IMPLANT
NS IRRIG 1000ML POUR BTL (IV SOLUTION) ×4 IMPLANT
PACK GENERAL/GYN (CUSTOM PROCEDURE TRAY) ×4 IMPLANT
PAD ARMBOARD 7.5X6 YLW CONV (MISCELLANEOUS) ×4 IMPLANT
PENCIL SMOKE EVACUATOR (MISCELLANEOUS) ×4 IMPLANT
STAPLER VISISTAT 35W (STAPLE) IMPLANT
SUT MNCRL AB 4-0 PS2 18 (SUTURE) ×4 IMPLANT
SUT NOVA NAB DX-16 0-1 5-0 T12 (SUTURE) ×2 IMPLANT
SUT PROLENE 1 CT (SUTURE) IMPLANT
SUT VIC AB 3-0 SH 18 (SUTURE) ×4 IMPLANT
SUT VIC AB 3-0 SH 27 (SUTURE)
SUT VIC AB 3-0 SH 27XBRD (SUTURE) IMPLANT
SYR CONTROL 10ML LL (SYRINGE) ×4 IMPLANT
TOWEL OR 17X24 6PK STRL BLUE (TOWEL DISPOSABLE) ×4 IMPLANT
TOWEL OR 17X26 10 PK STRL BLUE (TOWEL DISPOSABLE) ×4 IMPLANT
TRAY FOLEY MTR SLVR 14FR STAT (SET/KITS/TRAYS/PACK) IMPLANT

## 2018-07-20 NOTE — Discharge Instructions (Signed)
[place] Office Phone Number [phone]  BREAST BIOPSY/ PARTIAL MASTECTOMY: POST OP INSTRUCTIONS  Always review your discharge instruction sheet given to you by the facility where your surgery was performed.  IF YOU HAVE DISABILITY OR FAMILY LEAVE FORMS, YOU MUST BRING THEM TO THE OFFICE FOR PROCESSING.  DO NOT GIVE THEM TO YOUR DOCTOR.  1. A prescription for pain medication may be given to you upon discharge.  Take your pain medication as prescribed, if needed.  If narcotic pain medicine is not needed, then you may take acetaminophen (Tylenol) or ibuprofen (Advil) as needed. 2. Take your usually prescribed medications unless otherwise directed 3. If you need a refill on your pain medication, please contact your pharmacy.  They will contact our office to request authorization.  Prescriptions will not be filled after 5pm or on week-ends. 4. You should eat very light the first 24 hours after surgery, such as soup, crackers, pudding, etc.  Resume your normal diet the day after surgery. 5. Most patients will experience some swelling and bruising in the breast.  Ice packs and a good support bra will help.  Swelling and bruising can take several days to resolve.  6. It is common to experience some constipation if taking pain medication after surgery.  Increasing fluid intake and taking a stool softener will usually help or prevent this problem from occurring.  A mild laxative (Milk of Magnesia or Miralax) should be taken according to package directions if there are no bowel movements after 48 hours. 7. Unless discharge instructions indicate otherwise, you may remove your bandages 24-48 hours after surgery, and you may shower at that time.  You may have steri-strips (small skin tapes) in place directly over the incision.  These strips should be left on the skin for 7-10 days.  If your surgeon used skin glue on the incision, you may shower in 24 hours.  The glue will flake off over the  next 2-3 weeks.  Any sutures or staples will be removed at the office during your follow-up visit. 8. ACTIVITIES:  You may resume regular daily activities (gradually increasing) beginning the next day.  Wearing a good support bra or sports bra minimizes pain and swelling.  You may have sexual intercourse when it is comfortable. a. You may drive when you no longer are taking prescription pain medication, you can comfortably wear a seatbelt, and you can safely maneuver your car and apply brakes. b. RETURN TO WORK:  ______________________________________________________________________________________ 9. You should see your doctor in the office for a follow-up appointment approximately two weeks after your surgery.  Your doctors nurse will typically make your follow-up appointment when she calls you with your pathology report.  Expect your pathology report 2-3 business days after your surgery.  You may call to check if you do not hear from [name] after three days. 10. OTHER INSTRUCTIONS: _______________________________________________________________________________________________ _____________________________________________________________________________________________________________________________________ _____________________________________________________________________________________________________________________________________ _____________________________________________________________________________________________________________________________________  WHEN TO CALL YOUR DOCTOR: 1. Fever over 101.0 2. Nausea and/or vomiting. 3. Extreme swelling or bruising. 4. Continued bleeding from incision. 5. Increased pain, redness, or drainage from the incision.  The clinic staff is available to answer your questions during regular business hours.  Please dont hesitate to call and ask to speak to one of the nurses for clinical concerns.  If you have a medical emergency, go to the nearest  emergency room or call 911.  A surgeon from [facility] is always on call at the hospital.  For further questions, please visit [email or web address]  [place] Office Phone Number [phone]  BREAST BIOPSY/ PARTIAL MASTECTOMY: POST OP INSTRUCTIONS  Always review your discharge instruction sheet given to you by the facility where your surgery was performed.  IF YOU HAVE DISABILITY OR FAMILY LEAVE FORMS, YOU MUST BRING THEM TO THE OFFICE FOR PROCESSING.  DO NOT GIVE THEM TO YOUR DOCTOR.  11. A prescription for pain medication may be given to you upon discharge.  Take your pain medication as prescribed, if needed.  If narcotic pain medicine is not needed, then you may take acetaminophen (Tylenol) or ibuprofen (Advil) as needed. 12. Take your usually prescribed medications unless otherwise directed 13. If you need a refill on your pain medication, please contact your pharmacy.  They will contact our office to request authorization.  Prescriptions will not be filled after 5pm or on week-ends. 14. You should eat very light the first 24 hours after surgery, such as soup, crackers, pudding, etc.  Resume your normal diet the day after surgery. 15. Most patients will experience some swelling and bruising in the breast.  Ice packs and a good support bra will help.  Swelling and bruising can take several days to resolve.  16. It is common to experience some constipation if taking pain medication after surgery.  Increasing fluid intake and taking a stool softener will usually help or prevent this problem from occurring.  A mild laxative (Milk of Magnesia or Miralax) should be taken according to package directions if there are no bowel movements after 48 hours. 17. Unless discharge instructions indicate otherwise, you may remove your bandages 24-48 hours after surgery, and you may shower at that time.  You may have steri-strips (small skin tapes) in place directly over the  incision.  These strips should be left on the skin for 7-10 days.  If your surgeon used skin glue on the incision, you may shower in 24 hours.  The glue will flake off over the next 2-3 weeks.  Any sutures or staples will be removed at the office during your follow-up visit. 18. ACTIVITIES:  You may resume regular daily activities (gradually increasing) beginning the next day.  Wearing a good support bra or sports bra minimizes pain and swelling.  You may have sexual intercourse when it is comfortable. a. You may drive when you no longer are taking prescription pain medication, you can comfortably wear a seatbelt, and you can safely maneuver your car and apply brakes. b. RETURN TO WORK:  ______________________________________________________________________________________ 19. You should see your doctor in the office for a follow-up appointment approximately two weeks after your surgery.  Your doctors nurse will typically make your follow-up appointment when she calls you with your pathology report.  Expect your pathology report 2-3 business days after your surgery.  You may call to check if you do not hear from [name] after three days. 20. OTHER INSTRUCTIONS: _______________________________________________________________________________________________ _____________________________________________________________________________________________________________________________________ _____________________________________________________________________________________________________________________________________ _____________________________________________________________________________________________________________________________________  WHEN TO CALL YOUR DOCTOR: 6. Fever over 101.0 7. Nausea and/or vomiting. 8. Extreme swelling or bruising. 9. Continued bleeding from incision. 10. Increased pain, redness, or drainage from the incision.  The clinic staff is available to answer your questions  during regular business hours.  Please dont hesitate to call and ask to speak to one of the nurses for clinical concerns.  If you have a medical emergency, go to the nearest emergency room or call 911.  A surgeon from [facility] is always on call at the hospital.  For further questions, please visit [email or web address]  [facility] _______[facility]  UMBILICAL OR INGUINAL HERNIA REPAIR: POST OP INSTRUCTIONS  Always review your discharge instruction sheet given to you by the facility where your surgery was performed. IF YOU HAVE DISABILITY OR FAMILY LEAVE FORMS, YOU MUST BRING THEM TO THE OFFICE FOR PROCESSING.   DO NOT GIVE THEM TO YOUR DOCTOR.  1. A  prescription for pain medication may be given to you upon discharge.  Take your pain medication as prescribed, if needed.  If narcotic pain medicine is not needed, then you may take acetaminophen (Tylenol) or ibuprofen (Advil) as needed. 2. Take your usually prescribed medications unless otherwise directed. If you need a refill on your pain medication, please contact your pharmacy.  They will contact our office to request authorization. Prescriptions will not be filled after 5 pm or on week-ends. 3. You should follow a light diet the first 24 hours after arrival home, such as soup and crackers, etc.  Be sure to include lots of fluids daily.  Resume your normal diet the day after surgery. 4.Most patients will experience some swelling and bruising around the umbilicus or in the groin and scrotum.  Ice packs and reclining will help.  Swelling and bruising can take several days to resolve.  6. It is common to experience some constipation if taking pain medication after surgery.  Increasing fluid intake and taking a stool softener (such as Colace) will usually help or prevent this problem from occurring.  A mild laxative (Milk of Magnesia or Miralax) should be taken according to package directions if there are no bowel  movements after 48 hours. 7. Unless discharge instructions indicate otherwise, you may remove your bandages 24-48 hours after surgery, and you may shower at that time.  You may have steri-strips (small skin tapes) in place directly over the incision.  These strips should be left on the skin for 7-10 days.  If your surgeon used skin glue on the incision, you may shower in 24 hours.  The glue will flake off over the next 2-3 weeks.  Any sutures or staples will be removed at the office during your follow-up visit. 8. ACTIVITIES:  You may resume regular (light) daily activities beginning the next day--such as daily self-care, walking, climbing stairs--gradually increasing activities as tolerated.  You may have sexual intercourse when it is comfortable.  Refrain from any heavy lifting or straining until approved by your doctor.  a.You may drive when you are no longer taking prescription pain medication, you can comfortably wear a seatbelt, and you can safely maneuver your car and apply brakes. b.RETURN TO WORK:   _____________________________________________  9.You should see your doctor in the office for a follow-up appointment approximately 2-3 weeks after your surgery.  Make sure that you call for this appointment within a day or two after you arrive home to insure a convenient appointment time. 10.OTHER INSTRUCTIONS: _________________________    _____________________________________  WHEN TO CALL YOUR DOCTOR: 11. Fever over 101.0 12. Inability to urinate 13. Nausea and/or vomiting 14. Extreme swelling or bruising 15. Continued bleeding from incision. 16. Increased pain, redness, or drainage from the incision  The clinic staff is available to answer your questions during regular business hours.  Please dont hesitate to call and ask to speak to one of the nurses for clinical concerns.  If you have a medical emergency, go to the nearest emergency room or call 911.  A surgeon from [facility] is always on call at the hospital  [address], [address], [place], [place]  [address] ? ° [address] [address], [place], [place]   [address] °[phone] ? [phone] ? FAX [phone] °Web site: [email or web address] °

## 2018-07-20 NOTE — Interval H&P Note (Signed)
History and Physical Interval Note:no change in H and P  07/20/2018 11:43 AM  Kristin Braun  has presented today for surgery, with the diagnosis of ATYPICAL LOBULAR HYPERPLASIA RIGHT BREAST, INCISIONAL HERNIA.  The various methods of treatment have been discussed with the patient and family. After consideration of risks, benefits and other options for treatment, the patient has consented to  Procedure(s): RIGHT BREAST LUMPECTOMY WITH RADIOACTIVE SEED LOCALIZATION (Right) OPEN HERNIA REPAIR INCISIONAL W/MESH (N/A) as a surgical intervention.  The patient's history has been reviewed, patient examined, no change in status, stable for surgery.  I have reviewed the patient's chart and labs.  Questions were answered to the patient's satisfaction.     Coralie Keens

## 2018-07-20 NOTE — Op Note (Signed)
RIGHT BREAST LUMPECTOMY WITH RADIOACTIVE SEED LOCALIZATION, OPEN HERNIA REPAIR INCISIONAL W/MESH  Procedure Note  Kristin Braun 07/20/2018   Pre-op Diagnosis: ATYPICAL LOBULAR HYPERPLASIA RIGHT BREAST, INCISIONAL HERNIA     Post-op Diagnosis: same  Procedure(s): RIGHT BREAST LUMPECTOMY WITH RADIOACTIVE SEED LOCALIZATION OPEN HERNIA REPAIR INCISIONAL W/MESH  Surgeon(s): Coralie Keens, MD Carlena Hurl PA  Anesthesia: General  Staff:  Circulator: Gar Ponto, RN Scrub Person: Lovett Sox, CST; Teschner, Burman Foster, Immunologist: Rosanne Sack, RN  Estimated Blood Loss: Minimal               Specimens: sent to path  Indications: This is a 43 year old female who is undergone a stereotactic biopsy of an abnormal area in the right breast.  The final pathology showed atypical lobular hyperplasia.  The decision was made to proceed with a radioactive seed guided right breast lumpectomy.  She also has a small incisional hernia above the umbilicus which is symptomatic so this will be repaired as well  Procedure: The patient was identified in the holding area and the radioactive seed was confirmed to be in the right breast with the neoprobe.  She was taken to the operating room.  She is placed upon the operating table general anesthesia was induced.  Her right breast and abdomen were then prepped and draped in usual sterile fashion.  Using the neoprobe, the seed was located underneath the areola laterally.  I anesthetized skin with Marcaine and made a circumareolar incision with a scalpel.  We then dissected down to the radioactive seed with the aid of the neoprobe staying widely around the seed.  Once the lumpectomy specimen was removed, I marked the margins with marker paint.  The specimen was then x-rayed and confirmed that the radioactive seed and previous marker were in the specimen.  This was then sent to pathology for evaluation.  Hemostasis was then achieved with  the cautery.  I placed several surgical clips for marker purposes in the lumpectomy cavity.  We then closed the incision with erupted 3-0 Vicryl sutures and a running 4-0 Monocryl. Next I anesthetized the skin over the palpable hernia above the umbilicus with Marcaine.  I made a vertical incision with a scalpel and then dissected down to the hernia.  There is a moderate sized hernia sac containing only omentum and preperitoneal fat which was completely excised.  The fascial defect itself was just over 1 cm in size.  A 4.3 cm round ventral patch was brought to the field.  I placed it through the fascia opening and then pulled it up against the peritoneum with stay ties.  The mesh was then sewn in place circumferentially with interrupted #1 Novafil sutures.  The stay ties were then cut and the fascia was closed over the top of the mesh with figure-of-eight #1 Novafil suture.  I then anesthetized the fascia further with Marcaine.  Hemostasis appeared to be achieved.  The subcutaneous tissue was then closed with interrupted 3-0 Vicryl sutures and the skin was closed with running 4-0 Monocryl.  Dermabond was applied to both incisions.  The patient tolerated the procedure well.  All the counts were correct at the end of the procedure.  The patient was then extubated in the operating room and taken in stable condition to the recovery room.          Coralie Keens   Date: 07/20/2018  Time: 1:16 PM

## 2018-07-20 NOTE — Anesthesia Procedure Notes (Signed)
Procedure Name: Intubation Date/Time: 07/20/2018 12:31 PM Performed by: Mariea Clonts, CRNA Pre-anesthesia Checklist: Patient identified, Emergency Drugs available, Suction available and Patient being monitored Patient Re-evaluated:Patient Re-evaluated prior to induction Oxygen Delivery Method: Circle System Utilized Preoxygenation: Pre-oxygenation with 100% oxygen Induction Type: IV induction Ventilation: Mask ventilation without difficulty Laryngoscope Size: Miller and 2 Grade View: Grade I Tube type: Oral Tube size: 7.0 mm Number of attempts: 1 Airway Equipment and Method: Stylet and Oral airway Placement Confirmation: ETT inserted through vocal cords under direct vision,  positive ETCO2 and breath sounds checked- equal and bilateral Tube secured with: Tape Dental Injury: Teeth and Oropharynx as per pre-operative assessment

## 2018-07-20 NOTE — Anesthesia Postprocedure Evaluation (Signed)
Anesthesia Post Note  Patient: Tru Leopard  Procedure(s) Performed: RIGHT BREAST LUMPECTOMY WITH RADIOACTIVE SEED LOCALIZATION (Right Breast) OPEN HERNIA REPAIR INCISIONAL W/MESH (N/A Abdomen)     Patient location during evaluation: PACU Anesthesia Type: General Level of consciousness: awake and alert Pain management: pain level controlled Vital Signs Assessment: post-procedure vital signs reviewed and stable Respiratory status: spontaneous breathing, nonlabored ventilation and respiratory function stable Cardiovascular status: blood pressure returned to baseline and stable Postop Assessment: no apparent nausea or vomiting Anesthetic complications: no    Last Vitals:  Vitals:   07/20/18 1415 07/20/18 1430  BP: 119/76 126/84  Pulse: 62 61  Resp: 20 20  Temp: (!) 36.4 C   SpO2: 100% 100%    Last Pain:  Vitals:   07/20/18 1333  TempSrc:   PainSc: 0-No pain                 Lidia Collum

## 2018-07-20 NOTE — Transfer of Care (Signed)
Immediate Anesthesia Transfer of Care Note  Patient: Kristin Braun  Procedure(s) Performed: RIGHT BREAST LUMPECTOMY WITH RADIOACTIVE SEED LOCALIZATION (Right Breast) OPEN HERNIA REPAIR INCISIONAL W/MESH (N/A Abdomen)  Patient Location: PACU  Anesthesia Type:General  Level of Consciousness: awake, alert  and oriented  Airway & Oxygen Therapy: Patient Spontanous Breathing and Patient connected to nasal cannula oxygen  Post-op Assessment: Report given to RN, Post -op Vital signs reviewed and stable and Patient moving all extremities X 4  Post vital signs: Reviewed and stable  Last Vitals:  Vitals Value Taken Time  BP 101/69 07/20/2018  1:33 PM  Temp    Pulse 86 07/20/2018  1:34 PM  Resp 15 07/20/2018  1:34 PM  SpO2 100 % 07/20/2018  1:34 PM  Vitals shown include unvalidated device data.  Last Pain:  Vitals:   07/20/18 1130  TempSrc:   PainSc: 0-No pain         Complications: No apparent anesthesia complications

## 2018-07-21 ENCOUNTER — Ambulatory Visit (INDEPENDENT_AMBULATORY_CARE_PROVIDER_SITE_OTHER): Payer: Medicare HMO | Admitting: Psychiatry

## 2018-07-21 ENCOUNTER — Encounter (HOSPITAL_COMMUNITY): Payer: Self-pay | Admitting: Surgery

## 2018-07-21 DIAGNOSIS — G4700 Insomnia, unspecified: Secondary | ICD-10-CM

## 2018-07-21 DIAGNOSIS — F4312 Post-traumatic stress disorder, chronic: Secondary | ICD-10-CM

## 2018-07-21 DIAGNOSIS — G4089 Other seizures: Secondary | ICD-10-CM | POA: Diagnosis not present

## 2018-07-21 DIAGNOSIS — F419 Anxiety disorder, unspecified: Secondary | ICD-10-CM | POA: Diagnosis not present

## 2018-07-21 DIAGNOSIS — F3162 Bipolar disorder, current episode mixed, moderate: Secondary | ICD-10-CM

## 2018-07-21 DIAGNOSIS — C50911 Malignant neoplasm of unspecified site of right female breast: Secondary | ICD-10-CM | POA: Diagnosis not present

## 2018-07-21 MED ORDER — ARIPIPRAZOLE (SENSOR) 20 MG PO TABS: 20.0000 mg | ORAL_TABLET | Freq: Every day | ORAL | 1 refills | Status: DC

## 2018-07-21 MED ORDER — CLONAZEPAM 0.5 MG PO TABS: 0.5000 mg | ORAL_TABLET | Freq: Three times a day (TID) | ORAL | 1 refills | Status: DC | PRN

## 2018-07-21 MED ORDER — TRAZODONE HCL 150 MG PO TABS: 150.0000 mg | ORAL_TABLET | Freq: Every evening | ORAL | 1 refills | Status: DC | PRN

## 2018-07-21 MED ORDER — OXCARBAZEPINE 300 MG PO TABS: 300.0000 mg | ORAL_TABLET | Freq: Two times a day (BID) | ORAL | 1 refills | Status: DC
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
## 2018-07-23 ENCOUNTER — Other Ambulatory Visit: Payer: Self-pay | Admitting: Gastroenterology

## 2018-07-23 DIAGNOSIS — F419 Anxiety disorder, unspecified: Secondary | ICD-10-CM | POA: Diagnosis not present

## 2018-07-23 DIAGNOSIS — G4089 Other seizures: Secondary | ICD-10-CM | POA: Diagnosis not present

## 2018-07-23 DIAGNOSIS — C50911 Malignant neoplasm of unspecified site of right female breast: Secondary | ICD-10-CM | POA: Diagnosis not present

## 2018-07-26 ENCOUNTER — Other Ambulatory Visit: Payer: Self-pay | Admitting: Gastroenterology

## 2018-07-26 DIAGNOSIS — F419 Anxiety disorder, unspecified: Secondary | ICD-10-CM | POA: Diagnosis not present

## 2018-07-26 DIAGNOSIS — C50911 Malignant neoplasm of unspecified site of right female breast: Secondary | ICD-10-CM | POA: Diagnosis not present

## 2018-07-26 DIAGNOSIS — G4089 Other seizures: Secondary | ICD-10-CM | POA: Diagnosis not present

## 2018-07-26 NOTE — Progress Notes (Unsigned)
Ordering preprocedure Covid testing

## 2018-07-28 ENCOUNTER — Other Ambulatory Visit: Payer: Self-pay

## 2018-07-28 DIAGNOSIS — G4089 Other seizures: Secondary | ICD-10-CM | POA: Diagnosis not present

## 2018-07-28 DIAGNOSIS — C50911 Malignant neoplasm of unspecified site of right female breast: Secondary | ICD-10-CM | POA: Diagnosis not present

## 2018-07-28 DIAGNOSIS — F419 Anxiety disorder, unspecified: Secondary | ICD-10-CM | POA: Diagnosis not present

## 2018-08-03 ENCOUNTER — Ambulatory Visit: Payer: Medicare HMO | Attending: Neurology | Admitting: Physical Therapy

## 2018-08-03 DIAGNOSIS — F419 Anxiety disorder, unspecified: Secondary | ICD-10-CM | POA: Diagnosis not present

## 2018-08-03 DIAGNOSIS — G4089 Other seizures: Secondary | ICD-10-CM | POA: Diagnosis not present

## 2018-08-03 DIAGNOSIS — C50911 Malignant neoplasm of unspecified site of right female breast: Secondary | ICD-10-CM | POA: Diagnosis not present

## 2018-08-07 ENCOUNTER — Other Ambulatory Visit: Payer: Self-pay | Admitting: Family Medicine

## 2018-08-07 DIAGNOSIS — M79604 Pain in right leg: Secondary | ICD-10-CM

## 2018-08-10 DIAGNOSIS — C50911 Malignant neoplasm of unspecified site of right female breast: Secondary | ICD-10-CM | POA: Diagnosis not present

## 2018-08-10 DIAGNOSIS — F419 Anxiety disorder, unspecified: Secondary | ICD-10-CM | POA: Diagnosis not present

## 2018-08-10 DIAGNOSIS — G4089 Other seizures: Secondary | ICD-10-CM | POA: Diagnosis not present

## 2018-08-11 ENCOUNTER — Other Ambulatory Visit (HOSPITAL_COMMUNITY): Payer: Self-pay | Admitting: *Deleted

## 2018-08-11 ENCOUNTER — Other Ambulatory Visit (HOSPITAL_COMMUNITY): Payer: Medicare HMO

## 2018-08-11 ENCOUNTER — Telehealth (INDEPENDENT_AMBULATORY_CARE_PROVIDER_SITE_OTHER): Payer: Medicare HMO | Admitting: Family Medicine

## 2018-08-11 DIAGNOSIS — R102 Pelvic and perineal pain: Secondary | ICD-10-CM

## 2018-08-11 NOTE — Telephone Encounter (Signed)
The below message was in the patient's check out chart. Can someone reach out to U/S to assist with scheduling? I informed her someone would call her back.  Thanks!   Patient needs to return following pelvic ultrasound for follow up.

## 2018-08-11 NOTE — Telephone Encounter (Signed)
Called and scheduled Pelvic US for pt for July 6 @ 08:45 to come to [facility] with a full bladder. Pt has follow up with Provider in August. Pt voiced understanding.

## 2018-08-14 ENCOUNTER — Other Ambulatory Visit (HOSPITAL_COMMUNITY): Payer: Self-pay | Admitting: Psychiatry

## 2018-08-16 ENCOUNTER — Telehealth: Payer: Self-pay | Admitting: Neurology

## 2018-08-16 NOTE — Telephone Encounter (Signed)
Called and spoke [place] and Grievance and appeal  I had to fax them notes and put in an expiated grievance appeal faxed to [phone] - telephone [phone] . Process will take 72 hour.   I have called [facility] and CX apt and talked to patient as well. Patient understands all details.
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
## 2018-08-16 NOTE — Telephone Encounter (Signed)
Pt is asking if Dr Brett Fairy can do a peer to peer with the Epilepsy monitoring Unit @ [facility], please call pt re: information that the Epilepsy monitoring Unit needs

## 2018-08-16 NOTE — Telephone Encounter (Signed)
Patient is scheduled to come in to the EMU Unit 08/17/2018 at 10:00 am Dr. Brett Fairy  she needs a peer to peer with [facility] .  [employer]  has denied.  Dr. Brett Fairy Im sorry you have to do this today!   [phone] opt [phone] ext [phone] case # [id number] member ID  [id number]   [name].

## 2018-08-22 ENCOUNTER — Ambulatory Visit (HOSPITAL_COMMUNITY): Admission: RE | Admit: 2018-08-22 | Payer: Medicare HMO | Source: Ambulatory Visit

## 2018-08-22 ENCOUNTER — Other Ambulatory Visit: Payer: Self-pay

## 2018-08-22 ENCOUNTER — Ambulatory Visit (INDEPENDENT_AMBULATORY_CARE_PROVIDER_SITE_OTHER): Payer: Medicare HMO | Admitting: Psychiatry

## 2018-08-22 DIAGNOSIS — F4312 Post-traumatic stress disorder, chronic: Secondary | ICD-10-CM

## 2018-08-22 DIAGNOSIS — G4700 Insomnia, unspecified: Secondary | ICD-10-CM

## 2018-08-22 DIAGNOSIS — F3162 Bipolar disorder, current episode mixed, moderate: Secondary | ICD-10-CM

## 2018-08-22 DIAGNOSIS — F419 Anxiety disorder, unspecified: Secondary | ICD-10-CM | POA: Diagnosis not present

## 2018-08-22 MED ORDER — TRAZODONE HCL 150 MG PO TABS: 150.0000 mg | ORAL_TABLET | Freq: Every evening | ORAL | 1 refills | Status: DC | PRN

## 2018-08-22 MED ORDER — OXCARBAZEPINE 300 MG PO TABS: 300.0000 mg | ORAL_TABLET | Freq: Two times a day (BID) | ORAL | 1 refills | Status: DC

## 2018-08-22 MED ORDER — CLONAZEPAM 0.5 MG PO TABS: 0.5000 mg | ORAL_TABLET | Freq: Three times a day (TID) | ORAL | 1 refills | Status: DC | PRN

## 2018-08-22 MED ORDER — OLANZAPINE 10 MG PO TABS: 10.0000 mg | ORAL_TABLET | Freq: Every day | ORAL | 0 refills | Status: DC
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
## 2018-08-23 ENCOUNTER — Other Ambulatory Visit: Payer: Self-pay

## 2018-08-23 ENCOUNTER — Ambulatory Visit (INDEPENDENT_AMBULATORY_CARE_PROVIDER_SITE_OTHER): Payer: Medicare HMO | Admitting: Adult Health

## 2018-08-23 ENCOUNTER — Encounter: Payer: Self-pay | Admitting: Adult Health

## 2018-08-23 VITALS — BP 110/88 | HR 84 | Temp 98.0°F | Ht 64.0 in | Wt 175.6 lb

## 2018-08-23 DIAGNOSIS — R569 Unspecified convulsions: Secondary | ICD-10-CM

## 2018-08-23 DIAGNOSIS — IMO0002 Reserved for concepts with insufficient information to code with codable children: Secondary | ICD-10-CM

## 2018-08-23 DIAGNOSIS — G43709 Chronic migraine without aura, not intractable, without status migrainosus: Secondary | ICD-10-CM | POA: Diagnosis not present

## 2018-08-23 DIAGNOSIS — R6889 Other general symptoms and signs: Secondary | ICD-10-CM | POA: Diagnosis not present

## 2018-08-23 NOTE — Telephone Encounter (Signed)
Patient was approved and will be admitted 08/26/2018 [facility] . Patient is aware of details.  Grievance and Appeal was approved buy her insurance company.

## 2018-08-23 NOTE — Patient Instructions (Signed)
Your Plan:  Continue Topamax Keep appt Friday If your symptoms worsen or you develop new symptoms please let us know.    Thank you for coming to see [name] at [facility]. I hope we have been able to provide you high quality care today.  You may receive a patient satisfaction survey over the next few weeks. We would appreciate your feedback and comments so that we may continue to improve ourselves and the health of our patients.
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
## 2018-08-24 NOTE — Telephone Encounter (Signed)
She will be covid tested there? 3 days prior to admission. We must make sure this doesn't fall through the cracks. Marland Kitchen

## 2018-08-24 NOTE — Telephone Encounter (Signed)
Sandy from [facility] has already talked to patient and she is aware of all details of [facility] and apt.

## 2018-08-25 ENCOUNTER — Other Ambulatory Visit (HOSPITAL_COMMUNITY): Payer: Medicare HMO | Attending: Gastroenterology

## 2018-08-25 ENCOUNTER — Telehealth: Payer: Self-pay | Admitting: Gastroenterology

## 2018-08-25 NOTE — Progress Notes (Signed)
Spoke to [place] at Dr Doyne Keel office to alert them Kristin Braun did not come in today 08/25/18 for her pre-procedure Covid 19 test.

## 2018-08-25 NOTE — Progress Notes (Signed)
Message left for Mrs Dayrit to inquire her arrival for Covid 19 testing for today prior to procedure 08/29/18.

## 2018-08-25 NOTE — Telephone Encounter (Signed)
Ginger from [facility] hospital called to inform that pt was a no-show for her Covid-19 test today. She left a message on pt's phone but pt did not call back.

## 2018-08-26 DIAGNOSIS — G43909 Migraine, unspecified, not intractable, without status migrainosus: Secondary | ICD-10-CM | POA: Diagnosis not present

## 2018-08-26 DIAGNOSIS — G4700 Insomnia, unspecified: Secondary | ICD-10-CM | POA: Diagnosis not present

## 2018-08-26 DIAGNOSIS — R064 Hyperventilation: Secondary | ICD-10-CM | POA: Diagnosis not present

## 2018-08-26 DIAGNOSIS — F3189 Other bipolar disorder: Secondary | ICD-10-CM | POA: Diagnosis not present

## 2018-08-26 DIAGNOSIS — R32 Unspecified urinary incontinence: Secondary | ICD-10-CM | POA: Diagnosis not present

## 2018-08-26 DIAGNOSIS — R404 Transient alteration of awareness: Secondary | ICD-10-CM | POA: Diagnosis not present

## 2018-08-26 DIAGNOSIS — H5319 Other subjective visual disturbances: Secondary | ICD-10-CM | POA: Diagnosis not present

## 2018-08-26 DIAGNOSIS — R55 Syncope and collapse: Secondary | ICD-10-CM | POA: Diagnosis not present

## 2018-08-26 DIAGNOSIS — G43119 Migraine with aura, intractable, without status migrainosus: Secondary | ICD-10-CM | POA: Diagnosis not present

## 2018-08-26 DIAGNOSIS — F4310 Post-traumatic stress disorder, unspecified: Secondary | ICD-10-CM | POA: Diagnosis not present

## 2018-08-26 DIAGNOSIS — R569 Unspecified convulsions: Secondary | ICD-10-CM | POA: Diagnosis not present

## 2018-08-26 DIAGNOSIS — F410 Panic disorder [episodic paroxysmal anxiety] without agoraphobia: Secondary | ICD-10-CM | POA: Diagnosis not present

## 2018-08-26 DIAGNOSIS — R9401 Abnormal electroencephalogram [EEG]: Secondary | ICD-10-CM | POA: Diagnosis not present

## 2018-08-26 DIAGNOSIS — G629 Polyneuropathy, unspecified: Secondary | ICD-10-CM | POA: Diagnosis not present

## 2018-08-26 DIAGNOSIS — R258 Other abnormal involuntary movements: Secondary | ICD-10-CM | POA: Diagnosis not present

## 2018-08-26 NOTE — Telephone Encounter (Signed)
Okay thanks for letting me know. She will have to reschedule if she cannot get the test today. Thanks

## 2018-08-26 NOTE — Telephone Encounter (Signed)
I tried today to reach patient via phone and My Chart to let her know if she can come and get her COVID testing done today, we can still do her Esohageal Manometry Mon. 08/29/18. Otherwise the hospital will not let us do it. She did not show for her scheduled testing yesterday.

## 2018-08-30 ENCOUNTER — Other Ambulatory Visit: Payer: Self-pay

## 2018-08-30 ENCOUNTER — Ambulatory Visit (HOSPITAL_COMMUNITY): Payer: Medicare HMO | Attending: Obstetrics and Gynecology

## 2018-08-30 ENCOUNTER — Encounter (HOSPITAL_COMMUNITY): Payer: Self-pay

## 2018-08-30 MED ORDER — CLONAZEPAM 0.5 MG PO TABS
0.50 | ORAL_TABLET | ORAL | Status: DC
Start: ? — End: 2018-08-30

## 2018-08-30 MED ORDER — GENERIC EXTERNAL MEDICATION
10.00 | Status: DC
Start: 2018-08-29 — End: 2018-08-30

## 2018-08-30 MED ORDER — GENERIC EXTERNAL MEDICATION
10.00 | Status: DC
Start: ? — End: 2018-08-30

## 2018-08-30 MED ORDER — OXCARBAZEPINE 150 MG PO TABS
150.00 | ORAL_TABLET | ORAL | Status: DC
Start: 2018-08-29 — End: 2018-08-30

## 2018-08-30 MED ORDER — GENERIC EXTERNAL MEDICATION
1.00 | Status: DC
Start: 2018-08-30 — End: 2018-08-30

## 2018-08-30 MED ORDER — GABAPENTIN 100 MG PO CAPS
100.00 | ORAL_CAPSULE | ORAL | Status: DC
Start: 2018-08-29 — End: 2018-08-30

## 2018-08-30 MED ORDER — TRAZODONE HCL 50 MG PO TABS
150.00 | ORAL_TABLET | ORAL | Status: DC
Start: 2018-08-29 — End: 2018-08-30

## 2018-08-30 MED ORDER — ACETAMINOPHEN 325 MG PO TABS
650.00 | ORAL_TABLET | ORAL | Status: DC
Start: ? — End: 2018-08-30

## 2018-08-30 MED ORDER — LORAZEPAM 2 MG/ML IJ SOLN
2.00 | INTRAMUSCULAR | Status: DC
Start: ? — End: 2018-08-30

## 2018-08-30 MED ORDER — SUCRALFATE 1 GM/10ML PO SUSP
1.00 | ORAL | Status: DC
Start: ? — End: 2018-08-30

## 2018-08-30 MED ORDER — TOPIRAMATE 100 MG PO TABS
200.00 | ORAL_TABLET | ORAL | Status: DC
Start: 2018-08-29 — End: 2018-08-30

## 2018-08-30 MED ORDER — CHOLECALCIFEROL 25 MCG (1000 UT) PO TABS
5000.00 | ORAL_TABLET | ORAL | Status: DC
Start: 2018-08-30 — End: 2018-08-30

## 2018-08-30 MED ORDER — PANTOPRAZOLE SODIUM 40 MG PO TBEC
40.00 | DELAYED_RELEASE_TABLET | ORAL | Status: DC
Start: 2018-08-30 — End: 2018-08-30

## 2018-08-30 MED ORDER — METOCLOPRAMIDE HCL 10 MG PO TABS
10.00 | ORAL_TABLET | ORAL | Status: DC
Start: ? — End: 2018-08-30

## 2018-08-30 NOTE — Patient Outreach (Signed)
[place] [facility]) Care Management  08/30/2018  Kristin Braun May 06, 1975 [id number]   Referral Date: 08/30/2018 Referral Source:  [employer] Report Date of Admission: 08/26/2018 Diagnosis: Seizures Date of Discharge: 08/29/2018 Facility:  [place] attempt: no answer.  HIPAA compliant voice message left.   Plan: RN CM will attempt again within four business days and send letter.    Jone Baseman, RN, MSN [facility] Care Management Care Management Coordinator Direct Line [phone] Toll Free: [phone]  Fax: [phone]

## 2018-08-31 ENCOUNTER — Encounter: Payer: Self-pay | Admitting: Neurology

## 2018-09-02 ENCOUNTER — Other Ambulatory Visit: Payer: Self-pay

## 2018-09-02 NOTE — Patient Outreach (Signed)
[place] [facility]) Care Management  09/02/2018  Kristin Braun 02/05/1976 [id number]   Referral Date: 08/30/2018 Referral Source:  [employer] Report Date of Admission: 08/26/2018 Diagnosis: Seizures Date of Discharge: 08/29/2018 Facility:  [place]:  [place] attempt: Spoke with patient.  She is able to verify HIPAA.  Patient states that she is doing ok just tired.  She states that she does not have an appointment set with her PCP- Dr. Nancy Fetter but will be doing so.  She states she notified her neurologist and neurologist did not feel the need for a follow up at this time but will see patient as scheduled later in the year.  Patient states she has all her medications and denies any needs at this time.    Plan: RN CM will close case at this time as PCP is not part of [facility] network.   Jone Baseman, RN, MSN [place] Management Care Management Coordinator Direct Line [phone] Cell [phone] Toll Free: [phone]  Fax: [phone]

## 2018-09-06 DIAGNOSIS — R3129 Other microscopic hematuria: Secondary | ICD-10-CM | POA: Diagnosis not present

## 2018-09-06 DIAGNOSIS — B373 Candidiasis of vulva and vagina: Secondary | ICD-10-CM | POA: Diagnosis not present

## 2018-09-06 DIAGNOSIS — R300 Dysuria: Secondary | ICD-10-CM | POA: Diagnosis not present

## 2018-09-06 DIAGNOSIS — R635 Abnormal weight gain: Secondary | ICD-10-CM | POA: Diagnosis not present

## 2018-09-07 ENCOUNTER — Other Ambulatory Visit: Payer: Self-pay

## 2018-09-07 ENCOUNTER — Ambulatory Visit (INDEPENDENT_AMBULATORY_CARE_PROVIDER_SITE_OTHER): Payer: Medicare HMO | Admitting: Neurology

## 2018-09-07 VITALS — Temp 98.4°F

## 2018-09-07 DIAGNOSIS — G43711 Chronic migraine without aura, intractable, with status migrainosus: Secondary | ICD-10-CM

## 2018-09-07 DIAGNOSIS — R6889 Other general symptoms and signs: Secondary | ICD-10-CM | POA: Diagnosis not present

## 2018-09-07 NOTE — Progress Notes (Signed)
Botox- 100 units x 2 vials Lot: [id number] Expiration: 04/2021 NDC: 5868-2574-93  Bacteriostatic 0.9% Sodium Chloride- 49mL total Lot: [id number] Expiration: 11/17/2018 NDC: 7159-5396-72  Dx: W97.915 B/B

## 2018-09-07 NOTE — Progress Notes (Signed)
Consent Form Botulism Toxin Injection For Chronic Migraine  Interval history 09/07/2018: Dry Needling for cervical myofascial pain syndrome and migraines at last visit 3 months ago but can't get to [place] has a transportation issue. She may try again when she moves closer.  This is our second botox injection and she is doing extremely well. She can tell the botox is wearing off the last 2 weeks.  At baseline she has  She has 15 migraine days a month and daily headaches. She has 8 headache free days a month which she never had before and she has 7 migraine days a month which is 50% better, she definitely saw an improvement.    +all  Reviewed orally with patient, additionally signature is on file:  Botulism toxin has been approved by the Federal drug administration for treatment of chronic migraine. Botulism toxin does not cure chronic migraine and it may not be effective in some patients.  The administration of botulism toxin is accomplished by injecting a small amount of toxin into the muscles of the neck and head. Dosage must be titrated for each individual. Any benefits resulting from botulism toxin tend to wear off after 3 months with a repeat injection required if benefit is to be maintained. Injections are usually done every 3-4 months with maximum effect peak achieved by about 2 or 3 weeks. Botulism toxin is expensive and you should be sure of what costs you will incur resulting from the injection.  The side effects of botulism toxin use for chronic migraine may include:   -Transient, and usually mild, facial weakness with facial injections  -Transient, and usually mild, head or neck weakness with head/neck injections  -Reduction or loss of forehead facial animation due to forehead muscle weakness  -Eyelid drooping  -Dry eye  -Pain at the site of injection or bruising at the site of injection  -Double vision  -Potential unknown long term risks  Contraindications: You should not  have Botox if you are pregnant, nursing, allergic to albumin, have an infection, skin condition, or muscle weakness at the site of the injection, or have myasthenia gravis, Lambert-Eaton syndrome, or ALS.  It is also possible that as with any injection, there may be an allergic reaction or no effect from the medication. Reduced effectiveness after repeated injections is sometimes seen and rarely infection at the injection site may occur. All care will be taken to prevent these side effects. If therapy is given over a long time, atrophy and wasting in the muscle injected may occur. Occasionally the patient's become refractory to treatment because they develop antibodies to the toxin. In this event, therapy needs to be modified.  I have read the above information and consent to the administration of botulism toxin.    BOTOX PROCEDURE NOTE FOR MIGRAINE HEADACHE    Contraindications and precautions discussed with patient(above). Aseptic procedure was observed and patient tolerated procedure. Procedure performed by Dr. Georgia Dom  The condition has existed for more than 6 months, and pt does not have a diagnosis of ALS, Myasthenia Gravis or Lambert-Eaton Syndrome.  Risks and benefits of injections discussed and pt agrees to proceed with the procedure.  Written consent obtained  These injections are medically necessary. Pt  receives good benefits from these injections. These injections do not cause sedations or hallucinations which the oral therapies may cause.  Description of procedure:  The patient was placed in a sitting position. The standard protocol was used for Botox as follows, with 5 units  of Botox injected at each site:   -Procerus muscle, midline injection  -Corrugator muscle, bilateral injection  -Frontalis muscle, bilateral injection, with 2 sites each side, medial injection was performed in the upper one third of the frontalis muscle, in the region vertical from the medial inferior  edge of the superior orbital rim. The lateral injection was again in the upper one third of the forehead vertically above the lateral limbus of the cornea, 1.5 cm lateral to the medial injection site.  - Levator Scapulae: 5 units bilaterally  -Temporalis muscle injection, 5 sites, bilaterally. The first injection was 3 cm above the tragus of the ear, second injection site was 1.5 cm to 3 cm up from the first injection site in line with the tragus of the ear. The third injection site was 1.5-3 cm forward between the first 2 injection sites. The fourth injection site was 1.5 cm posterior to the second injection site. 5th site laterally in the temporalis  muscleat the level of the outer canthus.  - Patient feels her clenching is a trigger for headaches. +5 units masseter bilaterally   - Patient feels the migraines are centered around the eyes +5 units bilaterally at the outer canthus in the orbicularis occuli  -Occipitalis muscle injection, 3 sites, bilaterally. The first injection was done one half way between the occipital protuberance and the tip of the mastoid process behind the ear. The second injection site was done lateral and superior to the first, 1 fingerbreadth from the first injection. The third injection site was 1 fingerbreadth superiorly and medially from the first injection site.  -Cervical paraspinal muscle injection, 2 sites, bilateral knee first injection site was 1 cm from the midline of the cervical spine, 3 cm inferior to the lower border of the occipital protuberance. The second injection site was 1.5 cm superiorly and laterally to the first injection site.  -Trapezius muscle injection was performed at 3 sites, bilaterally. The first injection site was in the upper trapezius muscle halfway between the inflection point of the neck, and the acromion. The second injection site was one half way between the acromion and the first injection site. The third injection was done between the  first injection site and the inflection point of the neck.   Will return for repeat injection in 3 months.   A 200 unit sof Botox was used, any Botox not injected was wasted. The patient tolerated the procedure well, there were no complications of the above procedure.

## 2018-09-09 ENCOUNTER — Other Ambulatory Visit (HOSPITAL_COMMUNITY): Admission: RE | Admit: 2018-09-09 | Payer: Medicare HMO | Source: Ambulatory Visit

## 2018-09-16 DIAGNOSIS — R3129 Other microscopic hematuria: Secondary | ICD-10-CM | POA: Diagnosis not present

## 2018-09-16 DIAGNOSIS — R6889 Other general symptoms and signs: Secondary | ICD-10-CM | POA: Diagnosis not present

## 2018-09-19 ENCOUNTER — Telehealth: Payer: Self-pay | Admitting: Advanced Practice Midwife

## 2018-09-19 ENCOUNTER — Telehealth: Payer: Self-pay | Admitting: Gastroenterology

## 2018-09-19 ENCOUNTER — Other Ambulatory Visit: Payer: Self-pay

## 2018-09-19 ENCOUNTER — Telehealth: Payer: Self-pay

## 2018-09-19 DIAGNOSIS — R1310 Dysphagia, unspecified: Secondary | ICD-10-CM

## 2018-09-19 NOTE — Telephone Encounter (Signed)
Called patient back and she does have an esophageal manometry scheduled on 09/28/18. It was rescheduled form 08/29/18 and a new COVID test was not put in. She is now scheduled for COVID test 09/24/18 between 9am-12:30pm at: [facility]. Patient aware of quarantine requirements.

## 2018-09-19 NOTE — Progress Notes (Signed)
Mom calling to discuss isolation guidelines for COVID-19.  Discussed recommendations per CDC.  Mom was concerned daycare was asking for testing after 10 day isolation period.  RN explained, it was not necessary, but daycare could have their own guidelines.  Mom will review CDC guidelines on website and talk with daycare.

## 2018-09-19 NOTE — Telephone Encounter (Signed)
The patient called in regards to an upcoming scheduled appointment. After reviewing the check-out notes it was found she is to be scheduled with Dr. Roland Rack. Around 8/7. Due to the Dr. Roland Rack being here only two days in the month of August and none in September the patient is being placed in a 2:15 slot.

## 2018-09-21 ENCOUNTER — Other Ambulatory Visit: Payer: Self-pay

## 2018-09-21 ENCOUNTER — Ambulatory Visit (INDEPENDENT_AMBULATORY_CARE_PROVIDER_SITE_OTHER): Payer: Medicare HMO | Admitting: Psychiatry

## 2018-09-21 DIAGNOSIS — F319 Bipolar disorder, unspecified: Secondary | ICD-10-CM | POA: Diagnosis not present

## 2018-09-21 DIAGNOSIS — R440 Auditory hallucinations: Secondary | ICD-10-CM

## 2018-09-21 DIAGNOSIS — G4700 Insomnia, unspecified: Secondary | ICD-10-CM

## 2018-09-21 DIAGNOSIS — F4312 Post-traumatic stress disorder, chronic: Secondary | ICD-10-CM

## 2018-09-21 MED ORDER — CLONAZEPAM 1 MG PO TABS: 1.0000 mg | ORAL_TABLET | Freq: Three times a day (TID) | ORAL | 1 refills | Status: DC | PRN

## 2018-09-21 MED ORDER — OLANZAPINE 15 MG PO TABS: 15.0000 mg | ORAL_TABLET | Freq: Every day | ORAL | 0 refills | Status: DC
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
## 2018-09-24 ENCOUNTER — Other Ambulatory Visit (HOSPITAL_COMMUNITY)
Admission: RE | Admit: 2018-09-24 | Discharge: 2018-09-24 | Disposition: A | Discharge status: Home / Self Care | Payer: Medicare HMO | Source: Ambulatory Visit | Attending: Gastroenterology | Admitting: Gastroenterology

## 2018-09-24 DIAGNOSIS — Z01812 Encounter for preprocedural laboratory examination: Secondary | ICD-10-CM | POA: Diagnosis not present

## 2018-09-24 DIAGNOSIS — Z20828 Contact with and (suspected) exposure to other viral communicable diseases: Secondary | ICD-10-CM | POA: Insufficient documentation

## 2018-09-25 LAB — SARS CORONAVIRUS 2 (TAT 6-24 HRS): SARS Coronavirus 2: NEGATIVE

## 2018-09-28 ENCOUNTER — Encounter (HOSPITAL_COMMUNITY)
Admission: RE | Disposition: A | Discharge status: Home / Self Care | Payer: Self-pay | Source: Home / Self Care | Attending: Gastroenterology

## 2018-09-28 ENCOUNTER — Ambulatory Visit (HOSPITAL_COMMUNITY)
Admission: RE | Admit: 2018-09-28 | Discharge: 2018-09-28 | Disposition: A | Discharge status: Home / Self Care | Payer: Medicare HMO | Attending: Gastroenterology | Admitting: Gastroenterology

## 2018-09-28 ENCOUNTER — Ambulatory Visit: Payer: Self-pay | Admitting: Family Medicine

## 2018-09-28 ENCOUNTER — Ambulatory Visit: Payer: Medicare HMO | Admitting: Obstetrics and Gynecology

## 2018-09-28 DIAGNOSIS — R1310 Dysphagia, unspecified: Secondary | ICD-10-CM | POA: Diagnosis not present

## 2018-09-28 DIAGNOSIS — R12 Heartburn: Secondary | ICD-10-CM | POA: Insufficient documentation

## 2018-09-28 DIAGNOSIS — K219 Gastro-esophageal reflux disease without esophagitis: Secondary | ICD-10-CM | POA: Diagnosis not present

## 2018-09-28 DIAGNOSIS — R1110 Vomiting, unspecified: Secondary | ICD-10-CM | POA: Diagnosis not present

## 2018-09-28 DIAGNOSIS — R6889 Other general symptoms and signs: Secondary | ICD-10-CM | POA: Diagnosis not present

## 2018-09-28 HISTORY — PX: PH IMPEDANCE STUDY: SHX5565

## 2018-09-28 HISTORY — PX: ESOPHAGEAL MANOMETRY: SHX5429

## 2018-09-28 SURGERY — MANOMETRY, ESOPHAGUS
Anesthesia: Topical

## 2018-09-28 MED ORDER — LIDOCAINE VISCOUS HCL 2 % MT SOLN
OROMUCOSAL | Status: AC
  Filled 2018-09-28: qty 15

## 2018-09-28 SURGICAL SUPPLY — 2 items
FACESHIELD LNG OPTICON STERILE (SAFETY) IMPLANT
GLOVE BIO SURGEON STRL SZ8 (GLOVE) ×8 IMPLANT

## 2018-09-28 NOTE — Progress Notes (Signed)
Esophageal Manometry done per protocol. Pt tolerated well without complication or distress. Ph probe inserted per protocol. Pt tolerated well. Probe inserted to 31cm. Monitor and study education done per protocol. Pt will return to have probe removed tomorrow at 1120 or after.

## 2018-09-30 ENCOUNTER — Encounter (HOSPITAL_COMMUNITY): Payer: Self-pay | Admitting: Gastroenterology

## 2018-10-03 ENCOUNTER — Ambulatory Visit (HOSPITAL_COMMUNITY): Admission: RE | Admit: 2018-10-03 | Payer: Medicare HMO | Source: Ambulatory Visit

## 2018-10-03 ENCOUNTER — Ambulatory Visit: Payer: Medicare HMO | Admitting: Obstetrics and Gynecology

## 2018-10-07 ENCOUNTER — Other Ambulatory Visit: Payer: Self-pay | Admitting: Gastroenterology

## 2018-10-11 ENCOUNTER — Other Ambulatory Visit: Payer: Self-pay

## 2018-10-11 ENCOUNTER — Ambulatory Visit (HOSPITAL_COMMUNITY)
Admission: RE | Admit: 2018-10-11 | Discharge: 2018-10-11 | Disposition: A | Discharge status: Home / Self Care | Payer: Medicare HMO | Source: Ambulatory Visit | Attending: Obstetrics and Gynecology | Admitting: Obstetrics and Gynecology

## 2018-10-11 DIAGNOSIS — R102 Pelvic and perineal pain: Secondary | ICD-10-CM | POA: Diagnosis not present

## 2018-10-13 DIAGNOSIS — R1110 Vomiting, unspecified: Secondary | ICD-10-CM

## 2018-10-17 ENCOUNTER — Ambulatory Visit (INDEPENDENT_AMBULATORY_CARE_PROVIDER_SITE_OTHER): Payer: Medicare HMO | Admitting: Obstetrics and Gynecology

## 2018-10-17 ENCOUNTER — Encounter: Payer: Self-pay | Admitting: Obstetrics and Gynecology

## 2018-10-17 ENCOUNTER — Other Ambulatory Visit: Payer: Self-pay

## 2018-10-17 ENCOUNTER — Telehealth: Payer: Self-pay

## 2018-10-17 ENCOUNTER — Telehealth: Payer: Self-pay | Admitting: Gastroenterology

## 2018-10-17 VITALS — BP 139/85 | HR 116 | Wt 189.0 lb

## 2018-10-17 DIAGNOSIS — N938 Other specified abnormal uterine and vaginal bleeding: Secondary | ICD-10-CM

## 2018-10-17 DIAGNOSIS — R6889 Other general symptoms and signs: Secondary | ICD-10-CM | POA: Diagnosis not present

## 2018-10-17 MED ORDER — MEGESTROL ACETATE 40 MG PO TABS: 40.0000 mg | ORAL_TABLET | Freq: Two times a day (BID) | ORAL | 5 refills | Status: DC

## 2018-10-17 NOTE — Patient Instructions (Signed)
Levonorgestrel intrauterine device (IUD) What is this medicine? LEVONORGESTREL IUD (LEE voe nor jes trel) is a contraceptive (birth control) device. The device is placed inside the uterus by a healthcare professional. It is used to prevent pregnancy. This device can also be used to treat heavy bleeding that occurs during your period. This medicine may be used for other purposes; ask your health care provider or pharmacist if you have questions. COMMON BRAND NAME(S): Kyleena, LILETTA, Mirena, Skyla What should I tell my health care provider before I take this medicine? They need to know if you have any of these conditions:  abnormal Pap smear  cancer of the breast, uterus, or cervix  diabetes  endometritis  genital or pelvic infection now or in the past  have more than one sexual partner or your partner has more than one partner  heart disease  history of an ectopic or tubal pregnancy  immune system problems  IUD in place  liver disease or tumor  problems with blood clots or take blood-thinners  seizures  use intravenous drugs  uterus of unusual shape  vaginal bleeding that has not been explained  an unusual or allergic reaction to levonorgestrel, other hormones, silicone, or polyethylene, medicines, foods, dyes, or preservatives  pregnant or trying to get pregnant  breast-feeding How should I use this medicine? This device is placed inside the uterus by a health care professional. Talk to your pediatrician regarding the use of this medicine in children. Special care may be needed. Overdosage: If you think you have taken too much of this medicine contact a poison control center or emergency room at once. NOTE: This medicine is only for you. Do not share this medicine with others. What if I miss a dose? This does not apply. Depending on the brand of device you have inserted, the device will need to be replaced every 3 to 6 years if you wish to continue using this type  of birth control. What may interact with this medicine? Do not take this medicine with any of the following medications:  amprenavir  bosentan  fosamprenavir This medicine may also interact with the following medications:  aprepitant  armodafinil  barbiturate medicines for inducing sleep or treating seizures  bexarotene  boceprevir  griseofulvin  medicines to treat seizures like carbamazepine, ethotoin, felbamate, oxcarbazepine, phenytoin, topiramate  modafinil  pioglitazone  rifabutin  rifampin  rifapentine  some medicines to treat HIV infection like atazanavir, efavirenz, indinavir, lopinavir, nelfinavir, tipranavir, ritonavir  St. John's wort  warfarin This list may not describe all possible interactions. Give your health care provider a list of all the medicines, herbs, non-prescription drugs, or dietary supplements you use. Also tell them if you smoke, drink alcohol, or use illegal drugs. Some items may interact with your medicine. What should I watch for while using this medicine? Visit your doctor or health care professional for regular check ups. See your doctor if you or your partner has sexual contact with others, becomes HIV positive, or gets a sexual transmitted disease. This product does not protect you against HIV infection (AIDS) or other sexually transmitted diseases. You can check the placement of the IUD yourself by reaching up to the top of your vagina with clean fingers to feel the threads. Do not pull on the threads. It is a good habit to check placement after each menstrual period. Call your doctor right away if you feel more of the IUD than just the threads or if you cannot feel the threads at   all. The IUD may come out by itself. You may become pregnant if the device comes out. If you notice that the IUD has come out use a backup birth control method like condoms and call your health care provider. Using tampons will not change the position of the  IUD and are okay to use during your period. This IUD can be safely scanned with magnetic resonance imaging (MRI) only under specific conditions. Before you have an MRI, tell your healthcare provider that you have an IUD in place, and which type of IUD you have in place. What side effects may I notice from receiving this medicine? Side effects that you should report to your doctor or health care professional as soon as possible:  allergic reactions like skin rash, itching or hives, swelling of the face, lips, or tongue  fever, flu-like symptoms  genital sores  high blood pressure  no menstrual period for 6 weeks during use  pain, swelling, warmth in the leg  pelvic pain or tenderness  severe or sudden headache  signs of pregnancy  stomach cramping  sudden shortness of breath  trouble with balance, talking, or walking  unusual vaginal bleeding, discharge  yellowing of the eyes or skin Side effects that usually do not require medical attention (report to your doctor or health care professional if they continue or are bothersome):  acne  breast pain  change in sex drive or performance  changes in weight  cramping, dizziness, or faintness while the device is being inserted  headache  irregular menstrual bleeding within first 3 to 6 months of use  nausea This list may not describe all possible side effects. Call your doctor for medical advice about side effects. You may report side effects to FDA at 1-800-FDA-1088. Where should I keep my medicine? This does not apply. NOTE: This sheet is a summary. It may not cover all possible information. If you have questions about this medicine, talk to your doctor, pharmacist, or health care provider.  2020 Elsevier/Gold Standard (2017-12-14 13:22:01)  Endometrial Ablation Endometrial ablation is a procedure that destroys the thin inner layer of the lining of the uterus (endometrium). This procedure may be done:  To stop  heavy periods.  To stop bleeding that is causing anemia.  To control irregular bleeding.  To treat bleeding caused by small tumors (fibroids) in the endometrium. This procedure is often an alternative to major surgery, such as removal of the uterus and cervix (hysterectomy). As a result of this procedure:  You may not be able to have children. However, if you are premenopausal (you have not gone through menopause): ? You may still have a small chance of getting pregnant. ? You will need to use a reliable method of birth control after the procedure to prevent pregnancy.  You may stop having a menstrual period, or you may have only a small amount of bleeding during your period. Menstruation may return several years after the procedure. Tell a health care provider about:  Any allergies you have.  All medicines you are taking, including vitamins, herbs, eye drops, creams, and over-the-counter medicines.  Any problems you or family members have had with the use of anesthetic medicines.  Any blood disorders you have.  Any surgeries you have had.  Any medical conditions you have. What are the risks? Generally, this is a safe procedure. However, problems may occur, including:  A hole (perforation) in the uterus or bowel.  Infection of the uterus, bladder, or vagina.  Bleeding.  Damage to other structures or organs.  An air bubble in the lung (air embolus).  Problems with pregnancy after the procedure.  Failure of the procedure.  Decreased ability to diagnose cancer in the endometrium. What happens before the procedure?  You will have tests of your endometrium to make sure there are no pre-cancerous cells or cancer cells present.  You may have an ultrasound of the uterus.  You may be given medicines to thin the endometrium.  Ask your health care provider about: ? Changing or stopping your regular medicines. This is especially important if you take diabetes medicines or  blood thinners. ? Taking medicines such as aspirin and ibuprofen. These medicines can thin your blood. Do not take these medicines before your procedure if your doctor tells you not to.  Plan to have someone take you home from the hospital or clinic. What happens during the procedure?   You will lie on an exam table with your feet and legs supported as in a pelvic exam.  To lower your risk of infection: ? Your health care team will wash or sanitize their hands and put on germ-free (sterile) gloves. ? Your genital area will be washed with soap.  An IV tube will be inserted into one of your veins.  You will be given a medicine to help you relax (sedative).  A surgical instrument with a light and camera (resectoscope) will be inserted into your vagina and moved into your uterus. This allows your surgeon to see inside your uterus.  Endometrial tissue will be removed using one of the following methods: ? Radiofrequency. This method uses a radiofrequency-alternating electric current to remove the endometrium. ? Cryotherapy. This method uses extreme cold to freeze the endometrium. ? Heated-free liquid. This method uses a heated saltwater (saline) solution to remove the endometrium. ? Microwave. This method uses high-energy microwaves to heat up the endometrium and remove it. ? Thermal balloon. This method involves inserting a catheter with a balloon tip into the uterus. The balloon tip is filled with heated fluid to remove the endometrium. The procedure may vary among health care providers and hospitals. What happens after the procedure?  Your blood pressure, heart rate, breathing rate, and blood oxygen level will be monitored until the medicines you were given have worn off.  As tissue healing occurs, you may notice vaginal bleeding for 4-6 weeks after the procedure. You may also experience: ? Cramps. ? Thin, watery vaginal discharge that is light pink or brown in color. ? A need to  urinate more frequently than usual. ? Nausea.  Do not drive for 24 hours if you were given a sedative.  Do not have sex or insert anything into your vagina until your health care provider approves. Summary  Endometrial ablation is done to treat the many causes of heavy menstrual bleeding.  The procedure may be done only after medications have been tried to control the bleeding.  Plan to have someone take you home from the hospital or clinic. This information is not intended to replace advice given to you by your health care provider. Make sure you discuss any questions you have with your health care provider. Document Released: 12/13/2003 Document Revised: 07/20/2017 Document Reviewed: 02/20/2016 Elsevier Patient Education  2020 [employer].
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
## 2018-10-17 NOTE — Telephone Encounter (Signed)
Returned patient's call, she is going into a Dr's appt. And requested I call back in the am

## 2018-10-18 ENCOUNTER — Telehealth: Payer: Self-pay | Admitting: Gastroenterology

## 2018-10-18 ENCOUNTER — Telehealth: Payer: Self-pay

## 2018-10-18 NOTE — Telephone Encounter (Signed)
I responded to your other message - may be best to see her in clinic again. Thanks

## 2018-10-18 NOTE — Telephone Encounter (Signed)
Sherlynn Stalls can you please help clarify, I am just seeing this now. Can she currently swallow liquids / tolerate her secretions and other foods? If she cannot tolerate anything PO or her secretions that is a food impaction and she will need to go to the ED for management. If she only has just dysphagia to steak only and tolerating PO otherwise, that is a different situation. She has had a prior EGD remotely and they empirically dilated her I think in 2017. If she had benefit from that in the past we can repeat an EGD with dilation if you can schedule her. She's had a negative barium study and manometry recently, not sure what is driving this. I may have an opening Friday if she is interested.  Can you let me know either way, thanks.

## 2018-10-18 NOTE — Telephone Encounter (Signed)
Called patient back and received her voice mail; left message that Dr. Havery Braun would like to do an EGD with dilation if she is in agreement and feels it would help. To please call us back.

## 2018-10-18 NOTE — Telephone Encounter (Signed)
Thanks Customer service manager. If she has dysphagia and had benefit from her prior dilation we can proceed with that. If she did not have benefit from it previously, or has more so globus and not true dysphagia, would be better to see her in clinic first. Thanks much

## 2018-10-18 NOTE — Telephone Encounter (Signed)
It didn't sound like an impaction when I spoke to her, but I called back to clarify. Patient states she is swallowing liquid and food, but is" having symptoms like she has before" where she has indigestion a lot of times after eating or drinking and frequently also has reflux. "feels like a stick in her throat"

## 2018-10-18 NOTE — Telephone Encounter (Signed)
Patient called and states since Sat. night, when she tried to eat steak, she feels like something is stuck in her throat. This is the first time she tried steak in a while and she cut it into very small bites. She has also had worse reflux since Sat. when she lays down. She is taking her Protonix and Carafate.

## 2018-10-18 NOTE — Telephone Encounter (Signed)
Pt called stating that she was returning your call, pls call her again.

## 2018-10-19 NOTE — Telephone Encounter (Signed)
Patient does not feel that the EGD with dilation helped much, so she wanted to just schedule an office visit to discuss options. Scheduled for 11/24/18 with Dr. Havery Moros

## 2018-10-21 ENCOUNTER — Ambulatory Visit (INDEPENDENT_AMBULATORY_CARE_PROVIDER_SITE_OTHER): Payer: Medicare HMO | Admitting: Psychiatry

## 2018-10-21 ENCOUNTER — Other Ambulatory Visit: Payer: Self-pay

## 2018-10-21 DIAGNOSIS — G4700 Insomnia, unspecified: Secondary | ICD-10-CM | POA: Diagnosis not present

## 2018-10-21 DIAGNOSIS — F3161 Bipolar disorder, current episode mixed, mild: Secondary | ICD-10-CM | POA: Diagnosis not present

## 2018-10-21 DIAGNOSIS — F4312 Post-traumatic stress disorder, chronic: Secondary | ICD-10-CM | POA: Diagnosis not present

## 2018-10-21 MED ORDER — CLONAZEPAM 1 MG PO TABS: 1.0000 mg | ORAL_TABLET | Freq: Three times a day (TID) | ORAL | 1 refills | Status: DC | PRN

## 2018-10-21 MED ORDER — OLANZAPINE 15 MG PO TABS: 15.0000 mg | ORAL_TABLET | Freq: Every day | ORAL | 1 refills | Status: DC

## 2018-10-21 MED ORDER — TRAZODONE HCL 150 MG PO TABS: 150.0000 mg | ORAL_TABLET | Freq: Every evening | ORAL | 1 refills | Status: DC | PRN

## 2018-10-21 MED ORDER — OXCARBAZEPINE 300 MG PO TABS: 300.0000 mg | ORAL_TABLET | Freq: Two times a day (BID) | ORAL | 1 refills | Status: DC
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
## 2018-10-31 DIAGNOSIS — Z79899 Other long term (current) drug therapy: Secondary | ICD-10-CM | POA: Diagnosis not present

## 2018-10-31 DIAGNOSIS — I10 Essential (primary) hypertension: Secondary | ICD-10-CM | POA: Diagnosis not present

## 2018-10-31 DIAGNOSIS — R635 Abnormal weight gain: Secondary | ICD-10-CM | POA: Diagnosis not present

## 2018-10-31 DIAGNOSIS — E7800 Pure hypercholesterolemia, unspecified: Secondary | ICD-10-CM | POA: Diagnosis not present

## 2018-10-31 DIAGNOSIS — R6889 Other general symptoms and signs: Secondary | ICD-10-CM | POA: Diagnosis not present

## 2018-10-31 DIAGNOSIS — R5383 Other fatigue: Secondary | ICD-10-CM | POA: Diagnosis not present

## 2018-10-31 DIAGNOSIS — Z1159 Encounter for screening for other viral diseases: Secondary | ICD-10-CM | POA: Diagnosis not present

## 2018-11-17 ENCOUNTER — Other Ambulatory Visit: Payer: Self-pay | Admitting: Family Medicine

## 2018-11-17 DIAGNOSIS — Z8719 Personal history of other diseases of the digestive system: Secondary | ICD-10-CM

## 2018-11-21 ENCOUNTER — Other Ambulatory Visit (HOSPITAL_COMMUNITY): Payer: Self-pay | Admitting: Psychiatry

## 2018-11-24 ENCOUNTER — Ambulatory Visit: Payer: Medicare HMO | Admitting: Gastroenterology
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
## 2018-11-25 ENCOUNTER — Encounter: Payer: Self-pay | Admitting: *Deleted

## 2018-11-28 ENCOUNTER — Other Ambulatory Visit: Payer: Self-pay

## 2018-11-28 ENCOUNTER — Ambulatory Visit (INDEPENDENT_AMBULATORY_CARE_PROVIDER_SITE_OTHER): Payer: Medicare HMO | Admitting: Gastroenterology

## 2018-11-28 ENCOUNTER — Other Ambulatory Visit (INDEPENDENT_AMBULATORY_CARE_PROVIDER_SITE_OTHER): Payer: Medicare HMO

## 2018-11-28 ENCOUNTER — Encounter: Payer: Self-pay | Admitting: Gastroenterology

## 2018-11-28 VITALS — BP 100/70 | HR 104 | Temp 97.5°F | Ht 63.5 in | Wt 192.2 lb

## 2018-11-28 DIAGNOSIS — K219 Gastro-esophageal reflux disease without esophagitis: Secondary | ICD-10-CM | POA: Diagnosis not present

## 2018-11-28 DIAGNOSIS — K760 Fatty (change of) liver, not elsewhere classified: Secondary | ICD-10-CM

## 2018-11-28 DIAGNOSIS — R7401 Elevation of levels of liver transaminase levels: Secondary | ICD-10-CM | POA: Diagnosis not present

## 2018-11-28 DIAGNOSIS — R1013 Epigastric pain: Secondary | ICD-10-CM

## 2018-11-28 DIAGNOSIS — R1310 Dysphagia, unspecified: Secondary | ICD-10-CM

## 2018-11-28 LAB — HEPATIC FUNCTION PANEL
ALT: 20 U/L (ref 0–35)
AST: 16 U/L (ref 0–37)
Albumin: 4.5 g/dL (ref 3.5–5.2)
Alkaline Phosphatase: 69 U/L (ref 39–117)
Bilirubin, Direct: 0 mg/dL (ref 0.0–0.3)
Total Bilirubin: 0.3 mg/dL (ref 0.2–1.2)
Total Protein: 7.1 g/dL (ref 6.0–8.3)

## 2018-11-28 LAB — IBC + FERRITIN
Ferritin: 19.9 ng/mL (ref 10.0–291.0)
Iron: 54 ug/dL (ref 42–145)
Saturation Ratios: 12.4 % — ABNORMAL LOW (ref 20.0–50.0)
Transferrin: 312 mg/dL (ref 212.0–360.0)

## 2018-11-28 MED ORDER — DEXILANT 60 MG PO CPDR: 60.0000 mg | DELAYED_RELEASE_CAPSULE | Freq: Every day | ORAL | 0 refills | Status: DC

## 2018-11-28 MED ORDER — SUCRALFATE 1 GM/10ML PO SUSP: ORAL | 2 refills | Status: DC
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
## 2018-11-28 NOTE — Patient Instructions (Addendum)
If you are age 44 or older, your body mass index should be between 23-30. Your Body mass index is 33.52 kg/m. If this is out of the aforementioned range listed, please consider follow up with your Primary Care Provider.  If you are age 44 or younger, your body mass index should be between 19-25. Your Body mass index is 33.52 kg/m. If this is out of the aformentioned range listed, please consider follow up with your Primary Care Provider.   To help prevent the possible spread of infection to our patients, communities, and staff; we will be implementing the following measures:  As of now we are not allowing any visitors/family members to accompany you to any upcoming appointments with [facility]. If you have any concerns about this please contact our office to discuss prior to the appointment.   Please go to the lab in the basement of our building to have lab work done as you leave today. Hit "B" for basement when you get on the elevator.  When the doors open the lab is on your left.  We will call you with the results. Thank you.   You have been scheduled for a HIDA scan at [facility] Radiology (1st floor) on . Please arrive 30 minutes prior to your scheduled appointment at  Wednesday, 12-14-18 at [id number]. Make certain not to have anything to eat or drink at least 6 hours prior to your test. Should this appointment date or time not work well for you, please call radiology scheduling at [phone].  _____________________________________________________________________  hepatobiliary (HIDA) scan is an imaging procedure used to diagnose problems in the liver, gallbladder and bile ducts. In the HIDA scan, a radioactive chemical or tracer is injected into a vein in your arm. The tracer is handled by the liver like bile. Bile is a fluid produced and excreted by your liver that helps your digestive system break down fats in the foods you eat. Bile is stored in your gallbladder and the  gallbladder releases the bile when you eat a meal. A special nuclear medicine scanner (gamma camera) tracks the flow of the tracer from your liver into your gallbladder and small intestine.  During your HIDA scan  You'll be asked to change into a hospital gown before your HIDA scan begins. Your health care team will position you on a table, usually on your back. The radioactive tracer is then injected into a vein in your arm.The tracer travels through your bloodstream to your liver, where it's taken up by the bile-producing cells. The radioactive tracer travels with the bile from your liver into your gallbladder and through your bile ducts to your small intestine.You may feel some pressure while the radioactive tracer is injected into your vein. As you lie on the table, a special gamma camera is positioned over your abdomen taking pictures of the tracer as it moves through your body. The gamma camera takes pictures continually for about an hour. You'll need to keep still during the HIDA scan. This can become uncomfortable, but you may find that you can lessen the discomfort by taking deep breaths and thinking about other things. Tell your health care team if you're uncomfortable. The radiologist will watch on a computer the progress of the radioactive tracer through your body. The HIDA scan may be stopped when the radioactive tracer is seen in the gallbladder and enters your small intestine. This typically takes about an hour. In some cases extra imaging will be performed if original images aren't  satisfactory, if morphine is given to help visualize the gallbladder or if the medication CCK is given to look at the contraction of the gallbladder. This test typically takes 2 hours to complete. ____________________________________________________  Dennis Bast have been scheduled for a modified barium swallow on _________ at _______. Please arrive 15 minutes prior to your test for registration. You will go to [facility]  Radiology (1st Floor) for your appointment. Should you need to cancel or reschedule your appointment, please contact [phone]). _____________________________________________________________________   A Modified Barium Swallow Study, or MBS, is a special x-ray that is taken to check swallowing skills. It is carried out by a Stage manager and a Psychologist, clinical (SLP). During this test, yourmouth, throat, and esophagus, a muscular tube which connects your mouth to your stomach, is checked. The test will help you, your doctor, and the SLP plan what types of foods and liquids are easier for you to swallow. The SLP will also identify positions and ways to help you swallow more easily and safely. What will happen during an MBS? You will be taken to an x-ray room and seated comfortably. You will be asked to swallow small amounts of food and liquid mixed with barium. Barium is a liquid or paste that allows images of your mouth, throat and esophagus to be seen on x-ray. The x-ray captures moving images of the food you are swallowing as it travels from your mouth through your throat and into your esophagus. This test helps identify whether food or liquid is entering your lungs (aspiration). The test also shows which part of your mouth or throat lacks strength or coordination to move the food or liquid in the right direction. This test typically takes 30 minutes to 1 hour to complete. _______________________________________________________________________  Discontinue Protonix.  Start Dexilant 60mg  once daily.  We are giving you samples today.  We have sent the following medications to your pharmacy for you to pick up at your convenience: Liquid Carafate: Take 72ml every 6 hours as needed   Thank you for entrusting me with your care and for choosing [facility], Dr. Brookview Cellar

## 2018-11-29 ENCOUNTER — Ambulatory Visit: Payer: Medicare HMO | Admitting: Obstetrics and Gynecology

## 2018-11-29 DIAGNOSIS — Z30017 Encounter for initial prescription of implantable subdermal contraceptive: Secondary | ICD-10-CM

## 2018-11-29 LAB — H. PYLORI ANTIBODY, IGG: H Pylori IgG: POSITIVE — AB

## 2018-11-30 ENCOUNTER — Telehealth: Payer: Self-pay

## 2018-11-30 ENCOUNTER — Other Ambulatory Visit (HOSPITAL_COMMUNITY): Payer: Self-pay | Admitting: *Deleted

## 2018-11-30 DIAGNOSIS — R1310 Dysphagia, unspecified: Secondary | ICD-10-CM

## 2018-11-30 NOTE — Telephone Encounter (Signed)
Called pt after hearing back from speech path.  She is scheduled at [facility] on October 22nd at 1:00pm, 1st floor radiology.  Patient instructed to arrive 15 mins prior. She expressed understanding.

## 2018-12-01 DIAGNOSIS — R635 Abnormal weight gain: Secondary | ICD-10-CM | POA: Diagnosis not present

## 2018-12-01 DIAGNOSIS — Z9851 Tubal ligation status: Secondary | ICD-10-CM | POA: Diagnosis not present

## 2018-12-01 DIAGNOSIS — Z23 Encounter for immunization: Secondary | ICD-10-CM | POA: Diagnosis not present

## 2018-12-01 LAB — ANTI-SMOOTH MUSCLE ANTIBODY, IGG: Actin (Smooth Muscle) Antibody (IGG): <20 U (ref <20)

## 2018-12-01 LAB — ANA: Anti Nuclear Antibody (ANA): NEGATIVE

## 2018-12-01 LAB — HEPATITIS A ANTIBODY, TOTAL: Hepatitis A AB,Total: NONREACTIVE

## 2018-12-01 LAB — ALPHA-1-ANTITRYPSIN: A-1 Antitrypsin, Ser: 143 mg/dL (ref 83–199)

## 2018-12-01 LAB — TISSUE TRANSGLUTAMINASE, IGA: (tTG) Ab, IgA: 1 U/mL

## 2018-12-01 LAB — HEPATITIS C ANTIBODY
Hepatitis C Ab: NONREACTIVE
SIGNAL TO CUT-OFF: 0.01 (ref <1.00)

## 2018-12-01 LAB — HEPATITIS B SURFACE ANTIGEN: Hepatitis B Surface Ag: NONREACTIVE

## 2018-12-01 LAB — HEPATITIS B SURFACE ANTIBODY,QUALITATIVE: Hep B S Ab: NONREACTIVE

## 2018-12-01 LAB — CERULOPLASMIN: Ceruloplasmin: 32 mg/dL (ref 18–53)

## 2018-12-01 SURGERY — Surgical Case
Anesthesia: *Unknown

## 2018-12-06 ENCOUNTER — Other Ambulatory Visit: Payer: Self-pay

## 2018-12-06 MED ORDER — PYLERA 140-125-125 MG PO CAPS: 3.0000 | ORAL_CAPSULE | Freq: Three times a day (TID) | ORAL | 0 refills | Status: DC

## 2018-12-08 ENCOUNTER — Ambulatory Visit (HOSPITAL_COMMUNITY)
Admission: RE | Admit: 2018-12-08 | Discharge: 2018-12-08 | Disposition: A | Discharge status: Home / Self Care | Payer: Medicare HMO | Source: Ambulatory Visit | Attending: Gastroenterology | Admitting: Gastroenterology

## 2018-12-08 ENCOUNTER — Other Ambulatory Visit: Payer: Self-pay

## 2018-12-08 DIAGNOSIS — R1310 Dysphagia, unspecified: Secondary | ICD-10-CM | POA: Diagnosis not present

## 2018-12-13 ENCOUNTER — Ambulatory Visit (INDEPENDENT_AMBULATORY_CARE_PROVIDER_SITE_OTHER): Payer: Medicare HMO | Admitting: Neurology

## 2018-12-13 ENCOUNTER — Telehealth: Payer: Self-pay | Admitting: Obstetrics and Gynecology

## 2018-12-13 ENCOUNTER — Other Ambulatory Visit: Payer: Self-pay

## 2018-12-13 VITALS — Temp 97.7°F

## 2018-12-13 DIAGNOSIS — G43711 Chronic migraine without aura, intractable, with status migrainosus: Secondary | ICD-10-CM | POA: Diagnosis not present

## 2018-12-13 MED ORDER — RIZATRIPTAN BENZOATE 10 MG PO TBDP: 10.0000 mg | ORAL_TABLET | ORAL | 11 refills | Status: DC | PRN

## 2018-12-13 MED ORDER — ONDANSETRON 4 MG PO TBDP: 4.0000 mg | ORAL_TABLET | Freq: Three times a day (TID) | ORAL | 3 refills | Status: DC | PRN

## 2018-12-13 NOTE — Patient Instructions (Signed)
Rizatriptan(Maxalt): Please take one tablet at the onset of your headache. If it does not improve the symptoms please take one additional tablet. Do not take more then 2 tablets in 24hrs. Do not take use more then 2 to 3 times in a week. Ondansetron: for nausea and migraine. Take with Rizatriptan for migraine or alone for nausea.  Ondansetron tablets What is this medicine? ONDANSETRON (on DAN se tron) is used to treat nausea and vomiting caused by chemotherapy. It is also used to prevent or treat nausea and vomiting after surgery. This medicine may be used for other purposes; ask your health care provider or pharmacist if you have questions. COMMON BRAND NAME(S): Zofran What should I tell my health care provider before I take this medicine? They need to know if you have any of these conditions:  heart disease  history of irregular heartbeat  liver disease  low levels of magnesium or potassium in the blood  an unusual or allergic reaction to ondansetron, granisetron, other medicines, foods, dyes, or preservatives  pregnant or trying to get pregnant  breast-feeding How should I use this medicine? Take this medicine by mouth with a glass of water. Follow the directions on your prescription label. Take your doses at regular intervals. Do not take your medicine more often than directed. Talk to your pediatrician regarding the use of this medicine in children. Special care may be needed. Overdosage: If you think you have taken too much of this medicine contact a poison control center or emergency room at once. NOTE: This medicine is only for you. Do not share this medicine with others. What if I miss a dose? If you miss a dose, take it as soon as you can. If it is almost time for your next dose, take only that dose. Do not take double or extra doses. What may interact with this medicine? Do not take this medicine with any of the following medications:  apomorphine  certain medicines for  fungal infections like fluconazole, itraconazole, ketoconazole, posaconazole, voriconazole  cisapride  dronedarone  pimozide  thioridazine This medicine may also interact with the following medications:  carbamazepine  certain medicines for depression, anxiety, or psychotic disturbances  fentanyl  linezolid  MAOIs like Carbex, Eldepryl, Marplan, Nardil, and Parnate  methylene blue (injected into a vein)  other medicines that prolong the QT interval (cause an abnormal heart rhythm) like dofetilide, ziprasidone  phenytoin  rifampicin  tramadol This list may not describe all possible interactions. Give your health care provider a list of all the medicines, herbs, non-prescription drugs, or dietary supplements you use. Also tell them if you smoke, drink alcohol, or use illegal drugs. Some items may interact with your medicine. What should I watch for while using this medicine? Check with your doctor or health care professional right away if you have any sign of an allergic reaction. What side effects may I notice from receiving this medicine? Side effects that you should report to your doctor or health care professional as soon as possible:  allergic reactions like skin rash, itching or hives, swelling of the face, lips or tongue  breathing problems  confusion  dizziness  fast or irregular heartbeat  feeling faint or lightheaded, falls  fever and chills  loss of balance or coordination  seizures  sweating  swelling of the hands or feet  tightness in the chest  tremors  unusually weak or tired Side effects that usually do not require medical attention (report to your doctor or health  care professional if they continue or are bothersome):  constipation or diarrhea  headache This list may not describe all possible side effects. Call your doctor for medical advice about side effects. You may report side effects to FDA at 1-800-FDA-1088. Where should I keep  my medicine? Keep out of the reach of children. Store between 2 and 30 degrees C (36 and 86 degrees F). Throw away any unused medicine after the expiration date. NOTE: This sheet is a summary. It may not cover all possible information. If you have questions about this medicine, talk to your doctor, pharmacist, or health care provider.  2020 Elsevier/Gold Standard (2018-01-25 07:16:43) Rizatriptan tablets What is this medicine? RIZATRIPTAN (rye za TRIP tan) is used to treat migraines with or without aura. An aura is a strange feeling or visual disturbance that warns you of an attack. It is not used to prevent migraines. This medicine may be used for other purposes; ask your health care provider or pharmacist if you have questions. COMMON BRAND NAME(S): Maxalt What should I tell my health care provider before I take this medicine? They need to know if you have any of these conditions:  cigarette smoker  circulation problems in fingers and toes  diabetes  heart disease  high blood pressure  high cholesterol  history of irregular heartbeat  history of stroke  kidney disease  liver disease  stomach or intestine problems  an unusual or allergic reaction to rizatriptan, other medicines, foods, dyes, or preservatives  pregnant or trying to get pregnant  breast-feeding How should I use this medicine? Take this medicine by mouth with a glass of water. Follow the directions on the prescription label. Do not take it more often than directed. Talk to your pediatrician regarding the use of this medicine in children. While this drug may be prescribed for children as young as 6 years for selected conditions, precautions do apply. Overdosage: If you think you have taken too much of this medicine contact a poison control center or emergency room at once. NOTE: This medicine is only for you. Do not share this medicine with others. What if I miss a dose? This does not apply. This medicine  is not for regular use. What may interact with this medicine? Do not take this medicine with any of the following medicines:  certain medicines for migraine headache like almotriptan, eletriptan, frovatriptan, naratriptan, rizatriptan, sumatriptan, zolmitriptan  ergot alkaloids like dihydroergotamine, ergonovine, ergotamine, methylergonovine  MAOIs like Carbex, Eldepryl, Marplan, Nardil, and Parnate This medicine may also interact with the following medications:  certain medicines for depression, anxiety, or psychotic disorders  propranolol This list may not describe all possible interactions. Give your health care provider a list of all the medicines, herbs, non-prescription drugs, or dietary supplements you use. Also tell them if you smoke, drink alcohol, or use illegal drugs. Some items may interact with your medicine. What should I watch for while using this medicine? Visit your healthcare professional for regular checks on your progress. Tell your healthcare professional if your symptoms do not start to get better or if they get worse. You may get drowsy or dizzy. Do not drive, use machinery, or do anything that needs mental alertness until you know how this medicine affects you. Do not stand up or sit up quickly, especially if you are an older patient. This reduces the risk of dizzy or fainting spells. Alcohol may interfere with the effect of this medicine. Your mouth may get dry. Chewing sugarless gum or  sucking hard candy and drinking plenty of water may help. Contact your healthcare professional if the problem does not go away or is severe. If you take migraine medicines for 10 or more days a month, your migraines may get worse. Keep a diary of headache days and medicine use. Contact your healthcare professional if your migraine attacks occur more frequently. What side effects may I notice from receiving this medicine? Side effects that you should report to your doctor or health care  professional as soon as possible:  allergic reactions like skin rash, itching or hives, swelling of the face, lips, or tongue  chest pain or chest tightness  signs and symptoms of a dangerous change in heartbeat or heart rhythm like chest pain; dizziness; fast, irregular heartbeat; palpitations; feeling faint or lightheaded; falls; breathing problems  signs and symptoms of a stroke like changes in vision; confusion; trouble speaking or understanding; severe headaches; sudden numbness or weakness of the face, arm or leg; trouble walking; dizziness; loss of balance or coordination  signs and symptoms of serotonin syndrome like irritable; confusion; diarrhea; fast or irregular heartbeat; muscle twitching; stiff muscles; trouble walking; sweating; high fever; seizures; chills; vomiting Side effects that usually do not require medical attention (report to your doctor or health care professional if they continue or are bothersome):  diarrhea  dizziness  drowsiness  dry mouth  headache  nausea, vomiting  pain, tingling, numbness in the hands or feet  stomach pain This list may not describe all possible side effects. Call your doctor for medical advice about side effects. You may report side effects to FDA at 1-800-FDA-1088. Where should I keep my medicine? Keep out of the reach of children. Store at room temperature between 15 and 30 degrees C (59 and 86 degrees F). Keep container tightly closed. Throw away any unused medicine after the expiration date. NOTE: This sheet is a summary. It may not cover all possible information. If you have questions about this medicine, talk to your doctor, pharmacist, or health care provider.  2020 Elsevier/Gold Standard (2017-08-17 14:59:59)

## 2018-12-13 NOTE — Progress Notes (Signed)
Botox- 200 units x 1 vial Lot: [id number] Expiration: 07/2021 NDC: [id number]  Bacteriostatic 0.9% Sodium Chloride- 79mL total Lot: [id number] Expiration: 08/17/2019 NDC: [id number]  Dx: [id number] B/B

## 2018-12-13 NOTE — Progress Notes (Signed)
Consent Form Botulism Toxin Injection For Chronic Migraine  Interval history 12/13/2018: Dry Needling for cervical myofascial pain syndrome and migraines at last few visits but can't get to [place] has a transportation issue. She may try again when she moves closer.  This is our third botox injection and she is doing extremely well. She can tell the botox is wearing off the last 2 weeks but improving even more.  At baseline she has  She has 15 migraine days a month and daily headaches. No she She has 20-25  headache FREE days a month which she never had before and she has 5 migraine days a month which is >50% better, she definitely is seeing a great improvement.    +all  Acutely: Try Rizatriptan and zofran. May take with ibuprofen.   Reviewed orally with patient, additionally signature is on file:  Botulism toxin has been approved by the Federal drug administration for treatment of chronic migraine. Botulism toxin does not cure chronic migraine and it may not be effective in some patients.  The administration of botulism toxin is accomplished by injecting a small amount of toxin into the muscles of the neck and head. Dosage must be titrated for each individual. Any benefits resulting from botulism toxin tend to wear off after 3 months with a repeat injection required if benefit is to be maintained. Injections are usually done every 3-4 months with maximum effect peak achieved by about 2 or 3 weeks. Botulism toxin is expensive and you should be sure of what costs you will incur resulting from the injection.  The side effects of botulism toxin use for chronic migraine may include:   -Transient, and usually mild, facial weakness with facial injections  -Transient, and usually mild, head or neck weakness with head/neck injections  -Reduction or loss of forehead facial animation due to forehead muscle weakness  -Eyelid drooping  -Dry eye  -Pain at the site of injection or bruising at the site  of injection  -Double vision  -Potential unknown long term risks  Contraindications: You should not have Botox if you are pregnant, nursing, allergic to albumin, have an infection, skin condition, or muscle weakness at the site of the injection, or have myasthenia gravis, Lambert-Eaton syndrome, or ALS.  It is also possible that as with any injection, there may be an allergic reaction or no effect from the medication. Reduced effectiveness after repeated injections is sometimes seen and rarely infection at the injection site may occur. All care will be taken to prevent these side effects. If therapy is given over a long time, atrophy and wasting in the muscle injected may occur. Occasionally the patient's become refractory to treatment because they develop antibodies to the toxin. In this event, therapy needs to be modified.  I have read the above information and consent to the administration of botulism toxin.    BOTOX PROCEDURE NOTE FOR MIGRAINE HEADACHE    Contraindications and precautions discussed with patient(above). Aseptic procedure was observed and patient tolerated procedure. Procedure performed by Dr. Georgia Dom  The condition has existed for more than 6 months, and pt does not have a diagnosis of ALS, Myasthenia Gravis or Lambert-Eaton Syndrome.  Risks and benefits of injections discussed and pt agrees to proceed with the procedure.  Written consent obtained  These injections are medically necessary. Pt  receives good benefits from these injections. These injections do not cause sedations or hallucinations which the oral therapies may cause.  Description of procedure:  The patient  was placed in a sitting position. The standard protocol was used for Botox as follows, with 5 units of Botox injected at each site:   -Procerus muscle, midline injection  -Corrugator muscle, bilateral injection  -Frontalis muscle, bilateral injection, with 2 sites each side, medial injection was  performed in the upper one third of the frontalis muscle, in the region vertical from the medial inferior edge of the superior orbital rim. The lateral injection was again in the upper one third of the forehead vertically above the lateral limbus of the cornea, 1.5 cm lateral to the medial injection site.  - Levator Scapulae: 5 units bilaterally  -Temporalis muscle injection, 5 sites, bilaterally. The first injection was 3 cm above the tragus of the ear, second injection site was 1.5 cm to 3 cm up from the first injection site in line with the tragus of the ear. The third injection site was 1.5-3 cm forward between the first 2 injection sites. The fourth injection site was 1.5 cm posterior to the second injection site. 5th site laterally in the temporalis  muscleat the level of the outer canthus.  - Patient feels her clenching is a trigger for headaches. +5 units masseter bilaterally   - Patient feels the migraines are centered around the eyes +5 units bilaterally at the outer canthus in the orbicularis occuli  -Occipitalis muscle injection, 3 sites, bilaterally. The first injection was done one half way between the occipital protuberance and the tip of the mastoid process behind the ear. The second injection site was done lateral and superior to the first, 1 fingerbreadth from the first injection. The third injection site was 1 fingerbreadth superiorly and medially from the first injection site.  -Cervical paraspinal muscle injection, 2 sites, bilateral knee first injection site was 1 cm from the midline of the cervical spine, 3 cm inferior to the lower border of the occipital protuberance. The second injection site was 1.5 cm superiorly and laterally to the first injection site.  -Trapezius muscle injection was performed at 3 sites, bilaterally. The first injection site was in the upper trapezius muscle halfway between the inflection point of the neck, and the acromion. The second injection site was  one half way between the acromion and the first injection site. The third injection was done between the first injection site and the inflection point of the neck.   Will return for repeat injection in 3 months.   A 200 unit sof Botox was used, any Botox not injected was wasted. The patient tolerated the procedure well, there were no complications of the above procedure.

## 2018-12-14 ENCOUNTER — Other Ambulatory Visit: Payer: Self-pay

## 2018-12-14 ENCOUNTER — Ambulatory Visit (HOSPITAL_COMMUNITY)
Admission: RE | Admit: 2018-12-14 | Discharge: 2018-12-14 | Disposition: A | Discharge status: Home / Self Care | Payer: Medicare HMO | Source: Ambulatory Visit | Attending: Gastroenterology | Admitting: Gastroenterology

## 2018-12-14 ENCOUNTER — Ambulatory Visit (INDEPENDENT_AMBULATORY_CARE_PROVIDER_SITE_OTHER): Payer: Medicare HMO | Admitting: Gastroenterology

## 2018-12-14 DIAGNOSIS — R1013 Epigastric pain: Secondary | ICD-10-CM | POA: Diagnosis not present

## 2018-12-14 DIAGNOSIS — Z23 Encounter for immunization: Secondary | ICD-10-CM | POA: Diagnosis not present

## 2018-12-14 MED ORDER — TECHNETIUM TC 99M MEBROFENIN IV KIT
5.3000 | PACK | Freq: Once | INTRAVENOUS | Status: AC | PRN
  Administered 2018-12-14: 5.3 via INTRAVENOUS

## 2018-12-21 ENCOUNTER — Other Ambulatory Visit: Payer: Self-pay

## 2018-12-21 ENCOUNTER — Ambulatory Visit (INDEPENDENT_AMBULATORY_CARE_PROVIDER_SITE_OTHER): Payer: Medicare HMO | Admitting: Psychiatry

## 2018-12-21 DIAGNOSIS — F4312 Post-traumatic stress disorder, chronic: Secondary | ICD-10-CM

## 2018-12-21 DIAGNOSIS — G4700 Insomnia, unspecified: Secondary | ICD-10-CM

## 2018-12-21 DIAGNOSIS — F3161 Bipolar disorder, current episode mixed, mild: Secondary | ICD-10-CM | POA: Diagnosis not present

## 2018-12-21 MED ORDER — OXCARBAZEPINE 300 MG PO TABS: 300.0000 mg | ORAL_TABLET | Freq: Two times a day (BID) | ORAL | 1 refills | Status: DC

## 2018-12-21 MED ORDER — OLANZAPINE 10 MG PO TABS: 10.0000 mg | ORAL_TABLET | Freq: Every day | ORAL | 1 refills | Status: DC

## 2018-12-21 MED ORDER — CLONAZEPAM 1 MG PO TABS: 1.0000 mg | ORAL_TABLET | Freq: Two times a day (BID) | ORAL | 1 refills | Status: DC | PRN

## 2018-12-21 MED ORDER — TRAZODONE HCL 150 MG PO TABS: 150.0000 mg | ORAL_TABLET | Freq: Every evening | ORAL | 1 refills | Status: DC | PRN
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
## 2018-12-28 DIAGNOSIS — I10 Essential (primary) hypertension: Secondary | ICD-10-CM | POA: Diagnosis not present

## 2018-12-28 DIAGNOSIS — M797 Fibromyalgia: Secondary | ICD-10-CM | POA: Diagnosis not present

## 2018-12-30 DIAGNOSIS — R635 Abnormal weight gain: Secondary | ICD-10-CM | POA: Diagnosis not present

## 2018-12-31 ENCOUNTER — Other Ambulatory Visit (HOSPITAL_COMMUNITY): Payer: Self-pay | Admitting: Psychiatry

## 2019-01-02 ENCOUNTER — Other Ambulatory Visit (HOSPITAL_COMMUNITY): Payer: Self-pay | Admitting: Psychiatry

## 2019-01-15 ENCOUNTER — Other Ambulatory Visit: Payer: Self-pay | Admitting: Gastroenterology

## 2019-01-18 ENCOUNTER — Telehealth (HOSPITAL_COMMUNITY): Payer: Self-pay | Admitting: *Deleted

## 2019-01-18 NOTE — Telephone Encounter (Signed)
Pt called c/o "issues with my mood" including endorsing AVH, feeling overwhelmed and "panicky" times approximately two weeks. Please review and advise.

## 2019-01-18 NOTE — Telephone Encounter (Signed)
Then she soul reschedule her follow up with me to earliest available (has one in early January I believe).

## 2019-01-20 ENCOUNTER — Other Ambulatory Visit: Payer: Self-pay

## 2019-01-20 ENCOUNTER — Ambulatory Visit (INDEPENDENT_AMBULATORY_CARE_PROVIDER_SITE_OTHER): Payer: Medicare HMO | Admitting: Psychiatry

## 2019-01-20 DIAGNOSIS — G4700 Insomnia, unspecified: Secondary | ICD-10-CM

## 2019-01-20 DIAGNOSIS — F3132 Bipolar disorder, current episode depressed, moderate: Secondary | ICD-10-CM

## 2019-01-20 DIAGNOSIS — F4312 Post-traumatic stress disorder, chronic: Secondary | ICD-10-CM | POA: Diagnosis not present

## 2019-01-20 MED ORDER — OLANZAPINE 15 MG PO TABS: 15.0000 mg | ORAL_TABLET | Freq: Every day | ORAL | 1 refills | Status: DC

## 2019-01-20 MED ORDER — OXCARBAZEPINE 300 MG PO TABS: 300.0000 mg | ORAL_TABLET | Freq: Two times a day (BID) | ORAL | 1 refills | Status: DC

## 2019-01-20 MED ORDER — CLONAZEPAM 1 MG PO TABS: 1.0000 mg | ORAL_TABLET | Freq: Two times a day (BID) | ORAL | 1 refills | Status: DC | PRN

## 2019-01-20 MED ORDER — TRAZODONE HCL 150 MG PO TABS: 150.0000 mg | ORAL_TABLET | Freq: Every evening | ORAL | 1 refills | Status: DC | PRN
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
## 2019-01-30 ENCOUNTER — Telehealth: Payer: Self-pay | Admitting: Gastroenterology

## 2019-01-30 DIAGNOSIS — Z8619 Personal history of other infectious and parasitic diseases: Secondary | ICD-10-CM

## 2019-01-30 DIAGNOSIS — R635 Abnormal weight gain: Secondary | ICD-10-CM | POA: Diagnosis not present

## 2019-01-30 MED ORDER — SUCRALFATE 1 GM/10ML PO SUSP: ORAL | 0 refills | Status: DC

## 2019-01-30 NOTE — Telephone Encounter (Signed)
Do you want [name] to refill patient's carafate or does she need to come back for H pylori eradication testing if still having sx?

## 2019-01-30 NOTE — Telephone Encounter (Signed)
Patient indicates that she is still having the nausea/heartburn/stomach discomfort that she was having prior to being diagnosed with H Pylori. I have advised that we need her to be retested for H Pylori to check for eradication. She is advised to hold Dexilant x 14 days prior to test and that she should hold carafate 3 days prior to test. She will come pick up stool specimen container this week for H Pylori special antigen test and will get this back to [name] after holding her medications for specified amount of time. Carafate refilled temporarily until we get test back. Patient verbalizes understanding.

## 2019-01-30 NOTE — Telephone Encounter (Signed)
Thanks Dottie. She should have H pylori eradication testing done if it hasn't been done yet. To do this she would need to stop Dexilant for 2 weeks, then submit an H pylori stool antigen test if you can order. We can refill the carafate and she can use this up to within a few days of the H pylori test. Thanks

## 2019-02-14 DIAGNOSIS — I10 Essential (primary) hypertension: Secondary | ICD-10-CM | POA: Diagnosis not present

## 2019-02-14 DIAGNOSIS — M797 Fibromyalgia: Secondary | ICD-10-CM | POA: Diagnosis not present

## 2019-02-17 DIAGNOSIS — I951 Orthostatic hypotension: Secondary | ICD-10-CM

## 2019-02-17 HISTORY — DX: Orthostatic hypotension: I95.1

## 2019-02-20 ENCOUNTER — Ambulatory Visit (HOSPITAL_COMMUNITY): Payer: Medicare HMO | Admitting: Psychiatry

## 2019-02-20 ENCOUNTER — Other Ambulatory Visit: Payer: Self-pay

## 2019-02-20 ENCOUNTER — Ambulatory Visit: Payer: Medicare HMO | Admitting: Neurology

## 2019-02-21 ENCOUNTER — Telehealth: Payer: Self-pay | Admitting: Neurology

## 2019-02-21 ENCOUNTER — Ambulatory Visit (INDEPENDENT_AMBULATORY_CARE_PROVIDER_SITE_OTHER): Payer: Medicare HMO | Admitting: Psychiatry

## 2019-02-21 ENCOUNTER — Other Ambulatory Visit: Payer: Self-pay

## 2019-02-21 ENCOUNTER — Telehealth (INDEPENDENT_AMBULATORY_CARE_PROVIDER_SITE_OTHER): Payer: Medicare HMO | Admitting: Neurology

## 2019-02-21 DIAGNOSIS — F4312 Post-traumatic stress disorder, chronic: Secondary | ICD-10-CM | POA: Diagnosis not present

## 2019-02-21 DIAGNOSIS — IMO0002 Reserved for concepts with insufficient information to code with codable children: Secondary | ICD-10-CM

## 2019-02-21 DIAGNOSIS — R9401 Abnormal electroencephalogram [EEG]: Secondary | ICD-10-CM | POA: Diagnosis not present

## 2019-02-21 DIAGNOSIS — I951 Orthostatic hypotension: Secondary | ICD-10-CM

## 2019-02-21 DIAGNOSIS — F319 Bipolar disorder, unspecified: Secondary | ICD-10-CM

## 2019-02-21 DIAGNOSIS — F419 Anxiety disorder, unspecified: Secondary | ICD-10-CM

## 2019-02-21 DIAGNOSIS — G43709 Chronic migraine without aura, not intractable, without status migrainosus: Secondary | ICD-10-CM | POA: Diagnosis not present

## 2019-02-21 DIAGNOSIS — G43711 Chronic migraine without aura, intractable, with status migrainosus: Secondary | ICD-10-CM | POA: Diagnosis not present

## 2019-02-21 DIAGNOSIS — R55 Syncope and collapse: Secondary | ICD-10-CM | POA: Diagnosis not present

## 2019-02-21 MED ORDER — OXCARBAZEPINE 600 MG PO TABS: 600.0000 mg | ORAL_TABLET | Freq: Two times a day (BID) | ORAL | 1 refills | Status: DC

## 2019-02-21 MED ORDER — FLUOXETINE HCL 20 MG PO CAPS: 20.0000 mg | ORAL_CAPSULE | Freq: Every day | ORAL | 0 refills | Status: DC
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
## 2019-02-21 NOTE — Telephone Encounter (Signed)
Called the patient to inform that Dr Brett Fairy will be out of the office and she is requesting to change the patients appt's to virtual mychart visit. There was no answer. LVM informing the patient of this. Advised the patient to call back to review with her
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
## 2019-02-28 ENCOUNTER — Ambulatory Visit: Payer: Medicare HMO | Admitting: Adult Health

## 2019-03-06 ENCOUNTER — Encounter: Payer: Self-pay | Admitting: Neurology

## 2019-03-16 ENCOUNTER — Other Ambulatory Visit: Payer: Self-pay

## 2019-03-16 ENCOUNTER — Ambulatory Visit: Payer: Medicare HMO | Admitting: Neurology

## 2019-03-16 VITALS — Temp 96.9°F

## 2019-03-16 DIAGNOSIS — R29898 Other symptoms and signs involving the musculoskeletal system: Secondary | ICD-10-CM

## 2019-03-16 DIAGNOSIS — R200 Anesthesia of skin: Secondary | ICD-10-CM | POA: Diagnosis not present

## 2019-03-16 DIAGNOSIS — G43711 Chronic migraine without aura, intractable, with status migrainosus: Secondary | ICD-10-CM

## 2019-03-16 DIAGNOSIS — R270 Ataxia, unspecified: Secondary | ICD-10-CM

## 2019-03-16 DIAGNOSIS — R202 Paresthesia of skin: Secondary | ICD-10-CM | POA: Diagnosis not present

## 2019-03-16 DIAGNOSIS — G959 Disease of spinal cord, unspecified: Secondary | ICD-10-CM

## 2019-03-16 DIAGNOSIS — M79604 Pain in right leg: Secondary | ICD-10-CM

## 2019-03-16 DIAGNOSIS — M79605 Pain in left leg: Secondary | ICD-10-CM

## 2019-03-16 MED ORDER — GABAPENTIN 400 MG PO CAPS: 800.0000 mg | ORAL_CAPSULE | Freq: Three times a day (TID) | ORAL | 4 refills | Status: DC

## 2019-03-16 NOTE — Addendum Note (Signed)
Addended by: Sarina Ill B on: 03/16/2019 03:50 PM   Modules accepted: Orders, Level of Service
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
## 2019-03-16 NOTE — Progress Notes (Signed)
Botox- 200 units x 1 vial Lot: [id number] Expiration: 11/2021 NDC: 0023-3921-02  Bacteriostatic 0.9% Sodium Chloride- 4mL total Lot: [id number] Expiration: 05/18/2019 NDC: 0409-1966-02  Dx: G43.711 B/B   

## 2019-03-16 NOTE — Progress Notes (Signed)
Consent Form Botulism Toxin Injection For Chronic Migraine  Interval history 03/16/2019: Dry Needling for cervical myofascial pain syndrome and migraines at last few visits but can't get to [place] has a transportation issues, we can refer if she wants to go in the future. She may try again when she moves closer.  This is our 4th botox injection and she is still doing extremely well. She can tell the botox is wearing off the last 2 weeks but improving even more at each injection.  At baseline she has 15 migraine days a month and daily headaches. She still has 20-25  headache FREE days a month which she never had before and she has 5 migraine days a month which is >75% better migraine frequency as well as severity, she definitely is seeing a great improvement.    +all  Acutely: Try Rizatriptan and zofran. May take with ibuprofen.   Reviewed orally with patient, additionally signature is on file:  Botulism toxin has been approved by the Federal drug administration for treatment of chronic migraine. Botulism toxin does not cure chronic migraine and it may not be effective in some patients.  The administration of botulism toxin is accomplished by injecting a small amount of toxin into the muscles of the neck and head. Dosage must be titrated for each individual. Any benefits resulting from botulism toxin tend to wear off after 3 months with a repeat injection required if benefit is to be maintained. Injections are usually done every 3-4 months with maximum effect peak achieved by about 2 or 3 weeks. Botulism toxin is expensive and you should be sure of what costs you will incur resulting from the injection.  The side effects of botulism toxin use for chronic migraine may include:   -Transient, and usually mild, facial weakness with facial injections  -Transient, and usually mild, head or neck weakness with head/neck injections  -Reduction or loss of forehead facial animation due to forehead  muscle weakness  -Eyelid drooping  -Dry eye  -Pain at the site of injection or bruising at the site of injection  -Double vision  -Potential unknown long term risks  Contraindications: You should not have Botox if you are pregnant, nursing, allergic to albumin, have an infection, skin condition, or muscle weakness at the site of the injection, or have myasthenia gravis, Lambert-Eaton syndrome, or ALS.  It is also possible that as with any injection, there may be an allergic reaction or no effect from the medication. Reduced effectiveness after repeated injections is sometimes seen and rarely infection at the injection site may occur. All care will be taken to prevent these side effects. If therapy is given over a long time, atrophy and wasting in the muscle injected may occur. Occasionally the patient's become refractory to treatment because they develop antibodies to the toxin. In this event, therapy needs to be modified.  I have read the above information and consent to the administration of botulism toxin.    BOTOX PROCEDURE NOTE FOR MIGRAINE HEADACHE    Contraindications and precautions discussed with patient(above). Aseptic procedure was observed and patient tolerated procedure. Procedure performed by Dr. Georgia Dom  The condition has existed for more than 6 months, and pt does not have a diagnosis of ALS, Myasthenia Gravis or Lambert-Eaton Syndrome.  Risks and benefits of injections discussed and pt agrees to proceed with the procedure.  Written consent obtained  These injections are medically necessary. Pt  receives good benefits from these injections. These injections do not  cause sedations or hallucinations which the oral therapies may cause.  Description of procedure:  The patient was placed in a sitting position. The standard protocol was used for Botox as follows, with 5 units of Botox injected at each site:   -Procerus muscle, midline injection  -Corrugator muscle,  bilateral injection  -Frontalis muscle, bilateral injection, with 2 sites each side, medial injection was performed in the upper one third of the frontalis muscle, in the region vertical from the medial inferior edge of the superior orbital rim. The lateral injection was again in the upper one third of the forehead vertically above the lateral limbus of the cornea, 1.5 cm lateral to the medial injection site.  - Levator Scapulae: 5 units bilaterally  -Temporalis muscle injection, 5 sites, bilaterally. The first injection was 3 cm above the tragus of the ear, second injection site was 1.5 cm to 3 cm up from the first injection site in line with the tragus of the ear. The third injection site was 1.5-3 cm forward between the first 2 injection sites. The fourth injection site was 1.5 cm posterior to the second injection site. 5th site laterally in the temporalis  muscleat the level of the outer canthus.  - Patient feels her clenching is a trigger for headaches. +5 units masseter bilaterally   - Patient feels the migraines are centered around the eyes +5 units bilaterally at the outer canthus in the orbicularis occuli  -Occipitalis muscle injection, 3 sites, bilaterally. The first injection was done one half way between the occipital protuberance and the tip of the mastoid process behind the ear. The second injection site was done lateral and superior to the first, 1 fingerbreadth from the first injection. The third injection site was 1 fingerbreadth superiorly and medially from the first injection site.  -Cervical paraspinal muscle injection, 2 sites, bilateral knee first injection site was 1 cm from the midline of the cervical spine, 3 cm inferior to the lower border of the occipital protuberance. The second injection site was 1.5 cm superiorly and laterally to the first injection site.  -Trapezius muscle injection was performed at 3 sites, bilaterally. The first injection site was in the upper  trapezius muscle halfway between the inflection point of the neck, and the acromion. The second injection site was one half way between the acromion and the first injection site. The third injection was done between the first injection site and the inflection point of the neck.   Will return for repeat injection in 3 months.   A 200 unit sof Botox was used, any Botox not injected was wasted. The patient tolerated the procedure well, there were no complications of the above procedure.

## 2019-03-23 ENCOUNTER — Ambulatory Visit (INDEPENDENT_AMBULATORY_CARE_PROVIDER_SITE_OTHER): Payer: Medicare HMO | Admitting: Psychiatry

## 2019-03-23 ENCOUNTER — Other Ambulatory Visit: Payer: Self-pay

## 2019-03-23 DIAGNOSIS — G4700 Insomnia, unspecified: Secondary | ICD-10-CM

## 2019-03-23 DIAGNOSIS — F4312 Post-traumatic stress disorder, chronic: Secondary | ICD-10-CM | POA: Diagnosis not present

## 2019-03-23 DIAGNOSIS — F3132 Bipolar disorder, current episode depressed, moderate: Secondary | ICD-10-CM | POA: Diagnosis not present

## 2019-03-23 MED ORDER — FLUOXETINE HCL 40 MG PO CAPS: 40.0000 mg | ORAL_CAPSULE | Freq: Every day | ORAL | 0 refills | Status: DC

## 2019-03-23 MED ORDER — TRAZODONE HCL 150 MG PO TABS: 150.0000 mg | ORAL_TABLET | Freq: Every evening | ORAL | 1 refills | Status: DC | PRN

## 2019-03-23 MED ORDER — OLANZAPINE 15 MG PO TABS: 15.0000 mg | ORAL_TABLET | Freq: Every day | ORAL | 1 refills | Status: DC

## 2019-03-23 MED ORDER — CLONAZEPAM 1 MG PO TABS: 1.0000 mg | ORAL_TABLET | Freq: Two times a day (BID) | ORAL | 1 refills | Status: DC | PRN
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
## 2019-03-28 ENCOUNTER — Telehealth: Payer: Self-pay | Admitting: Neurology

## 2019-03-28 NOTE — Telephone Encounter (Signed)
[employer] Auth: [id number] (exp. 03/28/19 to 04/27/19) order sent to GI. They will reach out to the patient to schedule.

## 2019-03-30 ENCOUNTER — Encounter: Payer: Self-pay | Admitting: *Deleted

## 2019-03-31 DIAGNOSIS — I10 Essential (primary) hypertension: Secondary | ICD-10-CM | POA: Diagnosis not present

## 2019-03-31 DIAGNOSIS — M797 Fibromyalgia: Secondary | ICD-10-CM | POA: Diagnosis not present

## 2019-03-31 DIAGNOSIS — Z79899 Other long term (current) drug therapy: Secondary | ICD-10-CM | POA: Diagnosis not present

## 2019-03-31 DIAGNOSIS — Z1159 Encounter for screening for other viral diseases: Secondary | ICD-10-CM | POA: Diagnosis not present

## 2019-03-31 DIAGNOSIS — E7800 Pure hypercholesterolemia, unspecified: Secondary | ICD-10-CM | POA: Diagnosis not present

## 2019-03-31 DIAGNOSIS — G43909 Migraine, unspecified, not intractable, without status migrainosus: Secondary | ICD-10-CM | POA: Diagnosis not present

## 2019-04-15 ENCOUNTER — Other Ambulatory Visit: Payer: Self-pay | Admitting: Neurology

## 2019-04-15 DIAGNOSIS — Z87820 Personal history of traumatic brain injury: Secondary | ICD-10-CM

## 2019-04-15 DIAGNOSIS — G40A19 Absence epileptic syndrome, intractable, without status epilepticus: Secondary | ICD-10-CM

## 2019-04-15 DIAGNOSIS — IMO0002 Reserved for concepts with insufficient information to code with codable children: Secondary | ICD-10-CM

## 2019-04-15 DIAGNOSIS — G40209 Localization-related (focal) (partial) symptomatic epilepsy and epileptic syndromes with complex partial seizures, not intractable, without status epilepticus: Secondary | ICD-10-CM

## 2019-04-15 DIAGNOSIS — G43709 Chronic migraine without aura, not intractable, without status migrainosus: Secondary | ICD-10-CM

## 2019-04-15 DIAGNOSIS — S062X4S Diffuse traumatic brain injury with loss of consciousness of 6 hours to 24 hours, sequela: Secondary | ICD-10-CM

## 2019-04-15 DIAGNOSIS — G43109 Migraine with aura, not intractable, without status migrainosus: Secondary | ICD-10-CM

## 2019-04-19 ENCOUNTER — Ambulatory Visit
Admission: RE | Admit: 2019-04-19 | Discharge: 2019-04-19 | Disposition: A | Discharge status: Home / Self Care | Payer: Medicare HMO | Source: Ambulatory Visit | Attending: Neurology | Admitting: Neurology

## 2019-04-19 ENCOUNTER — Other Ambulatory Visit (HOSPITAL_COMMUNITY): Payer: Self-pay | Admitting: Psychiatry

## 2019-04-19 DIAGNOSIS — R202 Paresthesia of skin: Secondary | ICD-10-CM | POA: Diagnosis not present

## 2019-04-19 DIAGNOSIS — R270 Ataxia, unspecified: Secondary | ICD-10-CM

## 2019-04-19 DIAGNOSIS — R29898 Other symptoms and signs involving the musculoskeletal system: Secondary | ICD-10-CM | POA: Diagnosis not present

## 2019-04-19 DIAGNOSIS — G959 Disease of spinal cord, unspecified: Secondary | ICD-10-CM

## 2019-04-19 DIAGNOSIS — R200 Anesthesia of skin: Secondary | ICD-10-CM

## 2019-04-20 ENCOUNTER — Ambulatory Visit (INDEPENDENT_AMBULATORY_CARE_PROVIDER_SITE_OTHER): Payer: Medicare HMO | Admitting: Psychiatry

## 2019-04-20 ENCOUNTER — Other Ambulatory Visit: Payer: Self-pay

## 2019-04-20 DIAGNOSIS — G4700 Insomnia, unspecified: Secondary | ICD-10-CM | POA: Diagnosis not present

## 2019-04-20 DIAGNOSIS — F4312 Post-traumatic stress disorder, chronic: Secondary | ICD-10-CM

## 2019-04-20 DIAGNOSIS — F3132 Bipolar disorder, current episode depressed, moderate: Secondary | ICD-10-CM | POA: Diagnosis not present

## 2019-04-20 MED ORDER — OLANZAPINE 15 MG PO TABS: 15.0000 mg | ORAL_TABLET | Freq: Every day | ORAL | 1 refills | Status: DC

## 2019-04-20 MED ORDER — TRAZODONE HCL 100 MG PO TABS: 200.0000 mg | ORAL_TABLET | Freq: Every evening | ORAL | 2 refills | Status: DC | PRN

## 2019-04-20 MED ORDER — OXCARBAZEPINE 600 MG PO TABS: 600.0000 mg | ORAL_TABLET | Freq: Two times a day (BID) | ORAL | 2 refills | Status: DC

## 2019-04-20 MED ORDER — FLUOXETINE HCL 40 MG PO CAPS: 40.0000 mg | ORAL_CAPSULE | Freq: Every day | ORAL | 1 refills | Status: DC

## 2019-04-20 MED ORDER — CLONAZEPAM 1 MG PO TABS: 1.0000 mg | ORAL_TABLET | Freq: Two times a day (BID) | ORAL | 1 refills | Status: DC | PRN
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
## 2019-04-24 ENCOUNTER — Telehealth: Payer: Self-pay | Admitting: *Deleted

## 2019-04-24 NOTE — Telephone Encounter (Signed)
Called pt and LVM asking for call back. Left office number and hours in message.

## 2019-04-24 NOTE — Telephone Encounter (Signed)
-----   Message from Melvenia Beam, MD sent at 04/20/2019  3:09 PM EST ----- MRI of the cervical spine hasn't changed since her  last one in 2018, I don;t think this is cause of her  symptoms. I would sugest an emg/ncs if she is willing, I sent her a result note with a description of what an emg/ncs is but please calla nd if she agrees please let me know so we can order thanks.

## 2019-04-25 ENCOUNTER — Other Ambulatory Visit: Payer: Self-pay | Admitting: Neurology

## 2019-04-25 ENCOUNTER — Encounter: Payer: Self-pay | Admitting: *Deleted

## 2019-04-25 DIAGNOSIS — R29898 Other symptoms and signs involving the musculoskeletal system: Secondary | ICD-10-CM

## 2019-04-25 DIAGNOSIS — R200 Anesthesia of skin: Secondary | ICD-10-CM

## 2019-04-25 DIAGNOSIS — R202 Paresthesia of skin: Secondary | ICD-10-CM

## 2019-04-25 NOTE — Telephone Encounter (Signed)
Ordered, thanks.

## 2019-04-25 NOTE — Telephone Encounter (Signed)
Pt returned my call. We discussed the MRI c-spine results as noted below. Pt verbalized understanding. She is agreeable to the EMG/NCV. Pt scheduled for testing on Thurs 05/18/19 @ 7:45 AM arrival 7:15 AM. Pt advised to make sure skin is clean the morning of the appointment. She is also aware EMG information has been sent to her mychart. Pt verbalized appreciation for the call.

## 2019-05-02 ENCOUNTER — Other Ambulatory Visit (HOSPITAL_COMMUNITY): Payer: Self-pay | Admitting: Psychiatry

## 2019-05-02 ENCOUNTER — Telehealth (HOSPITAL_COMMUNITY): Payer: Self-pay | Admitting: *Deleted

## 2019-05-02 MED ORDER — MEGESTROL ACETATE 40 MG PO TABS: 40.0000 mg | ORAL_TABLET | Freq: Two times a day (BID) | ORAL | 5 refills | Status: DC

## 2019-05-02 MED ORDER — OXCARBAZEPINE 300 MG PO TABS: 300.0000 mg | ORAL_TABLET | Freq: Two times a day (BID) | ORAL | 2 refills | Status: DC

## 2019-05-02 NOTE — Telephone Encounter (Signed)
This is an adult patient and I will only call parents if patient is asking for it.

## 2019-05-02 NOTE — Telephone Encounter (Signed)
Writer spoke with pt regarding message left by pt that her parents are very concerned about the medications pt is currently taking. Pt stated that she had a "bad episode" over the weekend and they had to become involved with her care. Pt describes avh and responding to internal stimuli. Pt states she is comfortable with current medication regime but parents want a call from you. Writer went over meds with pt and did med ed however parents don't want to speak to this nurse preferring Dr.P to call. Please review.

## 2019-05-05 DIAGNOSIS — M797 Fibromyalgia: Secondary | ICD-10-CM | POA: Diagnosis not present

## 2019-05-05 DIAGNOSIS — I10 Essential (primary) hypertension: Secondary | ICD-10-CM | POA: Diagnosis not present

## 2019-05-05 DIAGNOSIS — E7800 Pure hypercholesterolemia, unspecified: Secondary | ICD-10-CM | POA: Diagnosis not present

## 2019-05-05 DIAGNOSIS — G43909 Migraine, unspecified, not intractable, without status migrainosus: Secondary | ICD-10-CM | POA: Diagnosis not present

## 2019-05-10 ENCOUNTER — Other Ambulatory Visit (HOSPITAL_COMMUNITY): Payer: Self-pay | Admitting: Psychiatry

## 2019-05-10 ENCOUNTER — Telehealth (HOSPITAL_COMMUNITY): Payer: Self-pay | Admitting: *Deleted

## 2019-05-10 ENCOUNTER — Encounter (HOSPITAL_COMMUNITY): Payer: Self-pay

## 2019-05-10 ENCOUNTER — Emergency Department (HOSPITAL_COMMUNITY)
Admission: EM | Admit: 2019-05-10 | Discharge: 2019-05-10 | Disposition: A | Discharge status: Home / Self Care | Payer: Medicare HMO | Attending: Emergency Medicine | Admitting: Emergency Medicine

## 2019-05-10 ENCOUNTER — Other Ambulatory Visit: Payer: Self-pay

## 2019-05-10 DIAGNOSIS — Z87820 Personal history of traumatic brain injury: Secondary | ICD-10-CM | POA: Insufficient documentation

## 2019-05-10 DIAGNOSIS — R451 Restlessness and agitation: Secondary | ICD-10-CM | POA: Insufficient documentation

## 2019-05-10 DIAGNOSIS — R531 Weakness: Secondary | ICD-10-CM | POA: Diagnosis not present

## 2019-05-10 DIAGNOSIS — R0902 Hypoxemia: Secondary | ICD-10-CM | POA: Diagnosis not present

## 2019-05-10 DIAGNOSIS — R400 Somnolence: Secondary | ICD-10-CM | POA: Diagnosis present

## 2019-05-10 DIAGNOSIS — I10 Essential (primary) hypertension: Secondary | ICD-10-CM | POA: Insufficient documentation

## 2019-05-10 DIAGNOSIS — Z79899 Other long term (current) drug therapy: Secondary | ICD-10-CM | POA: Diagnosis not present

## 2019-05-10 DIAGNOSIS — T50901A Poisoning by unspecified drugs, medicaments and biological substances, accidental (unintentional), initial encounter: Secondary | ICD-10-CM | POA: Diagnosis not present

## 2019-05-10 DIAGNOSIS — T50904A Poisoning by unspecified drugs, medicaments and biological substances, undetermined, initial encounter: Secondary | ICD-10-CM | POA: Diagnosis not present

## 2019-05-10 DIAGNOSIS — T887XXA Unspecified adverse effect of drug or medicament, initial encounter: Secondary | ICD-10-CM | POA: Diagnosis not present

## 2019-05-10 DIAGNOSIS — R404 Transient alteration of awareness: Secondary | ICD-10-CM | POA: Diagnosis not present

## 2019-05-10 LAB — CARBAMAZEPINE LEVEL, TOTAL: Carbamazepine Lvl: <2 ug/mL — ABNORMAL LOW (ref 4.0–12.0)

## 2019-05-10 LAB — SALICYLATE LEVEL: Salicylate Lvl: <7 mg/dL — ABNORMAL LOW (ref 7.0–30.0)

## 2019-05-10 LAB — COMPREHENSIVE METABOLIC PANEL
ALT: 24 U/L (ref 0–44)
AST: 26 U/L (ref 15–41)
Albumin: 3.4 g/dL — ABNORMAL LOW (ref 3.5–5.0)
Alkaline Phosphatase: 53 U/L (ref 38–126)
Anion gap: 11 (ref 5–15)
BUN: 13 mg/dL (ref 6–20)
CO2: 17 mmol/L — ABNORMAL LOW (ref 22–32)
Calcium: 8.3 mg/dL — ABNORMAL LOW (ref 8.9–10.3)
Chloride: 112 mmol/L — ABNORMAL HIGH (ref 98–111)
Creatinine, Ser: 0.62 mg/dL (ref 0.44–1.00)
GFR calc Af Amer: >60 mL/min (ref >60)
GFR calc non Af Amer: >60 mL/min (ref >60)
Glucose, Bld: 135 mg/dL — ABNORMAL HIGH (ref 70–99)
Potassium: 4.4 mmol/L (ref 3.5–5.1)
Sodium: 140 mmol/L (ref 135–145)
Total Bilirubin: 0.7 mg/dL (ref 0.3–1.2)
Total Protein: 5.8 g/dL — ABNORMAL LOW (ref 6.5–8.1)

## 2019-05-10 LAB — CBC
HCT: 35.6 % — ABNORMAL LOW (ref 36.0–46.0)
Hemoglobin: 11.1 g/dL — ABNORMAL LOW (ref 12.0–15.0)
MCH: 31.4 pg (ref 26.0–34.0)
MCHC: 31.2 g/dL (ref 30.0–36.0)
MCV: 100.8 fL — ABNORMAL HIGH (ref 80.0–100.0)
Platelets: UNDETERMINED 10*3/uL (ref 150–400)
RBC: 3.53 MIL/uL — ABNORMAL LOW (ref 3.87–5.11)
RDW: 13 % (ref 11.5–15.5)
WBC: 10.1 10*3/uL (ref 4.0–10.5)
nRBC: 0 % (ref 0.0–0.2)

## 2019-05-10 LAB — RAPID URINE DRUG SCREEN, HOSP PERFORMED
Amphetamines: NOT DETECTED
Barbiturates: NOT DETECTED
Benzodiazepines: POSITIVE — AB
Cocaine: NOT DETECTED
Opiates: NOT DETECTED
Tetrahydrocannabinol: NOT DETECTED

## 2019-05-10 LAB — PROTIME-INR
INR: 1.4 — ABNORMAL HIGH (ref 0.8–1.2)
Prothrombin Time: 16.8 seconds — ABNORMAL HIGH (ref 11.4–15.2)

## 2019-05-10 LAB — AMMONIA: Ammonia: 49 umol/L — ABNORMAL HIGH (ref 9–35)

## 2019-05-10 LAB — I-STAT BETA HCG BLOOD, ED (MC, WL, AP ONLY): I-stat hCG, quantitative: <5 m[IU]/mL (ref <5)

## 2019-05-10 LAB — ETHANOL: Alcohol, Ethyl (B): <10 mg/dL (ref <10)

## 2019-05-10 NOTE — ED Provider Notes (Signed)
  Provider Note MRN:  [id number]  Arrival date & time: 05/10/19    ED Course and Medical Decision Making  Assumed care from Dr. Waverly Ferrari at shift change.  Drug overdose, no suicidal ideation, accidental.  Awaiting repeat EKG, carbamazepine level, reassessment at 8 AM.  Suspect she will be discharged.  8:40 AM update: Repeat EKG is reassuring, carbamazepine level is low, not high.  Upon reassessment patient is acting normally, again denies any intent of self-harm, plan for discharge discussed with patient and patient's mother, who agree to follow-up with PCP.  Procedures  Final Clinical Impressions(s) / ED Diagnoses     ICD-10-CM   1. Accidental drug overdose, initial encounter  [place]     ED Discharge Orders    None      Discharge Instructions   None     Barth Kirks. Sedonia Small, [place] [email or web address]    Maudie Flakes, MD 05/10/19 [phone]

## 2019-05-10 NOTE — ED Notes (Signed)
Lab called need recollect on Lav

## 2019-05-10 NOTE — Telephone Encounter (Signed)
I have already reviewed her meds recently with her and with her mother. I do not see any need to stop or decrease doses of medications I prescribe beyond what I already decreased recently. She is on several meds which are not prescribed by me though and I suggested to discuss possible dose decreases of those with other providers.

## 2019-05-10 NOTE — ED Notes (Signed)
Repeat EKG obt and s/b Dr. Sedonia Small. MD declined TTS eval at this time and pt is cleared to be d/c'd to home. Pt denies SI or HI calm and cooperative. NAD. No complaints voiced. VSS. Cont to monitor.
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
## 2019-05-10 NOTE — ED Notes (Signed)
Lennette Bihari, Pharmacist from Poison control recommends maximizing potassium and magnesium at high normal and repeat EKG in 4 hours. Supportive care is recommended for 4 hours. If pt becomes agitated 1mg  of ativan recommended.

## 2019-05-10 NOTE — Telephone Encounter (Signed)
Writer received message form pt mother relying that pt overdosed last night and has been seen by [facility] and released to home. Overdose was deemed "accidental". Mother is very concerned about pt medication regime. Kristin Braun gives verbal consent to talk to parents. Please review.

## 2019-05-10 NOTE — ED Notes (Addendum)
Update given to Eustaquio Maize, Therapist, sports at [employer]. Pt awaiting Mother to bring her clothes for discharge.

## 2019-05-10 NOTE — Discharge Instructions (Addendum)
You were evaluated in the Emergency Department and after careful evaluation, we did not find any emergent condition requiring admission or further testing in the hospital.  Your exam/testing today was overall reassuring.  It is very important that you are taking your prescribed medications as directed.  Please follow-up with your primary care doctor to discuss your dosages and possibly receive further education on how to take them.  Please return to the Emergency Department if you experience any worsening of your condition.  We encourage you to follow up with a primary care provider.  Thank you for allowing [name] to be a part of your care.

## 2019-05-10 NOTE — ED Triage Notes (Signed)
Pt's mom called EMS because for the last two weeks she's been taking all of her medication at once, she hasn't been looking at the dose Pt has a TBI and lately she's been trying to get high off her meds but denies suicide  EMS reports that when she's awake she tried to induse vomiting

## 2019-05-11 ENCOUNTER — Ambulatory Visit (INDEPENDENT_AMBULATORY_CARE_PROVIDER_SITE_OTHER): Payer: Medicare HMO | Admitting: Psychiatry

## 2019-05-11 DIAGNOSIS — F319 Bipolar disorder, unspecified: Secondary | ICD-10-CM

## 2019-05-11 DIAGNOSIS — F4312 Post-traumatic stress disorder, chronic: Secondary | ICD-10-CM | POA: Diagnosis not present
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
## 2019-05-12 ENCOUNTER — Telehealth: Payer: Self-pay

## 2019-05-12 NOTE — Telephone Encounter (Signed)
Patient's mother called and stated that patient was picked up by the ambulance due to nearing an overdose. Mother stated that she doesn't know what patient should be taking or what she should be giving her. Please review and advise. Thank you.

## 2019-05-14 NOTE — Telephone Encounter (Signed)
Just a day before that call I had a lengthy conversation with mother and we went over all patient's meds (some of which she should not have been taking). Mother said she was taking notes and that they will be filling patient's pill box from now on. No new medications were added and some discontinued.

## 2019-05-18 ENCOUNTER — Ambulatory Visit: Payer: Medicare HMO | Admitting: Neurology

## 2019-05-18 ENCOUNTER — Ambulatory Visit (INDEPENDENT_AMBULATORY_CARE_PROVIDER_SITE_OTHER): Payer: Medicare HMO | Admitting: Neurology

## 2019-05-18 ENCOUNTER — Other Ambulatory Visit: Payer: Self-pay

## 2019-05-18 ENCOUNTER — Telehealth: Payer: Self-pay

## 2019-05-18 DIAGNOSIS — Z0289 Encounter for other administrative examinations: Secondary | ICD-10-CM

## 2019-05-18 DIAGNOSIS — K760 Fatty (change of) liver, not elsewhere classified: Secondary | ICD-10-CM

## 2019-05-18 DIAGNOSIS — R202 Paresthesia of skin: Secondary | ICD-10-CM

## 2019-05-18 DIAGNOSIS — M79602 Pain in left arm: Secondary | ICD-10-CM

## 2019-05-18 DIAGNOSIS — R200 Anesthesia of skin: Secondary | ICD-10-CM

## 2019-05-18 DIAGNOSIS — K824 Cholesterolosis of gallbladder: Secondary | ICD-10-CM

## 2019-05-18 DIAGNOSIS — R29898 Other symptoms and signs involving the musculoskeletal system: Secondary | ICD-10-CM

## 2019-05-18 NOTE — Progress Notes (Signed)
See procedure note.

## 2019-05-18 NOTE — Telephone Encounter (Signed)
Called and LM for pt that she is due for Ultrasound of RUQ. Scheduled for U/S on Wed, 4-14 at 9:00am arrive at 8:45am, NPO midnight.  Letter sent to MyChart and mailed to pt.

## 2019-05-18 NOTE — Progress Notes (Signed)
       Full Name: Kristin Braun Gender: Female MRN #: [id number] Date of Birth: [birth year]    Visit Date: 05/18/2019 06:32 Age: 44 Years Examining Physician: Sarina Ill, MD  Height: 5 feet 4 inch Patient History: 36.8 C  History: Left arm proximal pain and reported subjective weakness. MRI cervical spine was unrevealing for any causes. EMG/NCS today to further evaluate. Will send to PT.  Summary: EMG/NCS perform on the left upper extremity    Conclusion: Normal exam of the left arm; no evidence of mononeuropathy,polyneuropathy,radicuopathy or muscle disorder.  Sarina Ill M.D.  [facility] [place], [place] [address] Tel: [phone] Fax: [phone]         [facility]    Nerve / Sites Muscle Latency Ref. Amplitude Ref. Rel Amp Segments Distance Velocity Ref. Area    ms ms mV mV %  cm m/s m/s mVms  L Median - APB     Wrist APB 3.0 ?4.4 8.0 ?4.0 100 Wrist - APB 7   30.6     Upper arm APB 6.8  8.0  99.9 Upper arm - Wrist 22 57 ?49 29.2  L Ulnar - ADM     Wrist ADM 2.3 ?3.3 9.9 ?6.0 100 Wrist - ADM 7   27.9     B.Elbow ADM 5.1  9.5  96.2 B.Elbow - Wrist 20 70 ?49 26.4     A.Elbow ADM 6.6  9.8  103 A.Elbow - B.Elbow 10 66 ?49 27.0         A.Elbow - Wrist             SNC    Nerve / Sites Rec. Site Peak Lat Ref.  Amp Ref. Segments Distance Peak Diff Ref.    ms ms V V  cm ms ms  L Median, Ulnar - Transcarpal comparison     Median Palm Wrist 1.8 ?2.2 168 ?35 Median Palm - Wrist 8       Ulnar Palm Wrist 2.1 ?2.2 20 ?12 Ulnar Palm - Wrist 8          Median Palm - Ulnar Palm  -0.2 ?0.4  L Median - Orthodromic (Dig II, Mid palm)     Dig II Wrist 2.7 ?3.4 34 ?10 Dig II - Wrist 13    L Ulnar - Orthodromic, (Dig V, Mid palm)     Dig V Wrist 2.8 ?3.1 8 ?5 Dig V - Wrist 82             F  Wave    Nerve F Lat Ref.   ms ms  L Ulnar - ADM 26.3 ?32.0       EMG Summary Table    Spontaneous MUAP Recruitment  Muscle IA Fib PSW Fasc Other Amp Dur.  Poly Pattern  L. Deltoid Normal None None None _______ Normal Normal Normal Normal  L. Triceps brachii Normal None None None _______ Normal Normal Normal Normal  L. First dorsal interosseous Normal None None None _______ Normal Normal Normal Normal  L. Pronator teres Normal None None None _______ Normal Normal Normal Normal  L. Opponens pollicis Normal None None None _______ Normal Normal Normal Normal  L. Flexor digitorum profundus (Ulnar) Normal None None None _______ Normal Normal Normal Normal  L. Cervical paraspinals (low) Normal None None None _______ Normal Normal Normal Normal

## 2019-05-18 NOTE — Progress Notes (Signed)
Letter mailed to pt regarding U/S on 05-31-19 at [facility].

## 2019-05-18 NOTE — Telephone Encounter (Signed)
-----   Message from Roetta Sessions, [place] sent at 05/18/2018  5:03 PM EDT ----- Regarding: recall U/S 1 year Recall Ultrasound due 05-18-2019

## 2019-05-22 NOTE — Procedures (Signed)
       Full Name: Myrtice Scavetta Gender: Female MRN #: [id number] Date of Birth: 1976/01/02    Visit Date: 05/18/2019 06:32 Age: 44 Years Examining Physician: Sarina Ill, MD  Height: 5 feet 4 inch Patient History: 36.8 C  History: Left arm proximal pain and reported subjective weakness. MRI cervical spine was unrevealing for any causes. EMG/NCS today to further evaluate. Will send to PT.  Summary: EMG/NCS perform on the left upper extremity    Conclusion: Normal exam of the left arm; no evidence of mononeuropathy,polyneuropathy,radicuopathy or muscle disorder.  Sarina Ill M.D.  [facility] [place], [place] [address] Tel: [phone] Fax: [phone]         [facility]    Nerve / Sites Muscle Latency Ref. Amplitude Ref. Rel Amp Segments Distance Velocity Ref. Area    ms ms mV mV %  cm m/s m/s mVms  L Median - APB     Wrist APB 3.0 ?4.4 8.0 ?4.0 100 Wrist - APB 7   30.6     Upper arm APB 6.8  8.0  99.9 Upper arm - Wrist 22 57 ?49 29.2  L Ulnar - ADM     Wrist ADM 2.3 ?3.3 9.9 ?6.0 100 Wrist - ADM 7   27.9     B.Elbow ADM 5.1  9.5  96.2 B.Elbow - Wrist 20 70 ?49 26.4     A.Elbow ADM 6.6  9.8  103 A.Elbow - B.Elbow 10 66 ?49 27.0         A.Elbow - Wrist             SNC    Nerve / Sites Rec. Site Peak Lat Ref.  Amp Ref. Segments Distance Peak Diff Ref.    ms ms V V  cm ms ms  L Median, Ulnar - Transcarpal comparison     Median Palm Wrist 1.8 ?2.2 168 ?35 Median Palm - Wrist 8       Ulnar Palm Wrist 2.1 ?2.2 20 ?12 Ulnar Palm - Wrist 8          Median Palm - Ulnar Palm  -0.2 ?0.4  L Median - Orthodromic (Dig II, Mid palm)     Dig II Wrist 2.7 ?3.4 34 ?10 Dig II - Wrist 13    L Ulnar - Orthodromic, (Dig V, Mid palm)     Dig V Wrist 2.8 ?3.1 8 ?5 Dig V - Wrist 17             F  Wave    Nerve F Lat Ref.   ms ms  L Ulnar - ADM 26.3 ?32.0       EMG Summary Table    Spontaneous MUAP Recruitment  Muscle IA Fib PSW Fasc Other Amp Dur.  Poly Pattern  L. Deltoid Normal None None None _______ Normal Normal Normal Normal  L. Triceps brachii Normal None None None _______ Normal Normal Normal Normal  L. First dorsal interosseous Normal None None None _______ Normal Normal Normal Normal  L. Pronator teres Normal None None None _______ Normal Normal Normal Normal  L. Opponens pollicis Normal None None None _______ Normal Normal Normal Normal  L. Flexor digitorum profundus (Ulnar) Normal None None None _______ Normal Normal Normal Normal  L. Cervical paraspinals (low) Normal None None None _______ Normal Normal Normal Normal

## 2019-05-25 DIAGNOSIS — G40909 Epilepsy, unspecified, not intractable, without status epilepticus: Secondary | ICD-10-CM | POA: Diagnosis not present

## 2019-05-25 DIAGNOSIS — M62838 Other muscle spasm: Secondary | ICD-10-CM | POA: Diagnosis not present

## 2019-05-25 DIAGNOSIS — M797 Fibromyalgia: Secondary | ICD-10-CM | POA: Diagnosis not present

## 2019-05-31 ENCOUNTER — Other Ambulatory Visit: Payer: Self-pay

## 2019-05-31 ENCOUNTER — Encounter (HOSPITAL_COMMUNITY): Payer: Self-pay

## 2019-05-31 ENCOUNTER — Ambulatory Visit (HOSPITAL_COMMUNITY)
Admission: RE | Admit: 2019-05-31 | Discharge: 2019-05-31 | Disposition: A | Discharge status: Home / Self Care | Payer: Medicare HMO | Source: Ambulatory Visit | Attending: Gastroenterology | Admitting: Gastroenterology

## 2019-05-31 DIAGNOSIS — K824 Cholesterolosis of gallbladder: Secondary | ICD-10-CM

## 2019-05-31 DIAGNOSIS — K760 Fatty (change of) liver, not elsewhere classified: Secondary | ICD-10-CM

## 2019-06-01 ENCOUNTER — Other Ambulatory Visit: Payer: Self-pay

## 2019-06-01 ENCOUNTER — Ambulatory Visit (HOSPITAL_COMMUNITY)
Admission: RE | Admit: 2019-06-01 | Discharge: 2019-06-01 | Disposition: A | Discharge status: Home / Self Care | Payer: Medicare HMO | Source: Ambulatory Visit | Attending: Gastroenterology | Admitting: Gastroenterology

## 2019-06-01 DIAGNOSIS — K824 Cholesterolosis of gallbladder: Secondary | ICD-10-CM | POA: Diagnosis not present

## 2019-06-01 DIAGNOSIS — K760 Fatty (change of) liver, not elsewhere classified: Secondary | ICD-10-CM | POA: Insufficient documentation

## 2019-06-03 DIAGNOSIS — R0602 Shortness of breath: Secondary | ICD-10-CM | POA: Diagnosis not present

## 2019-06-03 DIAGNOSIS — R635 Abnormal weight gain: Secondary | ICD-10-CM | POA: Diagnosis not present

## 2019-06-05 ENCOUNTER — Telehealth: Payer: Self-pay | Admitting: *Deleted

## 2019-06-05 NOTE — Telephone Encounter (Signed)
I called [employer] [phone] and spoke to [place]. She states that [id number] and [phone] are valid and billable. [id number] will require PA. She referred me to call Medication Intake Team [phone].  Ref# for call is [id number].  She transferred me to [phone] and I spoke to [place].  She states that there is a PA on file already.  The PA# is [id number] valid from 04/18/2018-02/16/2020.  Ref# for call is [phone].

## 2019-06-06 ENCOUNTER — Telehealth: Payer: Self-pay | Admitting: Neurology

## 2019-06-06 NOTE — Telephone Encounter (Signed)
Pt called stating that she will be keeping the appt she has for her Botox but after this appt she will not be able to continue to receive Botox due to it being expensive and her not being able to afford it and also her insurance will no longer cover it. Pt would like to know if there is another medication she can take for her migraines.

## 2019-06-06 NOTE — Telephone Encounter (Signed)
Spoke with both pt Psychologist, forensic. Pt will need to pay towards her balance before appt can be scheduled. Pt at this time will prefer office visit to discuss other medications. She will pay $50 plus co-pay after she gets her next check at the beginning of next month and then schedule appt. Billing manager aware.

## 2019-06-14 ENCOUNTER — Ambulatory Visit: Payer: Self-pay | Admitting: Neurology

## 2019-06-16 ENCOUNTER — Telehealth: Payer: Self-pay | Admitting: *Deleted

## 2019-06-16 NOTE — Telephone Encounter (Signed)
Patient did not show for her 3rd [facility]rix this morning. Tried calling patient x2 to reschedule and patient answered the phone and immediately hung up.

## 2019-06-20 ENCOUNTER — Telehealth (INDEPENDENT_AMBULATORY_CARE_PROVIDER_SITE_OTHER): Payer: Medicare HMO | Admitting: Psychiatry

## 2019-06-20 ENCOUNTER — Ambulatory Visit (HOSPITAL_COMMUNITY): Payer: Medicare HMO | Admitting: Psychiatry

## 2019-06-20 ENCOUNTER — Other Ambulatory Visit: Payer: Self-pay

## 2019-06-20 DIAGNOSIS — F3132 Bipolar disorder, current episode depressed, moderate: Secondary | ICD-10-CM | POA: Diagnosis not present

## 2019-06-20 DIAGNOSIS — G4700 Insomnia, unspecified: Secondary | ICD-10-CM

## 2019-06-20 DIAGNOSIS — F4312 Post-traumatic stress disorder, chronic: Secondary | ICD-10-CM | POA: Diagnosis not present

## 2019-06-20 MED ORDER — OLANZAPINE 20 MG PO TABS: 20.0000 mg | ORAL_TABLET | Freq: Every day | ORAL | 1 refills | Status: DC

## 2019-06-20 MED ORDER — TRAZODONE HCL 150 MG PO TABS: 150.0000 mg | ORAL_TABLET | Freq: Every evening | ORAL | 2 refills | Status: DC | PRN

## 2019-06-20 MED ORDER — CLONAZEPAM 1 MG PO TABS: 1.0000 mg | ORAL_TABLET | Freq: Two times a day (BID) | ORAL | 1 refills | Status: DC | PRN
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
## 2019-07-15 ENCOUNTER — Other Ambulatory Visit (HOSPITAL_COMMUNITY): Payer: Self-pay | Admitting: Psychiatry

## 2019-07-15 DIAGNOSIS — G4700 Insomnia, unspecified: Secondary | ICD-10-CM

## 2019-07-25 ENCOUNTER — Other Ambulatory Visit (HOSPITAL_COMMUNITY): Payer: Self-pay | Admitting: *Deleted

## 2019-07-25 MED ORDER — OXCARBAZEPINE 300 MG PO TABS: 300.0000 mg | ORAL_TABLET | Freq: Two times a day (BID) | ORAL | 2 refills | Status: DC

## 2019-08-17 ENCOUNTER — Telehealth (INDEPENDENT_AMBULATORY_CARE_PROVIDER_SITE_OTHER): Payer: Medicare HMO | Admitting: Psychiatry

## 2019-08-17 ENCOUNTER — Other Ambulatory Visit: Payer: Self-pay

## 2019-08-17 ENCOUNTER — Other Ambulatory Visit (HOSPITAL_COMMUNITY): Payer: Self-pay | Admitting: *Deleted

## 2019-08-17 DIAGNOSIS — F3132 Bipolar disorder, current episode depressed, moderate: Secondary | ICD-10-CM

## 2019-08-17 DIAGNOSIS — F4312 Post-traumatic stress disorder, chronic: Secondary | ICD-10-CM | POA: Diagnosis not present

## 2019-08-17 DIAGNOSIS — G4700 Insomnia, unspecified: Secondary | ICD-10-CM | POA: Diagnosis not present

## 2019-08-17 DIAGNOSIS — F319 Bipolar disorder, unspecified: Secondary | ICD-10-CM

## 2019-08-17 MED ORDER — CLONAZEPAM 1 MG PO TABS: 1.0000 mg | ORAL_TABLET | Freq: Two times a day (BID) | ORAL | 1 refills | Status: DC | PRN

## 2019-08-17 MED ORDER — TRAZODONE HCL 150 MG PO TABS: 150.0000 mg | ORAL_TABLET | Freq: Every evening | ORAL | 0 refills | Status: DC | PRN

## 2019-08-17 MED ORDER — OLANZAPINE 20 MG PO TABS: 20.0000 mg | ORAL_TABLET | Freq: Every day | ORAL | 1 refills | Status: DC
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
## 2019-09-28 ENCOUNTER — Other Ambulatory Visit (HOSPITAL_COMMUNITY): Payer: Self-pay | Admitting: *Deleted

## 2019-09-28 ENCOUNTER — Other Ambulatory Visit: Payer: Self-pay

## 2019-09-28 ENCOUNTER — Telehealth (INDEPENDENT_AMBULATORY_CARE_PROVIDER_SITE_OTHER): Payer: Medicare HMO | Admitting: Psychiatry

## 2019-09-28 DIAGNOSIS — F4312 Post-traumatic stress disorder, chronic: Secondary | ICD-10-CM | POA: Diagnosis not present

## 2019-09-28 DIAGNOSIS — F3132 Bipolar disorder, current episode depressed, moderate: Secondary | ICD-10-CM

## 2019-09-28 MED ORDER — OXCARBAZEPINE 300 MG PO TABS: 300.0000 mg | ORAL_TABLET | Freq: Two times a day (BID) | ORAL | 2 refills | Status: DC

## 2019-09-28 MED ORDER — CLONAZEPAM 1 MG PO TABS: 1.0000 mg | ORAL_TABLET | Freq: Two times a day (BID) | ORAL | 1 refills | Status: DC | PRN

## 2019-09-28 MED ORDER — OLANZAPINE 20 MG PO TABS: 20.0000 mg | ORAL_TABLET | Freq: Every day | ORAL | 1 refills | Status: DC

## 2019-09-28 MED ORDER — FLUOXETINE HCL 40 MG PO CAPS: 40.0000 mg | ORAL_CAPSULE | Freq: Every day | ORAL | 1 refills | Status: DC
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
## 2019-11-04 ENCOUNTER — Other Ambulatory Visit (HOSPITAL_COMMUNITY): Payer: Self-pay | Admitting: Psychiatry

## 2019-11-30 ENCOUNTER — Telehealth (INDEPENDENT_AMBULATORY_CARE_PROVIDER_SITE_OTHER): Payer: Medicare HMO | Admitting: Psychiatry

## 2019-11-30 ENCOUNTER — Other Ambulatory Visit: Payer: Self-pay

## 2019-11-30 DIAGNOSIS — F4312 Post-traumatic stress disorder, chronic: Secondary | ICD-10-CM | POA: Diagnosis not present

## 2019-11-30 DIAGNOSIS — G4700 Insomnia, unspecified: Secondary | ICD-10-CM | POA: Diagnosis not present

## 2019-11-30 DIAGNOSIS — F3132 Bipolar disorder, current episode depressed, moderate: Secondary | ICD-10-CM | POA: Diagnosis not present

## 2019-11-30 MED ORDER — CLONAZEPAM 1 MG PO TABS
1.0000 mg | ORAL_TABLET | Freq: Two times a day (BID) | ORAL | 1 refills | Status: DC | PRN
Start: 2019-12-15 — End: 2020-02-20

## 2019-11-30 MED ORDER — TRAZODONE HCL 150 MG PO TABS: 150.0000 mg | ORAL_TABLET | Freq: Every evening | ORAL | 0 refills | Status: DC | PRN

## 2019-11-30 MED ORDER — ZIPRASIDONE HCL 40 MG PO CAPS
40.0000 mg | ORAL_CAPSULE | Freq: Every day | ORAL | 1 refills | Status: DC
Start: 2019-11-30 — End: 2020-01-03
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
## 2019-12-28 ENCOUNTER — Other Ambulatory Visit: Payer: Self-pay | Admitting: Neurology

## 2019-12-28 DIAGNOSIS — G40A19 Absence epileptic syndrome, intractable, without status epilepticus: Secondary | ICD-10-CM

## 2019-12-28 DIAGNOSIS — G43109 Migraine with aura, not intractable, without status migrainosus: Secondary | ICD-10-CM

## 2019-12-28 DIAGNOSIS — G40209 Localization-related (focal) (partial) symptomatic epilepsy and epileptic syndromes with complex partial seizures, not intractable, without status epilepticus: Secondary | ICD-10-CM

## 2019-12-28 DIAGNOSIS — S062X4S Diffuse traumatic brain injury with loss of consciousness of 6 hours to 24 hours, sequela: Secondary | ICD-10-CM

## 2019-12-28 DIAGNOSIS — Z87820 Personal history of traumatic brain injury: Secondary | ICD-10-CM

## 2020-01-03 ENCOUNTER — Other Ambulatory Visit: Payer: Self-pay

## 2020-01-03 ENCOUNTER — Telehealth (INDEPENDENT_AMBULATORY_CARE_PROVIDER_SITE_OTHER): Payer: Medicare HMO | Admitting: Psychiatry

## 2020-01-03 DIAGNOSIS — F3162 Bipolar disorder, current episode mixed, moderate: Secondary | ICD-10-CM | POA: Diagnosis not present

## 2020-01-03 DIAGNOSIS — F4312 Post-traumatic stress disorder, chronic: Secondary | ICD-10-CM | POA: Diagnosis not present

## 2020-01-03 MED ORDER — ZIPRASIDONE HCL 20 MG PO CAPS
ORAL_CAPSULE | ORAL | 1 refills | Status: DC
Start: 2020-01-03 — End: 2020-04-03

## 2020-01-03 MED ORDER — OXCARBAZEPINE 600 MG PO TABS: 600.0000 mg | ORAL_TABLET | Freq: Two times a day (BID) | ORAL | 2 refills | Status: DC
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
## 2020-02-14 ENCOUNTER — Other Ambulatory Visit (HOSPITAL_COMMUNITY): Payer: Self-pay | Admitting: Psychiatry

## 2020-02-19 ENCOUNTER — Other Ambulatory Visit (HOSPITAL_COMMUNITY): Payer: Self-pay | Admitting: Psychiatry

## 2020-02-29 ENCOUNTER — Encounter (HOSPITAL_COMMUNITY): Payer: Self-pay | Admitting: Emergency Medicine

## 2020-02-29 ENCOUNTER — Emergency Department (HOSPITAL_COMMUNITY)
Admission: EM | Admit: 2020-02-29 | Discharge: 2020-02-29 | Disposition: A | Discharge status: Home / Self Care | Payer: Medicare Other | Attending: Emergency Medicine | Admitting: Emergency Medicine

## 2020-02-29 DIAGNOSIS — K219 Gastro-esophageal reflux disease without esophagitis: Secondary | ICD-10-CM | POA: Insufficient documentation

## 2020-02-29 DIAGNOSIS — S062X4S Diffuse traumatic brain injury with loss of consciousness of 6 hours to 24 hours, sequela: Secondary | ICD-10-CM | POA: Diagnosis not present

## 2020-02-29 DIAGNOSIS — Z79899 Other long term (current) drug therapy: Secondary | ICD-10-CM | POA: Diagnosis not present

## 2020-02-29 DIAGNOSIS — R10817 Generalized abdominal tenderness: Secondary | ICD-10-CM | POA: Diagnosis not present

## 2020-02-29 DIAGNOSIS — R197 Diarrhea, unspecified: Secondary | ICD-10-CM | POA: Diagnosis present

## 2020-02-29 DIAGNOSIS — I10 Essential (primary) hypertension: Secondary | ICD-10-CM | POA: Diagnosis not present

## 2020-02-29 DIAGNOSIS — G43109 Migraine with aura, not intractable, without status migrainosus: Secondary | ICD-10-CM

## 2020-02-29 DIAGNOSIS — G40A19 Absence epileptic syndrome, intractable, without status epilepticus: Secondary | ICD-10-CM | POA: Insufficient documentation

## 2020-02-29 DIAGNOSIS — Z87820 Personal history of traumatic brain injury: Secondary | ICD-10-CM | POA: Diagnosis not present

## 2020-02-29 DIAGNOSIS — U071 COVID-19: Secondary | ICD-10-CM | POA: Diagnosis not present

## 2020-02-29 DIAGNOSIS — G40209 Localization-related (focal) (partial) symptomatic epilepsy and epileptic syndromes with complex partial seizures, not intractable, without status epilepticus: Secondary | ICD-10-CM | POA: Diagnosis not present

## 2020-02-29 DIAGNOSIS — X58XXXA Exposure to other specified factors, initial encounter: Secondary | ICD-10-CM | POA: Diagnosis not present

## 2020-02-29 LAB — CBC
HCT: 51.3 % — ABNORMAL HIGH (ref 36.0–46.0)
Hemoglobin: 16.6 g/dL — ABNORMAL HIGH (ref 12.0–15.0)
MCH: 30.1 pg (ref 26.0–34.0)
MCHC: 32.4 g/dL (ref 30.0–36.0)
MCV: 93.1 fL (ref 80.0–100.0)
Platelets: 238 10*3/uL (ref 150–400)
RBC: 5.51 MIL/uL — ABNORMAL HIGH (ref 3.87–5.11)
RDW: 13.2 % (ref 11.5–15.5)
WBC: 11.7 10*3/uL — ABNORMAL HIGH (ref 4.0–10.5)
nRBC: 0 % (ref 0.0–0.2)

## 2020-02-29 LAB — COMPREHENSIVE METABOLIC PANEL
ALT: 49 U/L — ABNORMAL HIGH (ref 0–44)
AST: 39 U/L (ref 15–41)
Albumin: 4.4 g/dL (ref 3.5–5.0)
Alkaline Phosphatase: 85 U/L (ref 38–126)
Anion gap: 17 — ABNORMAL HIGH (ref 5–15)
BUN: 13 mg/dL (ref 6–20)
CO2: 15 mmol/L — ABNORMAL LOW (ref 22–32)
Calcium: 9.3 mg/dL (ref 8.9–10.3)
Chloride: 109 mmol/L (ref 98–111)
Creatinine, Ser: 0.91 mg/dL (ref 0.44–1.00)
GFR, Estimated: >60 mL/min (ref >60)
Glucose, Bld: 72 mg/dL (ref 70–99)
Potassium: 3.2 mmol/L — ABNORMAL LOW (ref 3.5–5.1)
Sodium: 141 mmol/L (ref 135–145)
Total Bilirubin: 1 mg/dL (ref 0.3–1.2)
Total Protein: 7.9 g/dL (ref 6.5–8.1)

## 2020-02-29 LAB — MAGNESIUM: Magnesium: 2.2 mg/dL (ref 1.7–2.4)

## 2020-02-29 MED ORDER — TOPIRAMATE 200 MG PO TABS: 200.0000 mg | ORAL_TABLET | Freq: Two times a day (BID) | ORAL | 0 refills | Status: DC

## 2020-02-29 MED ORDER — ONDANSETRON 4 MG PO TBDP: 4.0000 mg | ORAL_TABLET | Freq: Three times a day (TID) | ORAL | 0 refills | Status: AC | PRN

## 2020-02-29 MED ORDER — LACTATED RINGERS IV BOLUS
1000.0000 mL | Freq: Once | INTRAVENOUS | Status: AC
  Administered 2020-02-29: 1000 mL via INTRAVENOUS

## 2020-02-29 MED ORDER — METOCLOPRAMIDE HCL 5 MG/ML IJ SOLN
10.0000 mg | Freq: Once | INTRAMUSCULAR | Status: AC
  Administered 2020-02-29: 10 mg via INTRAVENOUS
  Filled 2020-02-29: qty 2

## 2020-02-29 MED ORDER — POTASSIUM CHLORIDE CRYS ER 20 MEQ PO TBCR
40.0000 meq | EXTENDED_RELEASE_TABLET | Freq: Once | ORAL | Status: AC
  Administered 2020-02-29: 40 meq via ORAL
  Filled 2020-02-29: qty 2

## 2020-02-29 MED ORDER — KETOROLAC TROMETHAMINE 15 MG/ML IJ SOLN
15.0000 mg | Freq: Once | INTRAMUSCULAR | Status: AC
  Administered 2020-02-29: 15 mg via INTRAVENOUS
  Filled 2020-02-29: qty 1

## 2020-02-29 MED ORDER — TOPIRAMATE 25 MG PO TABS
200.0000 mg | ORAL_TABLET | Freq: Once | ORAL | Status: AC
  Administered 2020-02-29: 200 mg via ORAL
  Filled 2020-02-29: qty 8

## 2020-02-29 MED ORDER — ONDANSETRON 4 MG PO TBDP
4.0000 mg | ORAL_TABLET | Freq: Once | ORAL | Status: AC
  Administered 2020-02-29: 4 mg via ORAL
  Filled 2020-02-29: qty 1

## 2020-02-29 NOTE — Discharge Instructions (Signed)
Please continue to stay hydrated by drinking plenty of fluids, return to the emergency department if you develop bloody diarrhea, high fevers, concerns for dehydration with inability to eat or drink.  A refill for your Topamax was called into the [facility] as well as for Zofran which will help with nausea, use as needed

## 2020-02-29 NOTE — ED Triage Notes (Signed)
Patient BIB [facility] after patient was told by PCP to go to ED for evaluation. Patient is COVID positive, complains of abdominal cramping and diarrhea. Vital signs within normal limits, SpO2 99% on room air, afebrile in triage. Denies shortness of breath.
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
## 2020-02-29 NOTE — ED Notes (Signed)
Pt complaining of a migraine and asking for something for it.  States that she has absence siezures, that she spaced out for a period of time and she was visibly incontinent of urine.

## 2020-03-27 ENCOUNTER — Other Ambulatory Visit (HOSPITAL_COMMUNITY): Payer: Self-pay | Admitting: Psychiatry

## 2020-04-03 ENCOUNTER — Other Ambulatory Visit: Payer: Self-pay

## 2020-04-03 ENCOUNTER — Telehealth (INDEPENDENT_AMBULATORY_CARE_PROVIDER_SITE_OTHER): Payer: Medicare HMO | Admitting: Psychiatry

## 2020-04-03 DIAGNOSIS — F319 Bipolar disorder, unspecified: Secondary | ICD-10-CM | POA: Diagnosis not present

## 2020-04-03 DIAGNOSIS — F4312 Post-traumatic stress disorder, chronic: Secondary | ICD-10-CM | POA: Diagnosis not present

## 2020-04-03 DIAGNOSIS — G4700 Insomnia, unspecified: Secondary | ICD-10-CM

## 2020-04-03 MED ORDER — OXCARBAZEPINE 600 MG PO TABS
600.0000 mg | ORAL_TABLET | Freq: Two times a day (BID) | ORAL | 2 refills | Status: DC
Start: 2020-04-03 — End: 2023-04-12

## 2020-04-03 MED ORDER — CLONAZEPAM 1 MG PO TABS: ORAL_TABLET | ORAL | 1 refills | Status: DC

## 2020-04-03 MED ORDER — TRAZODONE HCL 150 MG PO TABS: 150.0000 mg | ORAL_TABLET | Freq: Every evening | ORAL | 0 refills | Status: DC | PRN

## 2020-04-03 MED ORDER — FLUOXETINE HCL 40 MG PO CAPS: 40.0000 mg | ORAL_CAPSULE | Freq: Every day | ORAL | 1 refills | Status: DC

## 2020-04-03 MED ORDER — ZIPRASIDONE HCL 40 MG PO CAPS: 40.0000 mg | ORAL_CAPSULE | Freq: Every day | ORAL | 2 refills | Status: DC
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
## 2020-04-04 ENCOUNTER — Telehealth (HOSPITAL_COMMUNITY): Payer: Self-pay | Admitting: Psychiatry

## 2020-04-11 ENCOUNTER — Telehealth (HOSPITAL_COMMUNITY): Payer: Self-pay | Admitting: *Deleted

## 2020-04-11 NOTE — Telephone Encounter (Signed)
Pt called requesting refill of the Zyprexa. Writer advised that Zyprexa has been discontinued and she should be taking Geodon 40mg  qhs. Pt verbalizes understanding and stated that she would throw away the Olanzapine. FYI

## 2020-04-15 ENCOUNTER — Telehealth: Payer: Self-pay | Admitting: Neurology

## 2020-04-15 NOTE — Telephone Encounter (Signed)
Pt. is requesting a refill for topiramate (TOPAMAX) 200 MG tablet. She states she's not sure if she needs to come in for an office visit first. Please advise.

## 2020-04-15 NOTE — Telephone Encounter (Signed)
Called the patient to advise that an appointment will be needed for her yearly medication management.  Appointment was placed with Megan on March 3 at 11:00 AM.  Patient instructed to arrive for check-in at 10:30 AM.  Patient has enough medication to last her til the apt. she verbalized understanding of the appointment and had no further questions

## 2020-04-18 ENCOUNTER — Ambulatory Visit: Payer: Self-pay | Admitting: Adult Health

## 2020-04-22 ENCOUNTER — Encounter: Payer: Self-pay | Admitting: Adult Health

## 2020-04-22 ENCOUNTER — Ambulatory Visit: Payer: Medicare HMO | Admitting: Adult Health

## 2020-04-22 VITALS — BP 125/91 | HR 101 | Ht 64.0 in | Wt 230.0 lb

## 2020-04-22 DIAGNOSIS — R569 Unspecified convulsions: Secondary | ICD-10-CM

## 2020-04-22 DIAGNOSIS — G40209 Localization-related (focal) (partial) symptomatic epilepsy and epileptic syndromes with complex partial seizures, not intractable, without status epilepticus: Secondary | ICD-10-CM

## 2020-04-22 MED ORDER — TOPIRAMATE 200 MG PO TABS: 200.0000 mg | ORAL_TABLET | Freq: Two times a day (BID) | ORAL | 11 refills | Status: DC
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
## 2020-04-22 NOTE — Patient Instructions (Addendum)
Your Plan:  Continue Trileptal-- prescribed by psychiatrist  Continue Topamax Keep a seizure journal  If your symptoms worsen or you develop new symptoms please let us know.    Thank you for coming to see [name] at [facility]. I hope we have been able to provide you high quality care today.  You may receive a patient satisfaction survey over the next few weeks. We would appreciate your feedback and comments so that we may continue to improve ourselves and the health of our patients.

## 2020-04-25 ENCOUNTER — Telehealth: Payer: Self-pay | Admitting: Neurology

## 2020-04-25 NOTE — Telephone Encounter (Signed)
If insurance will pay without a visit she can be scheduled with Amy or Megan (not ahern) thanks

## 2020-04-25 NOTE — Telephone Encounter (Signed)
Received voicemail from patient requesting to be scheduled for Botox. I see her last injection was in 05/2019. Is it okay to schedule patient? If so, please let me know who she should see.

## 2020-04-29 NOTE — Telephone Encounter (Addendum)
I called the patient and got her scheduled for 3/24 at 11:00 with Megan. Patient has [employer]. I will fill out PA form and give to NP to sign.

## 2020-04-30 NOTE — Telephone Encounter (Signed)
Yes please

## 2020-04-30 NOTE — Telephone Encounter (Addendum)
I spoke with Angie in billing. Patient has a balance of $4347.88. She must pay this balance before being scheduled for Botox. I called the patient to let her know and then transferred her to the billing department.

## 2020-05-09 ENCOUNTER — Ambulatory Visit: Payer: Medicare HMO | Admitting: Adult Health

## 2020-05-29 IMAGING — NM NM HEPATO W/GB/PHARM/[PERSON_NAME]
2 series · 12 of 12 positions shown · non-contrast
Comparison: None

CLINICAL DATA: Abdominal and epigastric pain for 1 year, bilirubin
0.3 on [DATE] 02/05/2019

EXAM:
NUCLEAR MEDICINE HEPATOBILIARY IMAGING WITH GALLBLADDER EF
TECHNIQUE: Sequential images of the abdomen were obtained [DATE] minutes
following intravenous administration of radiopharmaceutical. After
oral ingestion of Ensure, gallbladder ejection fraction was
determined. At 60 min, normal ejection fraction is greater than 33%.
RADIOPHARMACEUTICALS:  5.3 mCi Wc-JJm  Choletec IV

[Series 1: gbef · 3.25mm/px · 6 of 60 frames shown]
[frame 6/60]
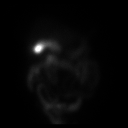
[frame 16/60]
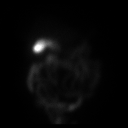
[frame 26/60]
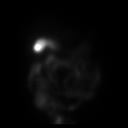
[frame 36/60]
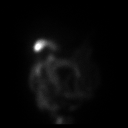
[frame 46/60]
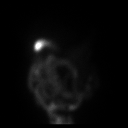
[frame 56/60]
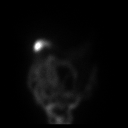

[Series 2: biliary · 3.25mm/px · 6 of 60 frames shown]
[frame 6/60]
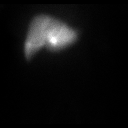
[frame 16/60]
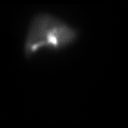
[frame 26/60]
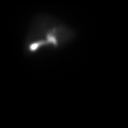
[frame 36/60]
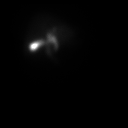
[frame 46/60]
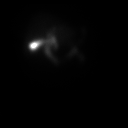
[frame 56/60]
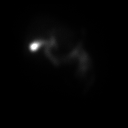

[12 of 12 positions shown; findings below may reference images not displayed]

FINDINGS: Normal tracer extraction from bloodstream indicating normal
hepatocellular function.

Normal excretion of tracer into biliary tree.

Gallbladder visualized at 15 min.

Small bowel visualized at 33 min.

No hepatic retention of tracer.

Subjectively normal emptying of tracer from gallbladder following
fatty meal stimulation.

Calculated gallbladder ejection fraction is 38%, normal.

Patient reported pain following Ensure ingestion.

Normal gallbladder ejection fraction following Ensure ingestion is
greater than 33% at 1 hour.
IMPRESSION: Patent biliary tree with normal gallbladder ejection fraction of 38%
following fatty meal stimulation.

Patient reported abdominal pain following Ensure ingestion.

## 2020-05-30 ENCOUNTER — Other Ambulatory Visit: Payer: Self-pay

## 2020-05-30 ENCOUNTER — Telehealth (HOSPITAL_COMMUNITY): Payer: Medicare HMO | Admitting: Psychiatry
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
## 2020-06-04 ENCOUNTER — Encounter (HOSPITAL_COMMUNITY): Payer: Self-pay

## 2020-06-04 ENCOUNTER — Other Ambulatory Visit: Payer: Self-pay

## 2020-06-04 ENCOUNTER — Telehealth (HOSPITAL_COMMUNITY): Payer: Medicare HMO | Admitting: Psychiatry

## 2020-06-06 ENCOUNTER — Telehealth (INDEPENDENT_AMBULATORY_CARE_PROVIDER_SITE_OTHER): Payer: Medicare HMO | Admitting: Psychiatry

## 2020-06-06 ENCOUNTER — Encounter (HOSPITAL_COMMUNITY): Payer: Self-pay | Admitting: Psychiatry

## 2020-06-06 ENCOUNTER — Other Ambulatory Visit: Payer: Self-pay

## 2020-06-06 DIAGNOSIS — F4312 Post-traumatic stress disorder, chronic: Secondary | ICD-10-CM | POA: Diagnosis not present

## 2020-06-06 DIAGNOSIS — F3162 Bipolar disorder, current episode mixed, moderate: Secondary | ICD-10-CM
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
## 2020-07-08 ENCOUNTER — Telehealth (HOSPITAL_COMMUNITY): Payer: Self-pay | Admitting: *Deleted

## 2020-07-08 ENCOUNTER — Telehealth (INDEPENDENT_AMBULATORY_CARE_PROVIDER_SITE_OTHER): Payer: Medicare (Managed Care) | Admitting: Psychiatry

## 2020-07-08 ENCOUNTER — Encounter (HOSPITAL_COMMUNITY): Payer: Self-pay

## 2020-07-08 ENCOUNTER — Other Ambulatory Visit: Payer: Self-pay

## 2020-07-08 DIAGNOSIS — F3162 Bipolar disorder, current episode mixed, moderate: Secondary | ICD-10-CM

## 2020-07-08 DIAGNOSIS — F4312 Post-traumatic stress disorder, chronic: Secondary | ICD-10-CM

## 2020-07-08 NOTE — Progress Notes (Signed)
Patient did not respond to phone call. To be rescheduled.   Karsten Fells, MD

## 2020-07-12 MED ORDER — CLONAZEPAM 1 MG PO TABS: ORAL_TABLET | ORAL | 0 refills | Status: DC

## 2020-07-12 NOTE — Telephone Encounter (Signed)
Chart reviewed.  Patient was no-show with Dr. Barbaraann Rondo on the last visit.  A 30-day bridge supply of Klonopin 1 mg to take twice a day provided until patient establish care with new provider.

## 2020-07-31 ENCOUNTER — Ambulatory Visit: Payer: Medicare HMO | Admitting: Neurology

## 2020-08-22 ENCOUNTER — Other Ambulatory Visit (HOSPITAL_COMMUNITY): Payer: Self-pay | Admitting: Psychiatry

## 2020-09-22 NOTE — Telephone Encounter (Signed)
Patient with intractable headaches, phone advise to come to office for IV treatment.

## 2020-10-23 ENCOUNTER — Ambulatory Visit: Payer: Medicare Other | Admitting: Neurology

## 2020-10-23 ENCOUNTER — Telehealth: Payer: Self-pay | Admitting: Neurology

## 2020-10-23 ENCOUNTER — Encounter: Payer: Self-pay | Admitting: Neurology

## 2020-10-23 VITALS — BP 110/70 | HR 95 | Ht 64.0 in | Wt 186.0 lb

## 2020-10-23 DIAGNOSIS — R569 Unspecified convulsions: Secondary | ICD-10-CM

## 2020-10-23 DIAGNOSIS — S069X2S Unspecified intracranial injury with loss of consciousness of 31 minutes to 59 minutes, sequela: Secondary | ICD-10-CM

## 2020-10-23 DIAGNOSIS — Z79899 Other long term (current) drug therapy: Secondary | ICD-10-CM

## 2020-10-23 DIAGNOSIS — G43711 Chronic migraine without aura, intractable, with status migrainosus: Secondary | ICD-10-CM

## 2020-10-23 DIAGNOSIS — G40209 Localization-related (focal) (partial) symptomatic epilepsy and epileptic syndromes with complex partial seizures, not intractable, without status epilepticus: Secondary | ICD-10-CM | POA: Diagnosis not present

## 2020-10-23 DIAGNOSIS — R270 Ataxia, unspecified: Secondary | ICD-10-CM

## 2020-10-23 MED ORDER — PROMETHAZINE HCL 12.5 MG PO TABS: 12.5000 mg | ORAL_TABLET | Freq: Three times a day (TID) | ORAL | 5 refills | Status: DC | PRN

## 2020-10-23 MED ORDER — UBRELVY 100 MG PO TABS: 100.0000 mg | ORAL_TABLET | ORAL | 1 refills | Status: DC | PRN

## 2020-10-23 MED ORDER — EMGALITY 120 MG/ML ~~LOC~~ SOAJ: SUBCUTANEOUS | 5 refills | Status: DC

## 2020-10-23 NOTE — Patient Instructions (Signed)
Promethazine Injection What is this medication? PROMETHAZINE (proe METH a zeen) prevents and treats the symptoms of an allergic reaction. It works by blocking histamine, a substance released by the body during an allergic reaction. It may also help you relax, go to sleep, and relieve nausea, vomiting, or pain before or after procedures. It can also treat motion sickness. It works by helping your nervous system calm down by blocking substances in the body that may cause nausea and vomiting. It belongs to a group of medications called antihistamines. This medicine may be used for other purposes; ask your health care provider or pharmacist if you have questions. COMMON BRAND NAME(S): Anergan-50, Pentazine, Phenergan What should I tell my care team before I take this medication? They need to know if you have any of these conditions: Blockage in your bowel Diabetes Glaucoma Have trouble controlling your muscles Heart disease Liver disease Low blood counts, like low white cell, platelet, or red cell counts Lung or breathing disease, like asthma Parkinson's disease Prostate disease Seizures Stomach or intestine problems Trouble passing urine An unusual or allergic reaction to promethazine, sulfites, other medications, foods, dyes, or preservatives Pregnant or trying to get pregnant Breast-feeding How should I use this medication? This medication is for injection into a muscle, or into a vein. It is given in a hospital or clinic setting. Talk to your care team about the use of this medication in children. This medication should not be given to infants and children younger than 23 years old. Overdosage: If you think you have taken too much of this medicine contact a poison control center or emergency room at once. NOTE: This medicine is only for you. Do not share this medicine with others. What if I miss a dose? This does not apply. What may interact with this medication? Alcohol Antihistamines  for allergy, cough, and cold Atropine Certain medications for anxiety or sleep Certain medications for bladder problems like oxybutynin, tolterodine Certain medications for depression like amitriptyline, fluoxetine, sertraline Certain medications for Parkinson's disease like benztropine, trihexyphenidyl Certain medications for stomach problems like dicyclomine, hyoscyamine Certain medications for travel sickness like scopolamine Epinephrine General anesthetics like halothane, isoflurane, methoxyflurane, propofol Ipratropium MAOIs like Marplan, Nardil, and Parnate Medications for high blood pressure Medications for seizures like phenobarbital, primidone, phenytoin Medications that relax muscles for surgery Metoclopramide Narcotic medications for pain This list may not describe all possible interactions. Give your health care provider a list of all the medicines, herbs, non-prescription drugs, or dietary supplements you use. Also tell them if you smoke, drink alcohol, or use illegal drugs. Some items may interact with your medicine. What should I watch for while using this medication? Your condition will be monitored carefully while you are receiving this medication. Your care team will discuss with you the risks and the benefits of using this medication. This medication has caused serious side effects in some patients after it was injected into a vein. Watch closely for any signs or symptoms of a local reaction like burning, pain, redness, swelling, and blistering and tell your care team immediately if any occur. These symptoms may occur when you receive the injection or may occur hours or even days after the injection. You may get drowsy or dizzy. Do not drive, use machinery, or do anything that needs mental alertness until you know how this medication affects you. To reduce the risk of dizzy or fainting spells, do not stand or sit up quickly, especially if you are an older patient. Alcohol may  increase dizziness and drowsiness. Avoid alcoholic drinks. Your mouth may get dry. Chewing sugarless gum or sucking hard candy, and drinking plenty of water will help. This medication may cause dry eyes and blurred vision. If you wear contact lenses you may feel some discomfort. Lubricating drops may help. See your care team if the problem does not go away or is severe. Keep out of the sun, or wear protective clothing outdoors and use a sunscreen. Do not use sun lamps or sun tanning beds or booths. This medication may increase blood sugar. Ask your care team if changes in diet or medications are needed if you have diabetes. What side effects may I notice from receiving this medication? Side effects that you should report to your care team as soon as possible: Allergic reactions-skin rash, itching, hives, swelling of the face, lips, tongue, or throat CNS depression-slow or shallow breathing, shortness of breath, feeling faint, dizziness, confusion, trouble staying awake High fever stiff muscles, increased sweating, fast or irregular heartbeat, and confusion, which may be signs of neuroleptic malignant syndrome Infection-fever, chills, cough, or sore throat Liver injury-right upper belly pain, loss of appetite, nausea, light-colored stool, dark yellow or brown urine, yellowing skin or eyes, unusual weakness or fatigue Pain, redness, or irritation at injection site Seizures Sudden eye pain or change in vision such as blurry vision, seeing halos around lights, vision loss Trouble passing urine Uncontrolled and repetitive body movements, muscle stiffness or spasms, tremors or shaking, loss of balance or coordination, restlessness, shuffling walk, which may be signs of extrapyramidal symptoms (EPS) Side effects that usually do not require medical attention (report to your care team if they continue or are bothersome): Confusion Constipation Dizziness Drowsiness Dry mouth Sensitivity to light Vivid  dreams or nightmares This list may not describe all possible side effects. Call your doctor for medical advice about side effects. You may report side effects to FDA at 1-800-FDA-1088. Where should I keep my medication? This medication is given in a hospital or clinic and will not be stored at home. NOTE: This sheet is a summary. It may not cover all possible information. If you have questions about this medicine, talk to your doctor, pharmacist, or health care provider.  2022 Elsevier/Gold Standard (2020-05-14 10:25:40) Chronic Migraine Headache A migraine is a type of headache that is usually stronger and more sudden than other headaches. Migraines are characterized by an intense pulsing, throbbing pain that is usually only present on one side of the head. Migraine pain usually gets worse with activity. Migraines can cause nausea, vomiting, sensitivity to light and sound, and vision changes. Migraines that keep coming back are called recurrent migraines. A migraine is called a chronic migraine if it happens at least 15 days in a month for more than 3 months. Talk with your health care provider about what things may bring on (trigger) your migraines. What are the causes? The exact cause of this condition is not known. However, a migraine may be caused when nerves in the brain become irritated and release chemicals that cause inflammation of blood vessels. The inflammation of the blood vessels causes pain. Migraines may be triggered or caused by: Smoking. Certain foods and drinks, such as: Aged cheese. Chocolate. Alcohol. Caffeine. Foods or drinks that contain nitrates, glutamate, aspartame, MSG, or tyramine. Medicines, such as birth control pills or some blood pressure medicines. Other things that may trigger a migraine include: Menstruation. Emotional stress. Lack of sleep or too much sleep. Tiredness (fatigue). Bright lights or loud  noises. Odors. Weather changes and high  altitude. What increases the risk? The following factors may make you more likely to experience chronic migraine: Having migraines or a family history of migraines. Having a mental health condition, such as depression or anxiety. Having to take a lot of pain medicine. Having sleep problems. Having heart disease, diabetes, or obesity. What are the signs or symptoms? Symptoms of a migraine vary for each person and may include: Pulsating or throbbing pain. Pain that is usually only present on one side of the head. In some cases, the pain may be on both sides of the head or around the head or neck. Severe pain that prevents you from doing daily activities. Pain that gets worse with physical activity. Nausea, vomiting, or both. Pain with exposure to bright lights, loud noises, or activity. General sensitivity to bright lights, loud noises, or smells. Dizziness. A sign that a migraine is becoming chronic is an increasing number of migraine episodes. It is considered chronic if the migraine happens at least 15 days in a month for more than 3 months. How is this diagnosed? This condition is often diagnosed based on: Your symptoms and medical history. A physical exam. You may also have tests, including: A CT scan or an MRI of your brain. These imaging tests cannot diagnose migraines, but they can help to rule out other causes of headaches. Taking fluid from the spine (lumbar puncture) and analyzing it (cerebrospinal fluid analysis, or CSF analysis). Blood tests. How is this treated? This condition is treated with: Medicines. These help to: Lessen pain and nausea. Prevent migraines. Lifestyle changes, such as changes to your diet or sleeping patterns. Behavior therapy. This may include: Relaxation training. Biofeedback. This is a treatment that teaches you to relax and use your brain to lower your heart rate and control your breathing. Cognitive behavioral therapy (CBT). This is a form of  talk therapy. This therapy helps you set goals and follow up on the changes that you make. Acupuncture. Using a device that provides electrical stimulation to your nerves, which can relieve pain (neuromodulation therapy). Surgery, if the other treatments are not working. Follow these instructions at home: Medicines Take over-the-counter and prescription medicines only as told by your health care provider. Ask your health care provider if the medicine prescribed to you requires you to avoid driving or using machinery. Lifestyle  Do not use any products that contain nicotine or tobacco, such as cigarettes, e-cigarettes, and chewing tobacco. If you need help quitting, ask your health care provider. Do not drink alcohol. Get 7-9 hours of sleep each night, or the amount of sleep recommended by your health care provider. Find ways to manage stress, such as meditation, deep breathing, or yoga. Maintain a healthy weight. If you need help losing weight, ask your health care provider. Exercise regularly. Aim for 150 minutes of moderate-intensity exercise, such as walking, biking, or yoga, or 75 minutes of vigorous exercise each week. Vigorous exercise includes running, circuit training, and swimming. General instructions  Keep a journal to find out what triggers your migraines so you can avoid these triggers. For example, write down: What you eat and drink. How much sleep you get. Any change to your diet or medicines. Lie down in a dark, quiet room when you have a migraine. Try placing a cool towel over your head when you have a migraine. Keep lights dim, if bright lights bother you or make your migraines worse. Keep all follow-up visits as told by your  health care provider. This is important. Where to find more information Coalition for Headache and Migraine Patients (CHAMP): headachemigraine.org American Migraine Foundation: americanmigrainefoundation.org National Headache Foundation:  headaches.org Contact a health care provider if: Your pain does not improve, even with medicine. Your migraines continue to return, even with medicine. Get help right away if: Your migraine becomes severe and medicine does not help. You have a stiff neck and fever. You have a loss of vision. You have muscle weakness or loss of muscle control. You start losing your balance, or you have trouble walking. You feel like you may faint, or you faint. You start having sudden and unexpected, severe headaches. You have a seizure. Summary Migraine headaches are usually stronger and more sudden than other headaches. Migraines are characterized by an intense pulsing, throbbing pain that is usually only present on one side of the head. Migraines that keep coming back are called recurrent migraines. A migraine is called a chronic migraine if it happens 15 days in a month for more than 3 months. Certain things may trigger migraines, such as lack of sleep or too much sleep, smoking, certain foods, alcohol, stress, and certain medicines. Your treatment plan may include medicines, lifestyle changes, and behavior therapy. This information is not intended to replace advice given to you by your health care provider. Make sure you discuss any questions you have with your health care provider. Document Revised: 03/22/2019 Document Reviewed: 03/22/2019 Elsevier Patient Education  [place] Seizures, Adult A non-epileptic seizure is an event that can cause abnormal movements or a loss of consciousness. These events differ from epileptic seizures because they are not caused by abnormal electrical and chemical activity in the brain. There are two types of non-epileptic seizures: Physiologic. This type results from an underlying problem with body function. Psychogenic. This type results from an underlying mental health disorder. What are the causes? The cause of this condition depends on the  kind of non-epileptic seizure that you have. Causes of physiologic non-epileptic seizures Sudden drop in blood pressure or heart rhythm problems. Low blood sugar (glucose) or low levels of salt (sodium) in your blood. Migraine. Sleep disorders or movement disorders. Certain medicines. Heavy use of drugs or alcohol. Head injuries. Causes of psychogenic non-epileptic seizures Stress. Emotional trauma. Sexual or physical abuse. Big life events, such as divorce or death of a loved one. Mental health disorders, including anxiety and depression. What are the signs or symptoms? Symptoms of a non-epileptic seizure can be similar to those of an epileptic seizure. They may include: A change in attention or behavior. A loss of consciousness or fainting. Uncontrollable shaking (convulsions) with fast, jerking movements. Drooling, tongue biting, or grunting. Rapid eye movements. Being unable to control when you urinate or have bowel movements. After a non-epileptic seizure, you may: Have a headache or sore muscles. Feel confused or sleepy. Non-epileptic seizures usually: Do not cause physical injuries. Start slowly. Include crying or shrieking. Last longer than 2 minutes. Include pelvic thrusting. How is this diagnosed? Non-epileptic seizures may be diagnosed by medical history, physical exam, and symptoms. Your health care provider may: Talk with your friends or relatives who have seen you have a seizure. Ask you to write down your seizure activity and the things that led up to the seizure, and ask you to share that information with him or her. You may also have tests to look for causes of physiologic non-epileptic seizures. Tests may include: An electroencephalogram (EEG) to check the electrical activity in  your brain. Video EEG in the hospital. This takes 2-7 days. Blood tests. An electrocardiogram (ECG) to check for an abnormal heart rhythm. A CT scan. If your health care provider  thinks you have had a psychogenic non-epileptic seizure, you may need to be evaluated by a mental health specialist. How is this treated? The treatment for your non-epileptic seizures will depend on their cause. When the underlying condition is treated, your non-epileptic seizures should stop. Medicines to treat seizures do not help with non-epileptic events. If your non-epileptic seizures are being caused by emotional trauma or stress, your health care provider may recommend that you see a mental health specialist. Treatment may include: Relaxation therapy or cognitive behavioral therapy (CBT). Medicines for depression or anxiety. Individual or family counseling. Some people have both psychogenic non-epileptic seizures and epileptic seizures. If you have both of these types, you may be prescribed medicine to manage the epileptic seizures. Follow these instructions at home: Home care will depend on the type of non-epileptic seizures that you have. Follow this general guidance: Avoid alcohol and drug use Avoid using any substance that may prevent your medicine from working properly. If you are prescribed medicine for seizures: Do not use drugs. Limit or avoid drinking alcohol. If you drink alcohol: Limit how much you have to: 0-1 drink a day for women who are not pregnant. 0-2 drinks a day for men. Know how much alcohol is in your drink. In the [place] one drink equals one 12 oz bottle of beer (355 mL), one 5 oz glass of wine (148 mL), or one 1 oz glass of hard liquor (44 mL). Involve family, friends, and coworkers Make sure family members, friends, and coworkers are trained in how to help you if you have a seizure. If you have a seizure, they should: Lay you on the ground to prevent a fall. Place a pillow or piece of clothing under your head. Loosen any clothing around your neck. Turn you onto your side. If you vomit, this position will help keep your windpipe clear. General  instructions Follow all instructions from your health care provider. These may include ways to prevent seizures and what to do if you have a seizure. Take over-the-counter and prescription medicines only as told by your health care provider. Keep all follow-up visits. This is important. Contact a health care provider if: Your non-epileptic seizures change or happen more often. Your non-epileptic seizures do not stop after treatment. Get help right away if: You injure yourself during a non-epileptic seizure. You have one non-epileptic seizure after another. You have trouble recovering from a non-epileptic seizure. You have chest pain or trouble breathing. You have a non-epileptic seizure that lasts longer than 5 minutes. These symptoms may represent a serious problem that is an emergency. Do not wait to see if the symptoms will go away. Get medical help right away. Call your local emergency services (911 in the [place] Do not drive yourself to the hospital. If you ever feel like you may hurt yourself or others, or have thoughts about taking your own life, get help right away. Go to your nearest emergency department or: Call your local emergency services (911 in the [place] Call a suicide crisis helpline, such as the [facility] at 479 608 5907. This is open 24 hours a day in the [place] Text the Crisis Text Line at (440)253-4144 (in the [place].). Summary Non-epileptic seizures are events that can cause abnormal movements or a loss of consciousness. These events are caused  by an underlying problem with body function or a mental health disorder. The treatment for your non-epileptic seizures will depend on their cause. When the underlying condition is treated, your non-epileptic seizures should stop. Medicines for seizures do not treat non-epileptic events. People with non-epileptic seizures may also have epileptic seizures and may need medicines to treat seizures. Make sure family  members, friends, and coworkers are trained in how to help you if you have a non-epileptic seizure. This includes laying you on the ground to prevent a fall, protecting your head and neck, and turning you onto your side. This information is not intended to replace advice given to you by your health care provider. Make sure you discuss any questions you have with your health care provider. Document Revised: 07/21/2019 Document Reviewed: 07/21/2019 Elsevier Patient Education  [place].
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
## 2020-10-23 NOTE — Telephone Encounter (Signed)
[employer] medicare order sent to GI. NPR they will reach out to the patient to schedule.  

## 2020-10-24 NOTE — Progress Notes (Signed)
CBC and CMET are normal for this patient, no significant changes to previous tests. Liver function is normal !

## 2020-10-25 LAB — COMPREHENSIVE METABOLIC PANEL
ALT: 26 IU/L (ref 0–32)
AST: 20 IU/L (ref 0–40)
Albumin/Globulin Ratio: 2 (ref 1.2–2.2)
Albumin: 4.3 g/dL (ref 3.8–4.8)
Alkaline Phosphatase: 73 IU/L (ref 44–121)
BUN/Creatinine Ratio: 8 — ABNORMAL LOW (ref 9–23)
BUN: 7 mg/dL (ref 6–24)
Bilirubin Total: 0.2 mg/dL (ref 0.0–1.2)
CO2: 20 mmol/L (ref 20–29)
Calcium: 9.6 mg/dL (ref 8.7–10.2)
Chloride: 107 mmol/L — ABNORMAL HIGH (ref 96–106)
Creatinine, Ser: 0.83 mg/dL (ref 0.57–1.00)
Globulin, Total: 2.2 g/dL (ref 1.5–4.5)
Glucose: 79 mg/dL (ref 65–99)
Potassium: 4.9 mmol/L (ref 3.5–5.2)
Sodium: 142 mmol/L (ref 134–144)
Total Protein: 6.5 g/dL (ref 6.0–8.5)
eGFR: 89 mL/min/{1.73_m2} (ref >59)

## 2020-10-25 LAB — CBC WITH DIFFERENTIAL/PLATELET
Basophils Absolute: 0 10*3/uL (ref 0.0–0.2)
Basos: 1 %
EOS (ABSOLUTE): 0.1 10*3/uL (ref 0.0–0.4)
Eos: 2 %
Hematocrit: 47.8 % — ABNORMAL HIGH (ref 34.0–46.6)
Hemoglobin: 15.9 g/dL (ref 11.1–15.9)
Immature Grans (Abs): 0 10*3/uL (ref 0.0–0.1)
Immature Granulocytes: 0 %
Lymphocytes Absolute: 3 10*3/uL (ref 0.7–3.1)
Lymphs: 49 %
MCH: 30.5 pg (ref 26.6–33.0)
MCHC: 33.3 g/dL (ref 31.5–35.7)
MCV: 92 fL (ref 79–97)
Monocytes Absolute: 0.3 10*3/uL (ref 0.1–0.9)
Monocytes: 5 %
Neutrophils Absolute: 2.5 10*3/uL (ref 1.4–7.0)
Neutrophils: 43 %
Platelets: 226 10*3/uL (ref 150–450)
RBC: 5.22 x10E6/uL (ref 3.77–5.28)
RDW: 12.3 % (ref 11.7–15.4)
WBC: 6 10*3/uL (ref 3.4–10.8)

## 2020-10-25 LAB — 10-HYDROXYCARBAZEPINE: Oxcarbazepine SerPl-Mcnc: <1 ug/mL — ABNORMAL LOW (ref 10–35)

## 2020-10-26 NOTE — Progress Notes (Signed)
Oxcarbazepine ( metabolite of Trileptal ) was not found in the bloodstream.

## 2020-11-05 ENCOUNTER — Inpatient Hospital Stay: Admission: RE | Admit: 2020-11-05 | Payer: Medicare (Managed Care) | Source: Ambulatory Visit

## 2020-11-20 ENCOUNTER — Encounter: Payer: Medicare (Managed Care) | Admitting: Family Medicine

## 2020-11-25 ENCOUNTER — Encounter: Payer: Medicare Other | Admitting: Family Medicine

## 2020-12-16 ENCOUNTER — Telehealth: Payer: Self-pay | Admitting: Neurology

## 2020-12-16 NOTE — Telephone Encounter (Signed)
PA completed on [facility] [employer] for the pt. EBR:[id number] Will await response

## 2020-12-16 NOTE — Telephone Encounter (Signed)
PA submitted through [facility] for [employer]. KEY: [id number]  [id number]. EMGALITY INJ 120MG /ML is approved through 02/15/2022. Your patient may now fill this prescription and it will be covered.

## 2020-12-17 NOTE — Telephone Encounter (Signed)
[id number]. UBRELVY TAB 100MG  is approved through 02/15/2022.

## 2020-12-17 NOTE — Telephone Encounter (Signed)
This PA was cancelled.  PA resubmitted through [facility] today. KEY: [id number] Will await 24-72 hrs for a decision

## 2021-01-13 ENCOUNTER — Encounter: Payer: Medicare Other | Admitting: Obstetrics & Gynecology

## 2021-03-10 ENCOUNTER — Other Ambulatory Visit (HOSPITAL_COMMUNITY): Payer: Self-pay | Admitting: Psychiatry

## 2021-04-16 ENCOUNTER — Encounter: Payer: Medicare (Managed Care) | Admitting: Obstetrics and Gynecology

## 2021-05-27 ENCOUNTER — Ambulatory Visit: Payer: Medicare Other | Admitting: Registered Nurse

## 2021-06-02 ENCOUNTER — Ambulatory Visit: Payer: Medicare Other | Admitting: Registered Nurse

## 2021-06-03 ENCOUNTER — Ambulatory Visit: Payer: Medicare Other | Admitting: Registered Nurse

## 2021-06-10 ENCOUNTER — Ambulatory Visit: Payer: Medicare Other | Admitting: Registered Nurse

## 2021-06-17 ENCOUNTER — Ambulatory Visit: Payer: Medicare Other | Admitting: Registered Nurse

## 2021-07-09 ENCOUNTER — Encounter: Payer: Medicare Other | Admitting: Obstetrics and Gynecology

## 2021-07-16 ENCOUNTER — Ambulatory Visit: Payer: Medicare Other | Admitting: Registered Nurse

## 2021-07-17 ENCOUNTER — Telehealth: Payer: Medicare Other

## 2021-08-06 ENCOUNTER — Encounter: Payer: Medicare (Managed Care) | Admitting: Obstetrics & Gynecology

## 2021-08-26 ENCOUNTER — Ambulatory Visit: Payer: Medicare Other | Admitting: Registered Nurse

## 2021-08-27 ENCOUNTER — Ambulatory Visit: Payer: Medicare Other | Admitting: Neurology

## 2021-09-22 ENCOUNTER — Ambulatory Visit: Payer: Medicare Other | Admitting: Internal Medicine

## 2021-09-30 ENCOUNTER — Ambulatory Visit: Payer: Medicare Other | Admitting: Neurology

## 2021-10-23 ENCOUNTER — Other Ambulatory Visit (HOSPITAL_COMMUNITY)
Admission: RE | Admit: 2021-10-23 | Discharge: 2021-10-23 | Disposition: A | Discharge status: Home / Self Care | Payer: Medicare Other | Source: Ambulatory Visit | Attending: Family Medicine | Admitting: Family Medicine

## 2021-10-23 ENCOUNTER — Ambulatory Visit (INDEPENDENT_AMBULATORY_CARE_PROVIDER_SITE_OTHER): Payer: Medicare Other | Admitting: Family Medicine

## 2021-10-23 ENCOUNTER — Encounter: Payer: Self-pay | Admitting: Family Medicine

## 2021-10-23 ENCOUNTER — Other Ambulatory Visit: Payer: Self-pay

## 2021-10-23 VITALS — BP 122/88 | HR 84 | Wt 182.8 lb

## 2021-10-23 DIAGNOSIS — Z1151 Encounter for screening for human papillomavirus (HPV): Secondary | ICD-10-CM | POA: Insufficient documentation

## 2021-10-23 DIAGNOSIS — Z01411 Encounter for gynecological examination (general) (routine) with abnormal findings: Secondary | ICD-10-CM | POA: Insufficient documentation

## 2021-10-23 DIAGNOSIS — N939 Abnormal uterine and vaginal bleeding, unspecified: Secondary | ICD-10-CM | POA: Insufficient documentation

## 2021-10-23 DIAGNOSIS — N6314 Unspecified lump in the right breast, lower inner quadrant: Secondary | ICD-10-CM

## 2021-10-23 DIAGNOSIS — Z124 Encounter for screening for malignant neoplasm of cervix: Secondary | ICD-10-CM | POA: Diagnosis not present

## 2021-10-23 DIAGNOSIS — F319 Bipolar disorder, unspecified: Secondary | ICD-10-CM | POA: Diagnosis not present

## 2021-10-23 DIAGNOSIS — Z23 Encounter for immunization: Secondary | ICD-10-CM

## 2021-10-23 DIAGNOSIS — R109 Unspecified abdominal pain: Secondary | ICD-10-CM

## 2021-10-23 DIAGNOSIS — F3170 Bipolar disorder, currently in remission, most recent episode unspecified: Secondary | ICD-10-CM

## 2021-10-23 DIAGNOSIS — N858 Other specified noninflammatory disorders of uterus: Secondary | ICD-10-CM | POA: Diagnosis not present

## 2021-10-23 MED ORDER — IBUPROFEN 800 MG PO TABS
800.0000 mg | ORAL_TABLET | Freq: Once | ORAL | Status: AC
  Administered 2021-10-23: 800 mg via ORAL

## 2021-10-23 NOTE — Progress Notes (Signed)
Mb ref to int beh haSubjective:     Kristin Braun is a 46 y.o. female and is here for a comprehensive physical exam. The patient reports problems - breast lump and abnl uterine bleeding . Reports lump in breast x 3 months. Has h/o fibrocystic breast disease. No pain. Abnormal uterine bleeding, ongoing x 5 years. Normal u/s, on megace did not have f/u. Stopped this about 2 years ago. Bleeds more days than not, but not currenly bleeding. Has pain on left side, sometimes cyclical and sometimes radiates down her leg.   The following portions of the patient's history were reviewed and updated as appropriate: allergies, current medications, past family history, past medical history, past social history, past surgical history, and problem list.  Review of Systems Pertinent items noted in HPI and remainder of comprehensive ROS otherwise negative.   Objective:    BP 122/88   Pulse 84   Wt 182 lb 12.8 oz (82.9 kg)   BMI 31.38 kg/m  General appearance: alert, cooperative, and appears stated age Head: Normocephalic, without obvious abnormality, atraumatic Neck: no adenopathy, supple, symmetrical, trachea midline, and thyroid not enlarged, symmetric, no tenderness/mass/nodules Lungs: clear to auscultation bilaterally Breasts:  fibrocystic change throughout, small mobile firm mass at 12 o'clock just above areola on right. Heart: regular rate and rhythm, S1, S2 normal, no murmur, click, rub or gallop Abdomen: soft, non-tender; bowel sounds normal; no masses,  no organomegaly Pelvic: cervix normal in appearance, external genitalia normal, no adnexal masses or tenderness, no cervical motion tenderness, uterus normal size, shape, and consistency, and vagina normal without discharge Extremities: extremities normal, atraumatic, no cyanosis or edema Pulses: 2+ and symmetric Skin: Skin color, texture, turgor normal. No rashes or lesions Lymph nodes: Cervical, supraclavicular, and axillary nodes  normal. Neurologic: Grossly normal    Procedure: Patient given informed consent, signed copy in the chart, time out was performed. Appropriate time out taken. . The patient was placed in the lithotomy position and the cervix brought into view with sterile speculum.  Portio of cervix cleansed x 2 with betadine swabs.  A tenaculum was placed in the anterior lip of the cervix.  The uterus was sounded for depth of 7 cm. A pipelle was introduced to into the uterus, suction created,  and an endometrial sample was obtained. All equipment was removed and accounted for.  The patient tolerated the procedure well.  Assessment:    GYN female exam.      Plan:  Needs flu shot - [phone] - Plan: Flu Vaccine QUAD 78moIM (Fluarix, Fluzone & Alfiuria Quad PF)  Screening for malignant neoplasm of cervix -- 46270- Plan: Cytology - PAP( [place])  Encounter for gynecological examination with abnormal finding - 99395  Mass of lower inner quadrant of right breast - 99214 - referral for diag mammo + u/s - Plan: MM DIAG BREAST TOMO BILATERAL, UKoreaBREAST LTD UNI RIGHT INC AXILLA  Abnormal uterine bleeding - 99214 -  EMB today, f/u u/s and then treatment options based on findings. She is s/p SVD x 3 and BTL - Plan: [place]PELVIC COMPLETE WITH TRANSVAGINAL, Surgical [place]( [place], CANCELED: Surgical pathology( [place]/ [place]  Bipolar I disorder ([place] -- 35009- to see JRoselyn Reeffor referral for treatment with psych professional - Plan: [place]ulatory [place] to [place] Abdominal cramping - following EMB - Plan: ibuprofen (ADVIL) tablet 800 mg  Return in 1 year (on 10/24/2022).    See After Visit Summary for Counseling Recommendations

## 2021-10-27 ENCOUNTER — Encounter: Payer: Self-pay | Admitting: Family Medicine

## 2021-10-27 LAB — SURGICAL PATHOLOGY

## 2021-10-28 ENCOUNTER — Encounter
Admission: RE | Admit: 2021-10-28 | Discharge: 2021-10-28 | Disposition: A | Discharge status: Home / Self Care | Payer: Medicare Other | Source: Ambulatory Visit | Attending: Family Medicine | Admitting: Family Medicine

## 2021-10-28 DIAGNOSIS — N939 Abnormal uterine and vaginal bleeding, unspecified: Secondary | ICD-10-CM | POA: Diagnosis present

## 2021-10-29 LAB — CYTOLOGY - PAP
Comment: NEGATIVE
Diagnosis: NEGATIVE
Diagnosis: REACTIVE
High risk HPV: NEGATIVE

## 2021-10-29 NOTE — BH Specialist Note (Signed)
Integrated Behavioral Health via Telemedicine Visit  10/30/2021 [name] [id number]  Number of [place] Clinician visits: 1- Initial Visit  Session Start time: [phone]   Session End time: 0912  Total time in minutes: 25   Referring Provider: Darron Doom, MD Patient/Family location: Home [facility] Provider location: [facility] for [place] [facility]  All persons participating in visit: [name] and [place]   Types of Service: Individual psychotherapy and Video visit  I connected with Kristin Braun and/or Kristin Braun  n/a  via  Telephone or Video Enabled Telemedicine Application  (Video is Caregility application) and verified that I am speaking with the correct person using two identifiers. Discussed confidentiality: Yes   I discussed the limitations of telemedicine and the availability of in person appointments.  Discussed there is a possibility of technology failure and discussed alternative modes of communication if that failure occurs.  I discussed that engaging in this telemedicine visit, they consent to the provision of behavioral healthcare and the services will be billed under their insurance.  Patient and/or legal guardian expressed understanding and consented to Telemedicine visit: Yes   Presenting Concerns: Patient and/or family reports the following symptoms/concerns: Going into manic episode without BH medication previously prescribed by Dr. Dayna Ramus after he retired.  Pt is going on four days with no sleep; history of psychosis at times; financial stress. Pt denies current SI, no intent and no plan. Duration of problem: Ongoing, increase while unmedicated; Severity of problem: severe  Patient and/or Family's Strengths/Protective Factors: Concrete supports in place (healthy food, safe environments, etc.) and Sense of purpose  Goals Addressed: Patient will:  Reduce symptoms of:  mood instability and stress   Increase knowledge and/or ability of: stress reduction   Demonstrate ability to: Increase adequate support systems for patient/family and Increase motivation to adhere to plan of care  Progress towards Goals: Ongoing  Interventions: Interventions utilized:  Psychoeducation and/or Health Education, Supportive Reflection, and Safety Standardized Assessments completed: Not Needed  Patient and/or Family Response: Patient agrees with treatment plan.   Assessment: Patient currently experiencing Bipolar 1 Disorder; and Psychosocial stress.   Patient may benefit from psychoeducation and brief therapeutic interventions regarding coping with symptoms of mood instability .  Plan: Follow up with behavioral health clinician on : Two week follow up Behavioral recommendations:  -Go to walk-in [facility] as soon as possible (Urgent Care) -Accept referral to [facility]): [place] (In Clinic)  I discussed the assessment and treatment plan with the patient and/or parent/guardian. They were provided an opportunity to ask questions and all were answered. They agreed with the plan and demonstrated an understanding of the instructions.   They were advised to call back or seek an in-person evaluation if the symptoms worsen or if the condition fails to improve as anticipated.  [name], LCSW     10/23/2021    5:04 PM 04/13/2018    8:49 AM 03/30/2018   11:06 AM 09/29/2017   11:05 AM 08/23/2017    9:19 AM  Depression screen PHQ 2/9  Decreased Interest '3 2 2 2 2  '$ Down, Depressed, Hopeless '3 2 2 2 2  '$ PHQ - 2 Score '6 4 4 4 4  '$ Altered sleeping '3 2 2 2 2  '$ Tired, decreased energy '3 1 1 1 2  '$ Change in appetite '3 1 1 1 1  '$ Feeling bad or failure about yourself  '3  1 1 1  '$ Trouble  concentrating 3 0 '2 1 1  '$ Moving slowly or fidgety/restless 3 0 0 0 0  Suicidal thoughts 2 0 0 0 0  PHQ-9 Score '26 8 11 10 11      '$ 10/23/2021    5:04 PM   GAD 7 : Generalized Anxiety Score  Nervous, Anxious, on Edge 3  Control/stop worrying 3  Worry too much - different things 3  Trouble relaxing 3  Restless 3  Easily annoyed or irritable 3  Afraid - awful might happen 3  Total GAD 7 Score 21

## 2021-10-30 ENCOUNTER — Ambulatory Visit (INDEPENDENT_AMBULATORY_CARE_PROVIDER_SITE_OTHER): Payer: Medicare Other | Admitting: Clinical

## 2021-10-30 ENCOUNTER — Ambulatory Visit
Admission: RE | Admit: 2021-10-30 | Discharge: 2021-10-30 | Disposition: A | Discharge status: Home / Self Care | Payer: Medicare Other | Source: Ambulatory Visit | Attending: Family Medicine | Admitting: Family Medicine

## 2021-10-30 DIAGNOSIS — Z658 Other specified problems related to psychosocial circumstances: Secondary | ICD-10-CM

## 2021-10-30 DIAGNOSIS — N6452 Nipple discharge: Secondary | ICD-10-CM | POA: Diagnosis not present

## 2021-10-30 DIAGNOSIS — F319 Bipolar disorder, unspecified: Secondary | ICD-10-CM

## 2021-10-30 DIAGNOSIS — N6314 Unspecified lump in the right breast, lower inner quadrant: Secondary | ICD-10-CM

## 2021-11-03 ENCOUNTER — Ambulatory Visit: Payer: Medicare Other | Admitting: Neurology

## 2021-11-03 ENCOUNTER — Telehealth: Payer: Self-pay | Admitting: Neurology

## 2021-11-03 NOTE — Telephone Encounter (Signed)
Pt cancelled appt due to in [place] will  not get back in time for appt.

## 2021-11-03 NOTE — BH Specialist Note (Signed)
Integrated Behavioral Health via Telemedicine Visit  11/13/2021 Kristin Braun [id number]  Number of Integrated Behavioral Health Clinician visits: 2- Second Visit  Session Start time: 9924   Session End time: 1110  Total time in minutes: 9   Referring Provider: Darron Doom, MD Patient/Family location: Home [facility] Provider location: [facility] [place] [facility]  All persons participating in visit: [name] and Kristin Braun   Types of Service: Individual psychotherapy  I connected with Kristin Braun and/or Kristin Braun  n/a  via  Telephone or Video Enabled Telemedicine Application  (Video is Caregility application) and verified that I am speaking with the correct person using two identifiers. Discussed confidentiality: Yes   I discussed the limitations of telemedicine and the availability of in person appointments.  Discussed there is a possibility of technology failure and discussed alternative modes of communication if that failure occurs.  I discussed that engaging in this telemedicine visit, they consent to the provision of behavioral healthcare and the services will be billed under their insurance.  Patient and/or legal guardian expressed understanding and consented to Telemedicine visit: Yes   Presenting Concerns: Patient and/or family reports the following symptoms/concerns: Currently in manic episode with racing thoughts and speech, able to take very brief naps on ZQuil the past two weeks, plans to walk in to [facility] after medical appointment today. Duration of problem: Ongoing; Severity of problem: severe  Patient and/or Family's Strengths/Protective Factors: Concrete supports in place (healthy food, safe environments, etc.) and Sense of purpose  Goals Addressed: Patient will:  Reduce symptoms of: mood instability and stress   Progress towards Goals: Ongoing  Interventions: Interventions utilized:   Supportive Reflection Standardized Assessments completed: Not Needed  Patient and/or Family Response: Patient agrees with treatment plan.   Assessment: Patient currently experiencing Bipolar 1 Disorder; Psychosocial stress.   Patient may benefit from continued brief therapeutic interventions.  Plan: Follow up with behavioral health clinician on : Two weeks Behavioral recommendations:  -Go to [facility] today, immediately after medical appointment -At medical appointment today, pick up [place] bag Referral(s): [place] (In Clinic) and [employer] Resources:  Food  I discussed the assessment and treatment plan with the patient and/or parent/guardian. They were provided an opportunity to ask questions and all were answered. They agreed with the plan and demonstrated an understanding of the instructions.   They were advised to call back or seek an in-person evaluation if the symptoms worsen or if the condition fails to improve as anticipated.  Kristin Fair, LCSW     10/23/2021    5:04 PM 04/13/2018    8:49 AM 03/30/2018   11:06 AM 09/29/2017   11:05 AM 08/23/2017    9:19 AM  Depression screen PHQ 2/9  Decreased Interest '3 2 2 2 2  '$ Down, Depressed, Hopeless '3 2 2 2 2  '$ PHQ - 2 Score '6 4 4 4 4  '$ Altered sleeping '3 2 2 2 2  '$ Tired, decreased energy '3 1 1 1 2  '$ Change in appetite '3 1 1 1 1  '$ Feeling bad or failure about yourself  '3  1 1 1  '$ Trouble concentrating 3 0 '2 1 1  '$ Moving slowly or fidgety/restless 3 0 0 0 0  Suicidal thoughts 2 0 0 0 0  PHQ-9 Score '26 8 11 10 11      '$ 10/23/2021    5:04 PM  GAD 7 : Generalized Anxiety Score  Nervous, Anxious,  on Edge 3  Control/stop worrying 3  Worry too much - different things 3  Trouble relaxing 3  Restless 3  Easily annoyed or irritable 3  Afraid - awful might happen 3  Total GAD 7 Score 21

## 2021-11-13 ENCOUNTER — Ambulatory Visit (INDEPENDENT_AMBULATORY_CARE_PROVIDER_SITE_OTHER): Payer: Medicare Other | Admitting: Family Medicine

## 2021-11-13 ENCOUNTER — Encounter: Payer: Self-pay | Admitting: Family Medicine

## 2021-11-13 ENCOUNTER — Ambulatory Visit (INDEPENDENT_AMBULATORY_CARE_PROVIDER_SITE_OTHER): Payer: Medicare Other | Admitting: Clinical

## 2021-11-13 VITALS — BP 135/90 | HR 91 | Wt 181.6 lb

## 2021-11-13 DIAGNOSIS — F319 Bipolar disorder, unspecified: Secondary | ICD-10-CM

## 2021-11-13 DIAGNOSIS — N939 Abnormal uterine and vaginal bleeding, unspecified: Secondary | ICD-10-CM

## 2021-11-13 DIAGNOSIS — N3946 Mixed incontinence: Secondary | ICD-10-CM | POA: Diagnosis not present

## 2021-11-13 DIAGNOSIS — Z658 Other specified problems related to psychosocial circumstances: Secondary | ICD-10-CM

## 2021-11-13 DIAGNOSIS — Z5941 Food insecurity: Secondary | ICD-10-CM | POA: Diagnosis not present

## 2021-11-13 MED ORDER — ESTRADIOL 10 MCG VA TABS: 1.0000 | ORAL_TABLET | Freq: Every day | VAGINAL | 12 refills | Status: DC

## 2021-11-13 MED ORDER — NORETHINDRONE ACETATE 5 MG PO TABS: 10.0000 mg | ORAL_TABLET | Freq: Every day | ORAL | 1 refills | Status: DC

## 2021-11-13 NOTE — Assessment & Plan Note (Addendum)
Given this + desire for hyst, will refer to Uro/Gyn Trial of vaginal estrogen to help with urinary urgency.

## 2021-11-13 NOTE — Patient Instructions (Signed)
[facility] [address] [place], [place] [address] [phone] (main office) [phone] (Zandra Lajeunesse's office)  [facility]  [address], [place], [place] [address] [phone] or [phone] WALK-IN URGENT CARE 24/7 FOR ANYONE [address], [place],   [phone] Fax: [phone] [email or web address] *Interpreters available *Accepts all insurance and uninsured for Urgent Care needs *Accepts Medicaid and uninsured for outpatient treatment (below)    ONLY FOR [place] Residents  Below:   Outpatient New Patient Assessment/Therapy Walk-ins:        Monday -Thursday 8am until slots are full.        Every Friday 1pm-4pm  (first come, first served)                   New Patient Psychiatry/Medication Management        Monday-Friday 8am-11am (first come, first served)              For all walk-ins we ask that you arrive by 7:15am, because patients will be seen in the order of arrival.     

## 2021-11-13 NOTE — Progress Notes (Signed)
   Subjective:    Patient ID: Kristin Braun is a 46 y.o. female presenting with No chief complaint on file.  on 11/13/2021  HPI: Having a lot of bleeding all the time. She has had this for 5 years and she has tried and failed Megace. She would like hysterectomy potentially. She also reports urinary loss with urge and with stress.  Has normal appearing pelvic sonogram. EMB and pap are WNL. Has h/o SVD x 3. Reports night sweats and hot flashes.   Review of Systems  Constitutional:  Negative for chills and fever.  Respiratory:  Negative for shortness of breath.   Cardiovascular:  Negative for chest pain.  Gastrointestinal:  Negative for abdominal pain, nausea and vomiting.  Genitourinary:  Negative for dysuria.  Skin:  Negative for rash.      Objective:    BP (!) 135/90   Pulse 91   Wt 181 lb 9.6 oz (82.4 kg)   LMP  (LMP Unknown)   BMI 31.17 kg/m  Physical Exam Exam conducted with a chaperone present.  Constitutional:      General: She is not in acute distress.    Appearance: She is well-developed.  HENT:     Head: Normocephalic and atraumatic.  Eyes:     General: No scleral icterus. Cardiovascular:     Rate and Rhythm: Normal rate.  Pulmonary:     Effort: Pulmonary effort is normal.  Abdominal:     Palpations: Abdomen is soft.  Musculoskeletal:     Cervical back: Neck supple.  Skin:    General: Skin is warm and dry.  Neurological:     Mental Status: She is alert and oriented to person, place, and time.         Assessment & Plan:   Problem List Items Addressed This Visit       Unprioritized   Mixed stress and urge urinary incontinence    Given this + desire for hyst, will refer to Uro/Gyn Trial of vaginal estrogen to help with urinary urgency.      Relevant Medications   Estradiol 10 MCG TABS vaginal tablet   Other Relevant Orders   Ambulatory referral to Urogynecology   Abnormal vaginal bleeding    Given options for oral meds, IUD, endometrial  ablation and hyst. R/B/A discussed at length. Will refer to Uro/Gyn for possible definitive surgery. Aygestin in the meantime to curb bleeding.      Relevant Medications   norethindrone (AYGESTIN) 5 MG tablet   Other Visit Diagnoses     Food insecurity    -  Primary   Relevant Orders   AMBULATORY REFERRAL TO [place]       No follow-ups on file.  Donnamae Jude, MD 11/13/2021 1:40 PM

## 2021-11-13 NOTE — Assessment & Plan Note (Addendum)
Given options for oral meds, IUD, endometrial ablation and hyst. R/B/A discussed at length. Will refer to Uro/Gyn for possible definitive surgery. Aygestin in the meantime to curb bleeding.

## 2021-11-19 ENCOUNTER — Ambulatory Visit: Payer: Medicare Other | Admitting: Neurology

## 2021-11-20 ENCOUNTER — Ambulatory Visit: Payer: Medicare Other | Admitting: Neurology

## 2021-11-24 NOTE — BH Specialist Note (Signed)
Pt did not arrive to video visit and did not answer the phone; Left HIPPA-compliant message to call back Ayauna Mcnay from [facility] at  [phone] (Verbie Babic's office).  ?; left MyChart message for patient.  ? ?

## 2021-11-27 NOTE — Progress Notes (Signed)
Erroneous encounter-disregard

## 2021-11-28 ENCOUNTER — Encounter: Payer: Medicare Other | Admitting: Family

## 2021-11-28 DIAGNOSIS — Z7689 Persons encountering health services in other specified circumstances: Secondary | ICD-10-CM

## 2021-12-01 ENCOUNTER — Ambulatory Visit: Payer: Medicare Other | Admitting: Clinical

## 2021-12-01 ENCOUNTER — Encounter: Payer: Self-pay | Admitting: Neurology

## 2021-12-01 ENCOUNTER — Ambulatory Visit: Payer: Medicare Other | Admitting: Neurology

## 2021-12-01 VITALS — BP 146/101 | HR 110 | Ht 63.0 in | Wt 183.0 lb

## 2021-12-01 DIAGNOSIS — R270 Ataxia, unspecified: Secondary | ICD-10-CM

## 2021-12-01 DIAGNOSIS — G4719 Other hypersomnia: Secondary | ICD-10-CM

## 2021-12-01 DIAGNOSIS — Z91199 Patient's noncompliance with other medical treatment and regimen due to unspecified reason: Secondary | ICD-10-CM

## 2021-12-01 DIAGNOSIS — R569 Unspecified convulsions: Secondary | ICD-10-CM

## 2021-12-01 DIAGNOSIS — G40209 Localization-related (focal) (partial) symptomatic epilepsy and epileptic syndromes with complex partial seizures, not intractable, without status epilepticus: Secondary | ICD-10-CM | POA: Diagnosis not present

## 2021-12-01 DIAGNOSIS — G43711 Chronic migraine without aura, intractable, with status migrainosus: Secondary | ICD-10-CM

## 2021-12-01 DIAGNOSIS — Z79899 Other long term (current) drug therapy: Secondary | ICD-10-CM

## 2021-12-01 DIAGNOSIS — R079 Chest pain, unspecified: Secondary | ICD-10-CM

## 2021-12-01 MED ORDER — CYCLOBENZAPRINE HCL 10 MG PO TABS: 10.0000 mg | ORAL_TABLET | Freq: Every day | ORAL | 2 refills | Status: DC
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
## 2021-12-01 NOTE — Addendum Note (Signed)
Addended by: Darleen Crocker on: 12/01/2021 04:24 PM   Modules accepted: Orders

## 2021-12-08 ENCOUNTER — Telehealth: Payer: Self-pay | Admitting: Neurology

## 2021-12-08 NOTE — Telephone Encounter (Signed)
HST- [employer] medicare no auth req.  Patient is scheduled at [facility] for 12/30/21 at 1:30 pm.  Mailed packet to the patient.

## 2021-12-09 ENCOUNTER — Other Ambulatory Visit: Payer: Self-pay | Admitting: Neurology

## 2021-12-09 ENCOUNTER — Encounter: Payer: Self-pay | Admitting: Neurology

## 2021-12-09 DIAGNOSIS — G40209 Localization-related (focal) (partial) symptomatic epilepsy and epileptic syndromes with complex partial seizures, not intractable, without status epilepticus: Secondary | ICD-10-CM

## 2021-12-09 MED ORDER — TOPIRAMATE 200 MG PO TABS: 200.0000 mg | ORAL_TABLET | Freq: Two times a day (BID) | ORAL | 1 refills | Status: DC

## 2021-12-09 MED ORDER — PROMETHAZINE HCL 12.5 MG PO TABS: 12.5000 mg | ORAL_TABLET | Freq: Three times a day (TID) | ORAL | 5 refills | Status: DC | PRN

## 2021-12-11 ENCOUNTER — Telehealth: Payer: Self-pay | Admitting: Neurology

## 2021-12-11 MED ORDER — UBRELVY 100 MG PO TABS: 100.0000 mg | ORAL_TABLET | ORAL | 1 refills | Status: DC | PRN

## 2021-12-11 MED ORDER — CYCLOBENZAPRINE HCL 10 MG PO TABS: 10.0000 mg | ORAL_TABLET | Freq: Every day | ORAL | 2 refills | Status: DC

## 2021-12-11 MED ORDER — BUTORPHANOL TARTRATE 10 MG/ML NA SOLN: 1.0000 | Freq: Two times a day (BID) | NASAL | 0 refills | Status: DC | PRN

## 2021-12-11 NOTE — Telephone Encounter (Signed)
Several medications listed still with refills.  Does the patient need refills sent to a different pharmacy?

## 2021-12-11 NOTE — Addendum Note (Signed)
Addended by: Larey Seat on: 12/11/2021 05:30 PM   Modules accepted: Orders

## 2021-12-15 ENCOUNTER — Telehealth: Payer: Self-pay | Admitting: *Deleted

## 2021-12-15 ENCOUNTER — Other Ambulatory Visit: Payer: Self-pay | Admitting: *Deleted

## 2021-12-15 DIAGNOSIS — G43711 Chronic migraine without aura, intractable, with status migrainosus: Secondary | ICD-10-CM

## 2021-12-15 MED ORDER — UBRELVY 100 MG PO TABS: ORAL_TABLET | ORAL | 5 refills | Status: DC

## 2021-12-15 NOTE — Telephone Encounter (Signed)
Refills have been handled.

## 2021-12-15 NOTE — Telephone Encounter (Signed)
Submitted PA Ubrelvy on [facility]. Key: [id number]. Waiting on determination from [employer].

## 2021-12-15 NOTE — Telephone Encounter (Signed)
Submitted PA Stadol on [facility]. Key: [id number]. Waiting on determination from [employer] Medicare Part D.

## 2021-12-15 NOTE — Telephone Encounter (Signed)
"  Request Reference Number: [id number]. BUTORPHANOL SOL '10MG'$ /ML is approved through 01/14/2022. Your patient may now fill this prescription and it will be covered."

## 2021-12-16 MED ORDER — NURTEC 75 MG PO TBDP: ORAL_TABLET | ORAL | 5 refills | Status: DC

## 2021-12-16 NOTE — Addendum Note (Signed)
Addended by: Wyvonnia Lora on: 12/16/2021 08:58 AM   Modules accepted: Orders

## 2021-12-18 NOTE — Telephone Encounter (Signed)
PA Nurtec submitted on CMM. [id number] . Waiting on determination from [employer] Medicare Part D.   PA approved: "Request Reference Number: [id number]. NURTEC TAB '75MG'$  ODT is approved through 02/16/2023. Your patient may now fill this prescription and it will be covered."

## 2021-12-23 ENCOUNTER — Ambulatory Visit: Payer: Medicare Other | Admitting: Nurse Practitioner

## 2022-01-13 ENCOUNTER — Ambulatory Visit: Payer: Medicare Other | Admitting: Family

## 2022-02-10 ENCOUNTER — Other Ambulatory Visit: Payer: Self-pay | Admitting: Family Medicine

## 2022-02-10 DIAGNOSIS — N939 Abnormal uterine and vaginal bleeding, unspecified: Secondary | ICD-10-CM

## 2022-02-11 ENCOUNTER — Encounter (HOSPITAL_COMMUNITY): Payer: Self-pay

## 2022-02-11 ENCOUNTER — Ambulatory Visit (HOSPITAL_COMMUNITY)
Admission: EM | Admit: 2022-02-11 | Discharge: 2022-02-11 | Disposition: A | Discharge status: Home / Self Care | Payer: Medicare Other | Attending: Emergency Medicine | Admitting: Emergency Medicine

## 2022-02-11 DIAGNOSIS — R21 Rash and other nonspecific skin eruption: Secondary | ICD-10-CM

## 2022-02-11 MED ORDER — PREDNISONE 10 MG (21) PO TBPK: ORAL_TABLET | Freq: Every day | ORAL | 0 refills | Status: DC

## 2022-02-11 NOTE — Discharge Instructions (Signed)
Today you are being treated for rash, appears inflammatory in nature, no signs of infection at this time  Starting tomorrow take prednisone every morning with food as directed,to help reduce the inflammatory process that occurs with this rash which will help minimize your itching as well as begin to clear  You may continue use of topical calamine or Benadryl cream to help manage itching, you may also continue oral Benadryl  Please avoid long exposures to heat such as a hot steamy shower or being outside as this may cause further irritation to your rash  You may follow-up with his urgent care as needed if symptoms persist or worsen

## 2022-02-11 NOTE — ED Triage Notes (Signed)
Red rash on body on fore arms, ankle, back on knees, bend of arms, abdomin. Does not itch but it does burn. Started 2 weeks ago, has tried Cortizone cream, corn starch, benadryl, but nothing is taking the rash away.
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
## 2022-02-19 ENCOUNTER — Other Ambulatory Visit: Payer: Self-pay | Admitting: Pharmacist

## 2022-02-19 DIAGNOSIS — N939 Abnormal uterine and vaginal bleeding, unspecified: Secondary | ICD-10-CM

## 2022-02-19 MED ORDER — NORETHINDRONE ACETATE 5 MG PO TABS: 10.0000 mg | ORAL_TABLET | Freq: Every day | ORAL | 1 refills | Status: DC

## 2022-02-19 NOTE — Progress Notes (Signed)
Received refill request for norethindrone which patient has been using for bleeding until scheduled hysterectomy (03/24/2022). Rx refills sent.  Lolita Rieger, PharmD, BCPPS

## 2022-03-17 ENCOUNTER — Other Ambulatory Visit: Payer: Self-pay | Admitting: Neurology

## 2022-03-18 ENCOUNTER — Other Ambulatory Visit: Payer: Self-pay | Admitting: Neurology

## 2022-03-18 MED ORDER — BUTORPHANOL TARTRATE 10 MG/ML NA SOLN: 1.0000 | Freq: Two times a day (BID) | NASAL | 0 refills | Status: DC | PRN

## 2022-03-31 ENCOUNTER — Emergency Department (HOSPITAL_BASED_OUTPATIENT_CLINIC_OR_DEPARTMENT_OTHER)
Admission: EM | Admit: 2022-03-31 | Discharge: 2022-04-01 | Disposition: A | Discharge status: Other Acute Inpatient Hospital | Payer: Medicare Other | Attending: Emergency Medicine | Admitting: Emergency Medicine

## 2022-03-31 ENCOUNTER — Emergency Department (HOSPITAL_BASED_OUTPATIENT_CLINIC_OR_DEPARTMENT_OTHER): Payer: Medicare Other

## 2022-03-31 ENCOUNTER — Encounter (HOSPITAL_BASED_OUTPATIENT_CLINIC_OR_DEPARTMENT_OTHER): Payer: Self-pay

## 2022-03-31 ENCOUNTER — Other Ambulatory Visit: Payer: Self-pay

## 2022-03-31 DIAGNOSIS — N739 Female pelvic inflammatory disease, unspecified: Secondary | ICD-10-CM | POA: Diagnosis not present

## 2022-03-31 DIAGNOSIS — K5732 Diverticulitis of large intestine without perforation or abscess without bleeding: Secondary | ICD-10-CM | POA: Insufficient documentation

## 2022-03-31 DIAGNOSIS — R102 Pelvic and perineal pain: Secondary | ICD-10-CM | POA: Diagnosis present

## 2022-03-31 DIAGNOSIS — K5720 Diverticulitis of large intestine with perforation and abscess without bleeding: Secondary | ICD-10-CM

## 2022-03-31 LAB — URINALYSIS, ROUTINE W REFLEX MICROSCOPIC

## 2022-03-31 LAB — URINALYSIS, MICROSCOPIC (REFLEX)

## 2022-03-31 LAB — COMPREHENSIVE METABOLIC PANEL
ALT: 17 U/L (ref 0–44)
AST: 19 U/L (ref 15–41)
Albumin: 4.6 g/dL (ref 3.5–5.0)
Alkaline Phosphatase: 52 U/L (ref 38–126)
Anion gap: 11 (ref 5–15)
BUN: 20 mg/dL (ref 6–20)
CO2: 23 mmol/L (ref 22–32)
Calcium: 10 mg/dL (ref 8.9–10.3)
Chloride: 103 mmol/L (ref 98–111)
Creatinine, Ser: 0.86 mg/dL (ref 0.44–1.00)
GFR, Estimated: >60 mL/min (ref >60)
Glucose, Bld: 97 mg/dL (ref 70–99)
Potassium: 3.9 mmol/L (ref 3.5–5.1)
Sodium: 137 mmol/L (ref 135–145)
Total Bilirubin: 0.8 mg/dL (ref 0.3–1.2)
Total Protein: 7.9 g/dL (ref 6.5–8.1)

## 2022-03-31 LAB — LIPASE, BLOOD: Lipase: 11 U/L (ref 11–51)

## 2022-03-31 LAB — CBC
HCT: 44.2 % (ref 36.0–46.0)
Hemoglobin: 15 g/dL (ref 12.0–15.0)
MCH: 30.4 pg (ref 26.0–34.0)
MCHC: 33.9 g/dL (ref 30.0–36.0)
MCV: 89.7 fL (ref 80.0–100.0)
Platelets: 282 10*3/uL (ref 150–400)
RBC: 4.93 MIL/uL (ref 3.87–5.11)
RDW: 13.2 % (ref 11.5–15.5)
WBC: 11.1 10*3/uL — ABNORMAL HIGH (ref 4.0–10.5)
nRBC: 0 % (ref 0.0–0.2)

## 2022-03-31 LAB — PREGNANCY, URINE: Preg Test, Ur: NEGATIVE

## 2022-03-31 MED ORDER — FENTANYL CITRATE PF 50 MCG/ML IJ SOSY
50.0000 ug | PREFILLED_SYRINGE | Freq: Once | INTRAMUSCULAR | Status: AC
  Administered 2022-03-31: 50 ug via INTRAVENOUS
  Filled 2022-03-31: qty 1

## 2022-03-31 MED ORDER — SODIUM CHLORIDE 0.9 % IV BOLUS
1000.0000 mL | Freq: Once | INTRAVENOUS | Status: AC
  Administered 2022-04-01: 1000 mL via INTRAVENOUS

## 2022-03-31 MED ORDER — ONDANSETRON HCL 4 MG/2ML IJ SOLN
4.0000 mg | Freq: Once | INTRAMUSCULAR | Status: AC
  Administered 2022-03-31: 4 mg via INTRAVENOUS
  Filled 2022-03-31: qty 2

## 2022-03-31 MED ORDER — IOHEXOL 300 MG/ML  SOLN
100.0000 mL | Freq: Once | INTRAMUSCULAR | Status: AC | PRN
  Administered 2022-03-31: 80 mL via INTRAVENOUS

## 2022-03-31 NOTE — ED Triage Notes (Signed)
Patient here POV from Home.  Endorses Hysterectomy on 02/06. Last Week she noted Gas Pain and Constipation. Took a "Light" Laxative which helped facilitate a small BM. During this time and since she has had a Pain to Lower ABD Region.    Some Dysuria noted as well with Mild Relief from OTC Medication. No known Fevers. Nausea. No Emesis.   NAD Noted during Triage. A&Ox4. GCS 15. Ambulatory.

## 2022-03-31 NOTE — ED Provider Notes (Signed)
[facility] Provider Note   CSN: [id number] Arrival date & time: 03/31/22  2140     History {Add pertinent medical, surgical, social history, OB history to HPI:1} Chief Complaint  Patient presents with   Pelvic Pain    Kristin Braun is a 47 y.o. female.  Patient with left-sided pelvic pain for the past 6 days.  She underwent a total hysterectomy on February 6 due to vaginal bleeding.  She also had a bladder sling performed.  States she had postop urinary retention and required a Foley catheter for 1 additional day.  The catheter was removed 5 days ago.  Since then she has had worsening left-sided lower abdominal pain that has been constant and not improved with oxycodone.  She took a laxative because she thought she was constipated but has not noticed any relief.  States she has been moving her bowels every day.  Nausea but no vomiting.  No fever.  Having pain with urination as well as red-tinged urine.  No vaginal bleeding or discharge.  Has not called her surgeon since the surgery. Reports both of her ovaries were removed.  HYSTERECTOMY TOTAL VAGINAL W/ BILAT SALPINGO-OOPHORECTOMY (N/A) TRANSVAGINAL REPAIR- USLS (N/A) MID-URETHRAL SLING WITH CYSTO (N/A)  Was performed at [facility] on February 6.   The history is provided by the patient.  Pelvic Pain Associated symptoms include abdominal pain. Pertinent negatives include no chest pain, no headaches and no shortness of breath.       Home Medications Prior to Admission medications   Medication Sig Start Date End Date Taking? Authorizing Provider  butorphanol (STADOL) 10 MG/ML nasal spray Place 1 spray into the nose every 12 (twelve) hours as needed for headache or migraine. 03/18/22   Dohmeier, Asencion Partridge, MD  clonazePAM (KLONOPIN) 1 MG tablet TAKE ONE TABLET BY MOUTH TWICE A DAY AS NEEDED FOR ANXIETY 07/12/20   Arfeen, Arlyce Harman, MD  cyclobenzaprine (FLEXERIL) 10 MG tablet Take 1  tablet (10 mg total) by mouth at bedtime. 12/11/21   Dohmeier, Asencion Partridge, MD  FLUoxetine (PROZAC) 40 MG capsule Take 1 capsule (40 mg total) by mouth daily. 04/03/20 11/13/21  Pucilowski, Olgierd A, MD  Galcanezumab-gnlm (EMGALITY) 120 MG/ML SOAJ Use injection subcutaneously once every 30 days. 10/23/20   Dohmeier, Asencion Partridge, MD  ibuprofen (ADVIL) 200 MG tablet Take 300-400 mg by mouth every 6 (six) hours as needed for moderate pain.     [provider]  norethindrone (AYGESTIN) 5 MG tablet Take 2 tablets (10 mg total) by mouth daily. 02/19/22   Griffin Basil, MD  NURTEC 75 MG TBDP Place one tablet on or under your tongue as soon as you notice symptoms of a migraine episode. Let the tablet dissolve, then swallow it. You can repeat this dose in 24 hours if needed. 12/16/21   Dohmeier, Asencion Partridge, MD  oxcarbazepine (TRILEPTAL) 600 MG tablet Take 1 tablet (600 mg total) by mouth 2 (two) times daily. 04/03/20 11/13/21  Pucilowski, Marchia Bond, MD  predniSONE (STERAPRED UNI-PAK 21 TAB) 10 MG (21) TBPK tablet Take by mouth daily. Take 6 tabs by mouth daily  for 2 days, then 5 tabs for 2 days, then 4 tabs for 2 days, then 3 tabs for 2 days, 2 tabs for 2 days, then 1 tab by mouth daily for 2 days 02/11/22   Hans Eden, NP  promethazine (PHENERGAN) 12.5 MG tablet Take 1 tablet (12.5 mg total) by mouth every 8 (eight) hours as needed  for nausea or vomiting. 12/09/21   Dohmeier, Asencion Partridge, MD  topiramate (TOPAMAX) 200 MG tablet Take 1 tablet (200 mg total) by mouth 2 (two) times daily. 12/09/21   Dohmeier, Asencion Partridge, MD  traZODone (DESYREL) 150 MG tablet Take 1 tablet (150 mg total) by mouth at bedtime as needed for sleep. 04/03/20 11/13/21  Pucilowski, Marchia Bond, MD  ziprasidone (GEODON) 40 MG capsule Take 1 capsule (40 mg total) by mouth daily with supper. 04/03/20 11/13/21  Pucilowski, Marchia Bond, MD      Allergies    Penicillins and Morphine and related    Review of Systems   Review of Systems  Constitutional:   Positive for activity change and appetite change. Negative for fever.  HENT:  Negative for congestion and sinus pain.   Respiratory:  Negative for cough, chest tightness and shortness of breath.   Cardiovascular:  Negative for chest pain.  Gastrointestinal:  Positive for abdominal pain, constipation and nausea. Negative for vomiting.  Genitourinary:  Positive for pelvic pain. Negative for dysuria and vaginal bleeding.  Musculoskeletal:  Negative for arthralgias and myalgias.  Skin:  Negative for rash.  Neurological:  Negative for dizziness, weakness and headaches.   all other systems are negative except as noted in the HPI and PMH.    Physical Exam Updated Vital Signs BP 109/85 (BP Location: Left Arm)   Pulse (!) 111   Temp 97.7 F (36.5 C)   Resp 18   Ht 5' 3"$  (1.6 m)   Wt 83 kg   SpO2 98%   BMI 32.41 kg/m  Physical Exam Vitals and nursing note reviewed.  Constitutional:      General: She is not in acute distress.    Appearance: She is well-developed.  HENT:     Head: Normocephalic and atraumatic.     Mouth/Throat:     Pharynx: No oropharyngeal exudate.  Eyes:     Conjunctiva/sclera: Conjunctivae normal.     Pupils: Pupils are equal, round, and reactive to light.  Neck:     Comments: No meningismus. Cardiovascular:     Rate and Rhythm: Normal rate and regular rhythm.     Heart sounds: Normal heart sounds. No murmur heard. Pulmonary:     Effort: Pulmonary effort is normal. No respiratory distress.     Breath sounds: Normal breath sounds.  Abdominal:     Palpations: Abdomen is soft.     Tenderness: There is abdominal tenderness. There is guarding. There is no rebound.     Comments: Left lower quadrant tenderness with voluntary guarding.  No rebound.  Mild diffuse tenderness.  Musculoskeletal:        General: No tenderness. Normal range of motion.     Cervical back: Normal range of motion and neck supple.  Skin:    General: Skin is warm.  Neurological:     Mental  Status: She is alert and oriented to person, place, and time.     Cranial Nerves: No cranial nerve deficit.     Motor: No abnormal muscle tone.     Coordination: Coordination normal.     Comments:  5/5 strength throughout. CN 2-12 intact.Equal grip strength.   Psychiatric:        Behavior: Behavior normal.     ED Results / Procedures / Treatments   Labs (all labs ordered are listed, but only abnormal results are displayed) Labs Reviewed  CBC - Abnormal; Notable for the following components:      Result Value   WBC 11.1 (*)  All other components within normal limits  URINALYSIS, ROUTINE W REFLEX MICROSCOPIC - Abnormal; Notable for the following components:   Color, Urine ORANGE (*)    Glucose, UA   (*)    Value: TEST NOT REPORTED DUE TO COLOR INTERFERENCE OF URINE PIGMENT   Hgb urine dipstick   (*)    Value: TEST NOT REPORTED DUE TO COLOR INTERFERENCE OF URINE PIGMENT   Bilirubin Urine   (*)    Value: TEST NOT REPORTED DUE TO COLOR INTERFERENCE OF URINE PIGMENT   Ketones, ur   (*)    Value: TEST NOT REPORTED DUE TO COLOR INTERFERENCE OF URINE PIGMENT   Protein, ur   (*)    Value: TEST NOT REPORTED DUE TO COLOR INTERFERENCE OF URINE PIGMENT   Nitrite   (*)    Value: TEST NOT REPORTED DUE TO COLOR INTERFERENCE OF URINE PIGMENT   Leukocytes,Ua   (*)    Value: TEST NOT REPORTED DUE TO COLOR INTERFERENCE OF URINE PIGMENT   All other components within normal limits  URINALYSIS, MICROSCOPIC (REFLEX) - Abnormal; Notable for the following components:   Bacteria, UA RARE (*)    All other components within normal limits  LIPASE, BLOOD  COMPREHENSIVE METABOLIC PANEL  PREGNANCY, URINE    EKG None  Radiology No results found.  Procedures Procedures  {Document cardiac monitor, telemetry assessment procedure when appropriate:1}  Medications Ordered in ED Medications  fentaNYL (SUBLIMAZE) injection 50 mcg (has no administration in time range)  ondansetron (ZOFRAN) injection 4  mg (has no administration in time range)  sodium chloride 0.9 % bolus 1,000 mL (has no administration in time range)    ED Course/ Medical Decision Making/ A&P   {   Click here for ABCD2, HEART and other calculatorsREFRESH Note before signing :1}                          Medical Decision Making Amount and/or Complexity of Data Reviewed Labs: ordered. Decision-making details documented in ED Course. Radiology: ordered and independent interpretation performed. Decision-making details documented in ED Course. ECG/medicine tests: ordered and independent interpretation performed. Decision-making details documented in ED Course.  Risk Prescription drug management.   Left-sided pelvic pain status post hysterectomy 1 week ago.  Abdomen soft without peritoneal signs.  Mild tachycardia. {Document critical care time when appropriate:1} {Document review of labs and clinical decision tools ie heart score, Chads2Vasc2 etc:1}  {Document your independent review of radiology images, and any outside records:1} {Document your discussion with family members, caretakers, and with consultants:1} {Document social determinants of health affecting pt's care:1} {Document your decision making why or why not admission, treatments were needed:1} Final Clinical Impression(s) / ED Diagnoses Final diagnoses:  None    Rx / DC Orders ED Discharge Orders     None

## 2022-04-01 LAB — LACTIC ACID, PLASMA: Lactic Acid, Venous: 0.5 mmol/L (ref 0.5–1.9)

## 2022-04-01 MED ORDER — METRONIDAZOLE 500 MG/100ML IV SOLN
500.0000 mg | Freq: Two times a day (BID) | INTRAVENOUS | Status: DC
  Administered 2022-04-01: 500 mg via INTRAVENOUS
  Filled 2022-04-01: qty 100

## 2022-04-01 MED ORDER — CIPROFLOXACIN IN D5W 400 MG/200ML IV SOLN
400.0000 mg | Freq: Once | INTRAVENOUS | Status: AC
  Administered 2022-04-01: 400 mg via INTRAVENOUS
  Filled 2022-04-01: qty 200

## 2022-04-06 LAB — CULTURE, BLOOD (ROUTINE X 2): Culture: NO GROWTH

## 2022-04-07 ENCOUNTER — Other Ambulatory Visit: Payer: Self-pay | Admitting: Obstetrics and Gynecology

## 2022-04-07 DIAGNOSIS — N939 Abnormal uterine and vaginal bleeding, unspecified: Secondary | ICD-10-CM

## 2022-04-20 ENCOUNTER — Emergency Department (HOSPITAL_BASED_OUTPATIENT_CLINIC_OR_DEPARTMENT_OTHER)
Admission: EM | Admit: 2022-04-20 | Discharge: 2022-04-20 | Disposition: A | Discharge status: Home / Self Care | Payer: Medicare Other | Attending: Emergency Medicine | Admitting: Emergency Medicine

## 2022-04-20 ENCOUNTER — Encounter (HOSPITAL_BASED_OUTPATIENT_CLINIC_OR_DEPARTMENT_OTHER): Payer: Self-pay | Admitting: Emergency Medicine

## 2022-04-20 ENCOUNTER — Other Ambulatory Visit: Payer: Self-pay

## 2022-04-20 ENCOUNTER — Emergency Department (HOSPITAL_BASED_OUTPATIENT_CLINIC_OR_DEPARTMENT_OTHER): Payer: Medicare Other

## 2022-04-20 DIAGNOSIS — R000 Tachycardia, unspecified: Secondary | ICD-10-CM | POA: Insufficient documentation

## 2022-04-20 DIAGNOSIS — R110 Nausea: Secondary | ICD-10-CM | POA: Insufficient documentation

## 2022-04-20 DIAGNOSIS — R1032 Left lower quadrant pain: Secondary | ICD-10-CM | POA: Insufficient documentation

## 2022-04-20 DIAGNOSIS — D72829 Elevated white blood cell count, unspecified: Secondary | ICD-10-CM | POA: Diagnosis not present

## 2022-04-20 LAB — URINALYSIS, ROUTINE W REFLEX MICROSCOPIC
Bacteria, UA: NONE SEEN
Glucose, UA: NEGATIVE mg/dL
Hgb urine dipstick: NEGATIVE
Ketones, ur: 15 mg/dL — AB
Nitrite: NEGATIVE
Protein, ur: 100 mg/dL — AB
Specific Gravity, Urine: 1.032 — ABNORMAL HIGH (ref 1.005–1.030)
pH: 6.5 (ref 5.0–8.0)

## 2022-04-20 LAB — CBC
HCT: 46.3 % — ABNORMAL HIGH (ref 36.0–46.0)
Hemoglobin: 15.5 g/dL — ABNORMAL HIGH (ref 12.0–15.0)
MCH: 30.5 pg (ref 26.0–34.0)
MCHC: 33.5 g/dL (ref 30.0–36.0)
MCV: 91 fL (ref 80.0–100.0)
Platelets: 331 10*3/uL (ref 150–400)
RBC: 5.09 MIL/uL (ref 3.87–5.11)
RDW: 14.3 % (ref 11.5–15.5)
WBC: 12.5 10*3/uL — ABNORMAL HIGH (ref 4.0–10.5)
nRBC: 0 % (ref 0.0–0.2)

## 2022-04-20 LAB — COMPREHENSIVE METABOLIC PANEL
ALT: 30 U/L (ref 0–44)
AST: 26 U/L (ref 15–41)
Albumin: 4.9 g/dL (ref 3.5–5.0)
Alkaline Phosphatase: 52 U/L (ref 38–126)
Anion gap: 15 (ref 5–15)
BUN: 12 mg/dL (ref 6–20)
CO2: 19 mmol/L — ABNORMAL LOW (ref 22–32)
Calcium: 10.4 mg/dL — ABNORMAL HIGH (ref 8.9–10.3)
Chloride: 106 mmol/L (ref 98–111)
Creatinine, Ser: 0.96 mg/dL (ref 0.44–1.00)
GFR, Estimated: >60 mL/min (ref >60)
Glucose, Bld: 136 mg/dL — ABNORMAL HIGH (ref 70–99)
Potassium: 4 mmol/L (ref 3.5–5.1)
Sodium: 140 mmol/L (ref 135–145)
Total Bilirubin: 1.1 mg/dL (ref 0.3–1.2)
Total Protein: 8.2 g/dL — ABNORMAL HIGH (ref 6.5–8.1)

## 2022-04-20 LAB — LIPASE, BLOOD: Lipase: 38 U/L (ref 11–51)

## 2022-04-20 MED ORDER — SODIUM CHLORIDE 0.9 % IV BOLUS
1000.0000 mL | Freq: Once | INTRAVENOUS | Status: AC
  Administered 2022-04-20: 1000 mL via INTRAVENOUS

## 2022-04-20 MED ORDER — SENNOSIDES-DOCUSATE SODIUM 8.6-50 MG PO TABS: 1.0000 | ORAL_TABLET | Freq: Every day | ORAL | 0 refills | Status: AC | PRN

## 2022-04-20 MED ORDER — FENTANYL CITRATE PF 50 MCG/ML IJ SOSY
100.0000 ug | PREFILLED_SYRINGE | Freq: Once | INTRAMUSCULAR | Status: AC
  Administered 2022-04-20: 50 ug via INTRAVENOUS
  Filled 2022-04-20: qty 2

## 2022-04-20 MED ORDER — TRAMADOL HCL 50 MG PO TABS: 50.0000 mg | ORAL_TABLET | Freq: Three times a day (TID) | ORAL | 0 refills | Status: AC | PRN

## 2022-04-20 MED ORDER — IOHEXOL 300 MG/ML  SOLN: 80.0000 mL | Freq: Once | INTRAMUSCULAR | Status: DC | PRN

## 2022-04-20 MED ORDER — IOHEXOL 350 MG/ML SOLN
80.0000 mL | Freq: Once | INTRAVENOUS | Status: AC | PRN
  Administered 2022-04-20: 80 mL via INTRAVENOUS

## 2022-04-20 MED ORDER — FENTANYL CITRATE PF 50 MCG/ML IJ SOSY
50.0000 ug | PREFILLED_SYRINGE | Freq: Once | INTRAMUSCULAR | Status: AC
  Administered 2022-04-20: 50 ug via INTRAVENOUS
  Filled 2022-04-20: qty 1

## 2022-04-20 NOTE — ED Triage Notes (Signed)
Pt arrives to ED with c/o lower abdominal pain that started last night. She notes total hysterectomy on 2/6.

## 2022-04-20 NOTE — ED Provider Notes (Signed)
[place] Provider Note   CSN: [id number] Arrival date & time: 04/20/22  1432     History  Chief Complaint  Patient presents with   Abdominal Pain    Kristin Braun is a 47 y.o. female presenting to the ED with abdominal pain.  Patient reports she has had worsening pain ever since she was discharged from the hospital [facility] 2 weeks ago.  She says the pain is in the left lower side of her abdomen.  She has been having daily loose bowel movements and also dysuria and difficulty with urination.  She reports she has completed 3 courses of antibiotics as prescribed by her surgeons at [facility].  She says she has not really been able to empty her bladder since 5 PM yesterday, only a small trickle.  Her pain is 10 out of 10.  She is nauseated as well.  I reviewed her external records including her hospital summary course, she was discharged on February 16 from [facility], and this is copied below.  "The patient presented to the ED as a transfer from [facility] on 2/14 after with worsening abdominal and back pain and a CT with several small fluid connections as well as sigmoid thickening and possible sigmoid fistulization, all concerning for a post operative complication versus sigmoid diverticulitis. In the ED at [facility], she was afebrile, non-tachycardic. On exam she had lower back and lower abdominal tenderness L>R, without rebound or guarding. She did not have WBC or any significant electrolyte derangements. Her UA was consistent with UTI. EGS was consulted given the concern for diverticulitis, however expressed that this was lower on their differential than a post operative complication. Urogynecology was consulted and admitted to the patient to our service for pain control and a second CT read by radiology.  While admitted, the patient was continued on IV Zosyn to cover for potential post operative infection. IR was consulted and did not feel the  fluid collections were amenable to drainage. Radiology was consulted and performed a repeat read of her scan from [facility] (see read under "imaging" tab). There was low concern for diverticulitis or clear fistulization.  The patient's pain remained stable, and her abdominal exam remained reassuring. She was transitioned from IV Zosyn to Bactrim/Flagyl on HD 2, covering both for possible intra-abdominal infection as well as suspected UTI. She tolerated these antibiotics well, and remained afebrile on HD3. Her pain was moderately controlled, she was tolerating PO, ambulating independently, voiding and having bowel movements. She was felt stable for discharge with a 2 week course of flagyl/bactrim and close outpatient follow up."   HPI     Home Medications Prior to Admission medications   Medication Sig Start Date End Date Taking? Authorizing Provider  senna-docusate (SENOKOT-S) 8.6-50 MG tablet Take 1 tablet by mouth daily as needed for up to 15 doses for mild constipation. 04/20/22  Yes Khairi Garman, Carola Rhine, MD  traMADol (ULTRAM) 50 MG tablet Take 1 tablet (50 mg total) by mouth every 8 (eight) hours as needed for up to 12 doses for severe pain. 04/20/22  Yes Renise Gillies, Carola Rhine, MD  butorphanol (STADOL) 10 MG/ML nasal spray Place 1 spray into the nose every 12 (twelve) hours as needed for headache or migraine. 03/18/22   Dohmeier, Asencion Partridge, MD  clonazePAM (KLONOPIN) 1 MG tablet TAKE ONE TABLET BY MOUTH TWICE A DAY AS NEEDED FOR ANXIETY 07/12/20   Arfeen, Arlyce Harman, MD  cyclobenzaprine (FLEXERIL) 10 MG tablet Take  1 tablet (10 mg total) by mouth at bedtime. 12/11/21   Dohmeier, Asencion Partridge, MD  FLUoxetine (PROZAC) 40 MG capsule Take 1 capsule (40 mg total) by mouth daily. 04/03/20 11/13/21  Pucilowski, Olgierd A, MD  Galcanezumab-gnlm (EMGALITY) 120 MG/ML SOAJ Use injection subcutaneously once every 30 days. 10/23/20   Dohmeier, Asencion Partridge, MD  ibuprofen (ADVIL) 200 MG tablet Take 300-400 mg by mouth every 6 (six) hours as  needed for moderate pain.     [provider]  norethindrone (AYGESTIN) 5 MG tablet TAKE 2 TABLETS BY MOUTH DAILY 04/09/22   Griffin Basil, MD  NURTEC 75 MG TBDP Place one tablet on or under your tongue as soon as you notice symptoms of a migraine episode. Let the tablet dissolve, then swallow it. You can repeat this dose in 24 hours if needed. 12/16/21   Dohmeier, Asencion Partridge, MD  oxcarbazepine (TRILEPTAL) 600 MG tablet Take 1 tablet (600 mg total) by mouth 2 (two) times daily. 04/03/20 11/13/21  Pucilowski, Marchia Bond, MD  predniSONE (STERAPRED UNI-PAK 21 TAB) 10 MG (21) TBPK tablet Take by mouth daily. Take 6 tabs by mouth daily  for 2 days, then 5 tabs for 2 days, then 4 tabs for 2 days, then 3 tabs for 2 days, 2 tabs for 2 days, then 1 tab by mouth daily for 2 days 02/11/22   Hans Eden, NP  promethazine (PHENERGAN) 12.5 MG tablet Take 1 tablet (12.5 mg total) by mouth every 8 (eight) hours as needed for nausea or vomiting. 12/09/21   Dohmeier, Asencion Partridge, MD  topiramate (TOPAMAX) 200 MG tablet Take 1 tablet (200 mg total) by mouth 2 (two) times daily. 12/09/21   Dohmeier, Asencion Partridge, MD  traZODone (DESYREL) 150 MG tablet Take 1 tablet (150 mg total) by mouth at bedtime as needed for sleep. 04/03/20 11/13/21  Pucilowski, Marchia Bond, MD  ziprasidone (GEODON) 40 MG capsule Take 1 capsule (40 mg total) by mouth daily with supper. 04/03/20 11/13/21  Pucilowski, Marchia Bond, MD      Allergies    Shellfish allergy, Penicillins, Polyethylene glycol 3350, and Morphine and related    Review of Systems   Review of Systems  Physical Exam Updated Vital Signs BP (!) 157/94   Pulse 96   Temp 98.6 F (37 C)   Resp (!) 23   Ht '5\' 3"'$  (1.6 m)   Wt 81.6 kg   SpO2 96%   BMI 31.89 kg/m  Physical Exam Constitutional:      General: She is not in acute distress. HENT:     Head: Normocephalic and atraumatic.  Eyes:     Conjunctiva/sclera: Conjunctivae normal.     Pupils: Pupils are equal, round, and  reactive to light.  Cardiovascular:     Rate and Rhythm: Regular rhythm. Tachycardia present.  Pulmonary:     Effort: Pulmonary effort is normal. No respiratory distress.  Abdominal:     General: There is no distension.     Tenderness: There is abdominal tenderness in the left lower quadrant.  Skin:    General: Skin is warm and dry.  Neurological:     General: No focal deficit present.     Mental Status: She is alert. Mental status is at baseline.  Psychiatric:        Mood and Affect: Mood normal.        Behavior: Behavior normal.     ED Results / Procedures / Treatments   Labs (all labs ordered are listed, but only abnormal  results are displayed) Labs Reviewed  COMPREHENSIVE METABOLIC PANEL - Abnormal; Notable for the following components:      Result Value   CO2 19 (*)    Glucose, Bld 136 (*)    Calcium 10.4 (*)    Total Protein 8.2 (*)    All other components within normal limits  CBC - Abnormal; Notable for the following components:   WBC 12.5 (*)    Hemoglobin 15.5 (*)    HCT 46.3 (*)    All other components within normal limits  URINALYSIS, ROUTINE W REFLEX MICROSCOPIC - Abnormal; Notable for the following components:   Specific Gravity, Urine 1.032 (*)    Bilirubin Urine SMALL (*)    Ketones, ur 15 (*)    Protein, ur 100 (*)    Leukocytes,Ua SMALL (*)    All other components within normal limits  LIPASE, BLOOD    EKG EKG Interpretation  Date/Time:  Monday April 20 2022 15:33:32 EST Ventricular Rate:  103 PR Interval:  128 QRS Duration: 77 QT Interval:  336 QTC Calculation: 440 R Axis:   54 Text Interpretation: Sinus tachycardia Confirmed by Regan Lemming ([id number]) on 04/20/2022 3:57:27 PM  Radiology CT ABDOMEN PELVIS W CONTRAST  Result Date: 04/20/2022 CLINICAL DATA:  Left lower quadrant abdominal pain EXAM: CT ABDOMEN AND PELVIS WITH CONTRAST TECHNIQUE: Multidetector CT imaging of the abdomen and pelvis was performed using the standard protocol following  bolus administration of intravenous contrast. RADIATION DOSE REDUCTION: This exam was performed according to the departmental dose-optimization program which includes automated exposure control, adjustment of the mA and/or kV according to patient size and/or use of iterative reconstruction technique. CONTRAST:  76m OMNIPAQUE IOHEXOL 350 MG/ML SOLN COMPARISON:  04/01/2022 FINDINGS: Lower chest: Lung bases are clear. Hepatobiliary: Scattered hepatic cysts measuring up to 2.2 cm. Gallbladder is unremarkable. No intrahepatic or extrahepatic duct dilatation. Pancreas: Within normal limits. Spleen: Within normal limits. Adrenals/Urinary Tract: Adrenal glands are within normal limits. Left upper pole renal sinus cyst. No follow-up is recommended. Right kidney is within normal limits. No hydronephrosis. Bladder is decompressed by an indwelling Foley catheter. Stomach/Bowel: Stomach is within normal limits. No evidence of bowel obstruction. Normal appendix (series 2/image 37). No colonic wall thickening or inflammatory changes. Vascular/Lymphatic: No evidence of abdominal aortic aneurysm. No suspicious abdominopelvic lymphadenopathy. Reproductive: Status post hysterectomy. Bilateral ovaries are within normal limits, noting tubal ligation clips. Other: No abdominopelvic ascites. Small fat containing right paramidline supraumbilical ventral hernia (series 2/image 42). Musculoskeletal: Visualized osseous structures are within normal limits. IMPRESSION: No CT findings to account for the patient's left lower quadrant abdominal pain. Additional ancillary findings as above. Electronically Signed   By: SJulian HyM.D.   On: 04/20/2022 17:06    Procedures Procedures    Medications Ordered in ED Medications  fentaNYL (SUBLIMAZE) injection 100 mcg (50 mcg Intravenous Given 04/20/22 1606)  sodium chloride 0.9 % bolus 1,000 mL ( Intravenous Stopped 04/20/22 1733)  fentaNYL (SUBLIMAZE) injection 50 mcg (50 mcg Intravenous  Given 04/20/22 1610)  iohexol (OMNIPAQUE) 350 MG/ML injection 80 mL (80 mLs Intravenous Contrast Given 04/20/22 1651)    ED Course/ Medical Decision Making/ A&P Clinical Course as of 04/20/22 2328  Mon Apr 20, 2022  1612 I was notified by patient's nurse that 50 mcg of the initial 100 mcg fentanyl dose was spilled by accident in the room and not able to be given.  I ordered an additional 50 mcg [MT]  1914 No emergent findings on CT the scan.  On my reassessment the patient appears much more comfortable, reports she is still having some pain.  We will remove the Foley catheter as she was not retaining urine I do not believe there is a need for having this catheter in place.  She may be having pelvic muscle spasms after her procedure.  I do not see any emergent condition ongoing at this time.  She will need to follow-up with her surgeon [MT]    Clinical Course User Index [MT] Tanuj Mullens, Carola Rhine, MD                             Medical Decision Making Amount and/or Complexity of Data Reviewed Labs: ordered. Radiology: ordered.  Risk OTC drugs. Prescription drug management.   This patient presents to the ED with concern for left lower sided abdominal pain. This involves an extensive number of treatment options, and is a complaint that carries with it a high risk of complications and morbidity.  The differential diagnosis includes posterior goal complication including abscess versus diverticulitis versus ureteral tract infection versus UTI versus pelvic muscle spasm versus other  Co-morbidities that complicate the patient evaluation: Recent surgery within the past month at high risk of postsurgical complication  External records from outside source obtained and reviewed including [facility] discharge summary as noted above  I ordered and personally interpreted labs.  The pertinent results include: Labs are largely unremarkable.  Very minor leukocytosis I suspect is reactive, she has  high specific gravity in the urine and does appear to have concentrated urine.  A Foley catheter was placed as there was concern initially the patient may have urinary retention but only small amount of urine return.  Urine appears concentrated.  I suspect more likely she is dehydrated.  She has been urinating at home.  Encouraged her to drink a lot more water.  I ordered imaging studies including CT abdomen pelvis I independently visualized and interpreted imaging which showed status post hysterectomy with no other emergent findings noted.  No recurring abscess or intra-abdominal fluid collection at this time. I agree with the radiologist interpretation  The patient was maintained on a cardiac monitor.  I personally viewed and interpreted the cardiac monitored which showed an underlying rhythm of: Sinus rhythm  Per my interpretation the patient's ECG shows no acute ischemic findings  I ordered medication including IV medication for pain and nausea  I have reviewed the patients home medicines and have made adjustments as needed  After the interventions noted above, I reevaluated the patient and found that they have: improved  Overall, I do not see any acute surgical or infectious emergencies.  Nor do I see signs of continued infection require further antibiotics.  She has completed the extensive antibiotics prescribed at the time of her discharge.  She will need to follow-up with her OB/GYN provider for what I suspect is now postoperative pelvic pain.  We were able to discontinue the Foley catheter in the ED as she is not truly needing this at home.  We discussed tramadol medication for pain at home.  She seems to tolerate this better than other opioids per her report.  Dispostion:  After consideration of the diagnostic results and the patients response to treatment, I feel that the patent would benefit from outpatient OB follow-up         Final Clinical Impression(s) / ED  Diagnoses Final diagnoses:  Left lower quadrant abdominal pain  Rx / DC Orders ED Discharge Orders          Ordered    traMADol (ULTRAM) 50 MG tablet  Every 8 hours PRN        04/20/22 1916    senna-docusate (SENOKOT-S) 8.6-50 MG tablet  Daily PRN        04/20/22 1916              Wyvonnia Dusky, MD 04/20/22 2328

## 2022-04-20 NOTE — Discharge Instructions (Signed)
Please contact your OB/GYN surgeon to discuss your visit to the ER for ongoing pain.

## 2022-05-07 ENCOUNTER — Other Ambulatory Visit: Payer: Self-pay | Admitting: Neurology

## 2022-06-09 ENCOUNTER — Ambulatory Visit (INDEPENDENT_AMBULATORY_CARE_PROVIDER_SITE_OTHER): Payer: Medicare Other

## 2022-06-09 ENCOUNTER — Ambulatory Visit (HOSPITAL_COMMUNITY)
Admission: EM | Admit: 2022-06-09 | Discharge: 2022-06-09 | Disposition: A | Discharge status: Home / Self Care | Payer: Medicare Other | Attending: Internal Medicine | Admitting: Internal Medicine

## 2022-06-09 ENCOUNTER — Encounter (HOSPITAL_COMMUNITY): Payer: Self-pay | Admitting: Emergency Medicine

## 2022-06-09 DIAGNOSIS — M25531 Pain in right wrist: Secondary | ICD-10-CM
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
## 2022-06-09 NOTE — ED Triage Notes (Signed)
Pt reports fell [place] [employer] 3/29 and hit right shoulder/arm on on brick wall. Reports for past 2 weeks right wrist and forearm is very painful, and feels"sleeping tingling to it". Reports pain will catch and shoot up right arm. Take Ibuprofen

## 2022-06-09 NOTE — Discharge Instructions (Addendum)
Your x-rays of your wrist were negative for fracture or dislocation. You likely sprained your wrist.   Wear the wrist brace we provided in the clinic for the next couple of weeks to provide compression, stability, and comfort.  This will also help with compression of the median nerve contributing to your tingling sensation in your right hand. Wear the wrist brace at nighttime ongoing if you find that this helps with tingling to your right hand.  Please rest, ice, and elevate your wrist to help it heal and decrease inflammation.   Take  ibuprofen and/or 1,000mg  tylenol every 6 hours as needed for pain and inflammation. Take with food to avoid stomach upset.  Call the orthopedic provider listed on your discharge paperwork to schedule a follow-up appointment if your symptoms do not improve in the next 1-2 weeks with supportive care.  Return to urgent care if you experience worsening pain, numbness, tingling, change of color in your skin near the injury, or any other concerning symptoms.  I hope you feel better!

## 2022-06-19 ENCOUNTER — Other Ambulatory Visit: Payer: Self-pay | Admitting: Neurology

## 2022-06-19 DIAGNOSIS — G40209 Localization-related (focal) (partial) symptomatic epilepsy and epileptic syndromes with complex partial seizures, not intractable, without status epilepticus: Secondary | ICD-10-CM

## 2022-08-12 ENCOUNTER — Telehealth: Payer: Medicare Other | Admitting: Physician Assistant

## 2022-08-12 NOTE — Progress Notes (Signed)
The patient no-showed for appointment despite this provider sending direct link x 2 with no response and waiting for at least 10 minutes from appointment time for patient to join. They will be marked as a NS for this appointment/time.   [place] Natarajan M Tong Pieczynski, PA-C    

## 2022-08-13 ENCOUNTER — Ambulatory Visit: Payer: Medicare Other

## 2022-08-18 ENCOUNTER — Telehealth: Payer: Self-pay | Admitting: Neurology

## 2022-08-18 ENCOUNTER — Ambulatory Visit: Payer: Medicare Other | Admitting: Family

## 2022-08-18 NOTE — Telephone Encounter (Signed)
Pt has upcoming apt and she is inquiring about completed the HST prior to sept pt.   Please advise- pt requested an appt and is sched for 10/29/22. Wants to know about redoing her sleep test beforehand.

## 2022-09-08 ENCOUNTER — Ambulatory Visit: Payer: Medicare Other | Admitting: Family

## 2022-10-02 ENCOUNTER — Encounter: Payer: Self-pay | Admitting: Family Medicine

## 2022-10-02 DIAGNOSIS — Z1231 Encounter for screening mammogram for malignant neoplasm of breast: Secondary | ICD-10-CM

## 2022-10-14 ENCOUNTER — Encounter: Payer: Self-pay | Admitting: Neurology

## 2022-10-14 ENCOUNTER — Ambulatory Visit: Payer: Medicare Other | Admitting: Neurology

## 2022-10-29 ENCOUNTER — Ambulatory Visit: Payer: Medicare Other | Admitting: Neurology

## 2022-10-29 ENCOUNTER — Ambulatory Visit: Payer: Medicare Other | Admitting: Family Medicine

## 2022-10-29 ENCOUNTER — Encounter: Payer: Self-pay | Admitting: Family Medicine

## 2022-10-29 NOTE — Progress Notes (Signed)
Patient did not keep appointment today. She may call to reschedule.  

## 2022-12-25 ENCOUNTER — Other Ambulatory Visit: Payer: Self-pay | Admitting: Family Medicine

## 2022-12-25 DIAGNOSIS — Z1231 Encounter for screening mammogram for malignant neoplasm of breast: Secondary | ICD-10-CM

## 2023-01-13 ENCOUNTER — Ambulatory Visit
Admission: RE | Admit: 2023-01-13 | Discharge: 2023-01-13 | Disposition: A | Discharge status: Home / Self Care | Payer: Medicare Other | Source: Ambulatory Visit

## 2023-01-13 DIAGNOSIS — Z1231 Encounter for screening mammogram for malignant neoplasm of breast: Secondary | ICD-10-CM

## 2023-03-05 ENCOUNTER — Inpatient Hospital Stay (HOSPITAL_BASED_OUTPATIENT_CLINIC_OR_DEPARTMENT_OTHER): Admission: RE | Admit: 2023-03-05 | Payer: Medicare Other | Source: Ambulatory Visit | Admitting: Radiology

## 2023-03-05 DIAGNOSIS — Z1231 Encounter for screening mammogram for malignant neoplasm of breast: Secondary | ICD-10-CM

## 2023-03-18 ENCOUNTER — Telehealth: Payer: Self-pay | Admitting: Neurology

## 2023-03-18 NOTE — Telephone Encounter (Signed)
Pt said have had a migraine for 4 days,  no sleep, last seizure on Monday. Taking medication as prescribes. Pt decided to schedule 04/29/23 at 8:30 am. Offered patient appt for today, she could not come due to no one to bring her.

## 2023-03-18 NOTE — Telephone Encounter (Signed)
Called LVM for pt to call back to discuss Migraine for 4 days and recent seizures

## 2023-03-25 ENCOUNTER — Telehealth: Payer: Self-pay | Admitting: Pharmacy Technician

## 2023-03-25 ENCOUNTER — Other Ambulatory Visit (HOSPITAL_COMMUNITY): Payer: Self-pay

## 2023-03-25 NOTE — Telephone Encounter (Signed)
 Pharmacy Patient Advocate Encounter   Received notification from CoverMyMeds that prior authorization for Nurtec 75MG  dispersible tablets is required/requested.   Insurance verification completed.   The patient is insured through [facility] .   Per test claim: The current 30 day co-pay is, $12.15.  No PA needed at this time. This test claim was processed through [facility]- copay amounts may vary at other pharmacies due to pharmacy/plan contracts, or as the patient moves through the different stages of their insurance plan.

## 2023-04-12 ENCOUNTER — Telehealth: Payer: Self-pay | Admitting: Neurology

## 2023-04-12 ENCOUNTER — Ambulatory Visit: Payer: Medicare Other | Admitting: Neurology

## 2023-04-12 ENCOUNTER — Encounter: Payer: Self-pay | Admitting: Neurology

## 2023-04-12 VITALS — BP 130/91 | HR 84 | Ht 63.0 in | Wt 170.6 lb

## 2023-04-12 DIAGNOSIS — R29898 Other symptoms and signs involving the musculoskeletal system: Secondary | ICD-10-CM | POA: Insufficient documentation

## 2023-04-12 DIAGNOSIS — S069X5S Unspecified intracranial injury with loss of consciousness greater than 24 hours with return to pre-existing conscious level, sequela: Secondary | ICD-10-CM

## 2023-04-12 DIAGNOSIS — G40209 Localization-related (focal) (partial) symptomatic epilepsy and epileptic syndromes with complex partial seizures, not intractable, without status epilepticus: Secondary | ICD-10-CM | POA: Insufficient documentation

## 2023-04-12 DIAGNOSIS — G43711 Chronic migraine without aura, intractable, with status migrainosus: Secondary | ICD-10-CM | POA: Diagnosis not present

## 2023-04-12 MED ORDER — NURTEC 75 MG PO TBDP: ORAL_TABLET | ORAL | 5 refills | Status: AC

## 2023-04-12 MED ORDER — PROMETHAZINE HCL 12.5 MG PO TABS: 12.5000 mg | ORAL_TABLET | Freq: Three times a day (TID) | ORAL | 2 refills | Status: AC | PRN

## 2023-04-12 MED ORDER — PREDNISONE 10 MG (21) PO TBPK: ORAL_TABLET | Freq: Every day | ORAL | 0 refills | Status: AC

## 2023-04-12 MED ORDER — ALPRAZOLAM 1 MG PO TABS: 1.0000 mg | ORAL_TABLET | Freq: Every evening | ORAL | 0 refills | Status: AC | PRN

## 2023-04-12 MED ORDER — EMGALITY 120 MG/ML ~~LOC~~ SOAJ: SUBCUTANEOUS | 5 refills | Status: AC

## 2023-04-12 MED ORDER — NURTEC 75 MG PO TBDP: 75.0000 mg | ORAL_TABLET | ORAL | 1 refills | Status: AC | PRN

## 2023-04-12 NOTE — Telephone Encounter (Signed)
 [employer] medicare NPR sent to GI [phone]

## 2023-04-12 NOTE — Addendum Note (Signed)
 Addended by: Melvyn Novas on: 04/12/2023 03:50 PM   Modules accepted: Orders
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
## 2023-04-13 ENCOUNTER — Encounter: Payer: Self-pay | Admitting: Neurology

## 2023-04-13 LAB — COMPREHENSIVE METABOLIC PANEL
ALT: 24 [IU]/L (ref 0–32)
AST: 19 [IU]/L (ref 0–40)
Albumin: 4.4 g/dL (ref 3.9–4.9)
Alkaline Phosphatase: 89 [IU]/L (ref 44–121)
BUN/Creatinine Ratio: 23 (ref 9–23)
BUN: 17 mg/dL (ref 6–24)
Bilirubin Total: 0.2 mg/dL (ref 0.0–1.2)
CO2: 22 mmol/L (ref 20–29)
Calcium: 9.6 mg/dL (ref 8.7–10.2)
Chloride: 107 mmol/L — ABNORMAL HIGH (ref 96–106)
Creatinine, Ser: 0.74 mg/dL (ref 0.57–1.00)
Globulin, Total: 2.3 g/dL (ref 1.5–4.5)
Glucose: 72 mg/dL (ref 70–99)
Potassium: 4.3 mmol/L (ref 3.5–5.2)
Sodium: 145 mmol/L — ABNORMAL HIGH (ref 134–144)
Total Protein: 6.7 g/dL (ref 6.0–8.5)
eGFR: 100 mL/min/{1.73_m2} (ref >59)

## 2023-04-13 LAB — CBC
Hematocrit: 45.7 % (ref 34.0–46.6)
Hemoglobin: 15.3 g/dL (ref 11.1–15.9)
MCH: 31.3 pg (ref 26.6–33.0)
MCHC: 33.5 g/dL (ref 31.5–35.7)
MCV: 94 fL (ref 79–97)
Platelets: 232 10*3/uL (ref 150–450)
RBC: 4.89 x10E6/uL (ref 3.77–5.28)
RDW: 12.5 % (ref 11.7–15.4)
WBC: 8.1 10*3/uL (ref 3.4–10.8)

## 2023-04-14 NOTE — Telephone Encounter (Signed)
 Called pt to verify if see Dr Vickey Huger note. Per Dr. Vickey Huger "Normal liver and kidney tests results, a bit dehydrated perhaps, keep drinking water ! We go ahead with the MRI orders. " Pt states she understand and will be waiting for a call to schedule her MRI. Pt had no questions at this time but was encouraged to call back if questions arise.

## 2023-04-29 ENCOUNTER — Ambulatory Visit: Payer: Medicare Other | Admitting: Neurology

## 2023-05-15 ENCOUNTER — Other Ambulatory Visit: Payer: Medicare Other

## 2023-06-12 ENCOUNTER — Encounter (HOSPITAL_COMMUNITY): Payer: Self-pay

## 2023-06-12 ENCOUNTER — Ambulatory Visit (HOSPITAL_COMMUNITY): Admission: EM | Admit: 2023-06-12 | Discharge: 2023-06-12 | Disposition: A | Discharge status: Home / Self Care

## 2023-06-12 DIAGNOSIS — W540XXA Bitten by dog, initial encounter: Secondary | ICD-10-CM | POA: Diagnosis not present

## 2023-06-12 DIAGNOSIS — S41152A Open bite of left upper arm, initial encounter: Secondary | ICD-10-CM | POA: Diagnosis not present

## 2023-06-12 DIAGNOSIS — Z23 Encounter for immunization: Secondary | ICD-10-CM

## 2023-06-12 MED ORDER — AMOXICILLIN-POT CLAVULANATE 875-125 MG PO TABS: 1.0000 | ORAL_TABLET | Freq: Two times a day (BID) | ORAL | 0 refills | Status: AC

## 2023-06-12 MED ORDER — TETANUS-DIPHTH-ACELL PERTUSSIS 5-2.5-18.5 LF-MCG/0.5 IM SUSY
0.5000 mL | PREFILLED_SYRINGE | Freq: Once | INTRAMUSCULAR | Status: AC
  Administered 2023-06-12: 0.5 mL via INTRAMUSCULAR

## 2023-06-12 MED ORDER — NAPROXEN 500 MG PO TABS: 500.0000 mg | ORAL_TABLET | Freq: Two times a day (BID) | ORAL | 0 refills | Status: AC

## 2023-06-12 MED ORDER — TETANUS-DIPHTH-ACELL PERTUSSIS 5-2.5-18.5 LF-MCG/0.5 IM SUSY
PREFILLED_SYRINGE | INTRAMUSCULAR | Status: AC
  Filled 2023-06-12: qty 0.5
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
## 2023-06-12 NOTE — Discharge Instructions (Addendum)
  1. Dog bite, initial encounter (Primary) - Wound care (Clean Wound) with normal saline - Apply dressing with bacitracin ointment - amoxicillin-clavulanate (AUGMENTIN) 875-125 MG tablet; Take 1 tablet by mouth every 12 (twelve) hours.  Dispense: 14 tablet; Refill: 0 - naproxen  (NAPROSYN ) 500 MG tablet; Take 1 tablet (500 mg total) by mouth 2 (two) times daily.  Dispense: 30 tablet; Refill: 0 - Tdap (BOOSTRIX) injection 0.5 mL given in UC for tetanus prevention. -Continue to monitor symptoms for any change in severity if there is any escalation of current symptoms or development of new symptoms follow-up in ER for further evaluation and management.

## 2023-06-12 NOTE — ED Triage Notes (Signed)
 Dog bite to left upper arm. Patient started the dog is fully vaccinated. Last tetanus is unknown.

## 2023-06-13 ENCOUNTER — Inpatient Hospital Stay: Admission: RE | Admit: 2023-06-13 | Source: Ambulatory Visit

## 2024-03-22 ENCOUNTER — Encounter: Payer: Self-pay | Admitting: Neurology
